# Patient Record
Sex: Female | Born: 1970
Health system: Southern US, Community
[De-identification: ages and names within clinical notes are randomized; demographics above are authoritative.]

## PROBLEM LIST (undated history)

## (undated) DIAGNOSIS — K200 Eosinophilic esophagitis: Secondary | ICD-10-CM

## (undated) DIAGNOSIS — C4490 Unspecified malignant neoplasm of skin, unspecified: Secondary | ICD-10-CM

## (undated) DIAGNOSIS — N809 Endometriosis, unspecified: Secondary | ICD-10-CM

## (undated) DIAGNOSIS — K589 Irritable bowel syndrome without diarrhea: Secondary | ICD-10-CM

## (undated) DIAGNOSIS — G8929 Other chronic pain: Secondary | ICD-10-CM

## (undated) DIAGNOSIS — K219 Gastro-esophageal reflux disease without esophagitis: Secondary | ICD-10-CM

## (undated) DIAGNOSIS — I2510 Atherosclerotic heart disease of native coronary artery without angina pectoris: Secondary | ICD-10-CM

## (undated) DIAGNOSIS — J189 Pneumonia, unspecified organism: Secondary | ICD-10-CM

## (undated) DIAGNOSIS — Z8619 Personal history of other infectious and parasitic diseases: Secondary | ICD-10-CM

## (undated) DIAGNOSIS — M549 Dorsalgia, unspecified: Secondary | ICD-10-CM

## (undated) DIAGNOSIS — Z9889 Other specified postprocedural states: Secondary | ICD-10-CM

## (undated) DIAGNOSIS — K449 Diaphragmatic hernia without obstruction or gangrene: Secondary | ICD-10-CM

## (undated) DIAGNOSIS — R112 Nausea with vomiting, unspecified: Secondary | ICD-10-CM

## (undated) DIAGNOSIS — Z800 Family history of malignant neoplasm of digestive organs: Secondary | ICD-10-CM

## (undated) DIAGNOSIS — F32A Depression, unspecified: Secondary | ICD-10-CM

## (undated) DIAGNOSIS — F419 Anxiety disorder, unspecified: Secondary | ICD-10-CM

## (undated) DIAGNOSIS — D649 Anemia, unspecified: Secondary | ICD-10-CM

## (undated) DIAGNOSIS — Z87442 Personal history of urinary calculi: Secondary | ICD-10-CM

## (undated) DIAGNOSIS — C4492 Squamous cell carcinoma of skin, unspecified: Secondary | ICD-10-CM

## (undated) DIAGNOSIS — K5909 Other constipation: Secondary | ICD-10-CM

## (undated) DIAGNOSIS — R519 Headache, unspecified: Secondary | ICD-10-CM

## (undated) DIAGNOSIS — F329 Major depressive disorder, single episode, unspecified: Secondary | ICD-10-CM

## (undated) HISTORY — DX: Gastro-esophageal reflux disease without esophagitis: K21.9

## (undated) HISTORY — DX: Irritable bowel syndrome, unspecified: K58.9

## (undated) HISTORY — DX: Eosinophilic esophagitis: K20.0

## (undated) HISTORY — PX: CHOLECYSTECTOMY: SHX55

## (undated) HISTORY — DX: Endometriosis, unspecified: N80.9

## (undated) HISTORY — DX: Depression, unspecified: F32.A

## (undated) HISTORY — DX: Family history of malignant neoplasm of digestive organs: Z80.0

## (undated) HISTORY — DX: Squamous cell carcinoma of skin, unspecified: C44.92

## (undated) HISTORY — PX: DILATION AND CURETTAGE, DIAGNOSTIC / THERAPEUTIC: SUR384

## (undated) HISTORY — DX: Major depressive disorder, single episode, unspecified: F32.9

## (undated) HISTORY — DX: Other chronic pain: G89.29

## (undated) HISTORY — DX: Dorsalgia, unspecified: M54.9

## (undated) HISTORY — PX: BACK SURGERY: SHX140

## (undated) HISTORY — DX: Other constipation: K59.09

## (undated) HISTORY — PX: LUMBAR FUSION: SHX111

## (undated) HISTORY — PX: PLACEMENT OF BREAST IMPLANTS: SHX6334

## (undated) HISTORY — PX: LAPAROSCOPY: SHX197

## (undated) HISTORY — DX: Personal history of other infectious and parasitic diseases: Z86.19

## (undated) HISTORY — DX: Diaphragmatic hernia without obstruction or gangrene: K44.9

## (undated) HISTORY — DX: Anxiety disorder, unspecified: F41.9

---

## 1997-12-13 ENCOUNTER — Ambulatory Visit (HOSPITAL_COMMUNITY): Admission: RE | Admit: 1997-12-13 | Discharge: 1997-12-13 | Payer: Self-pay | Admitting: Gastroenterology

## 1997-12-17 ENCOUNTER — Encounter: Admission: RE | Admit: 1997-12-17 | Discharge: 1998-03-17 | Payer: Self-pay | Admitting: Gastroenterology

## 1997-12-31 ENCOUNTER — Other Ambulatory Visit: Admission: RE | Admit: 1997-12-31 | Discharge: 1997-12-31 | Payer: Self-pay | Admitting: Obstetrics and Gynecology

## 1998-03-06 ENCOUNTER — Inpatient Hospital Stay (HOSPITAL_COMMUNITY): Admission: AD | Admit: 1998-03-06 | Discharge: 1998-03-06 | Payer: Self-pay | Admitting: Obstetrics & Gynecology

## 1998-05-27 ENCOUNTER — Encounter: Admission: RE | Admit: 1998-05-27 | Discharge: 1998-08-25 | Payer: Self-pay | Admitting: Gastroenterology

## 1999-04-14 ENCOUNTER — Other Ambulatory Visit: Admission: RE | Admit: 1999-04-14 | Discharge: 1999-04-14 | Payer: Self-pay | Admitting: Obstetrics and Gynecology

## 1999-06-27 ENCOUNTER — Encounter (INDEPENDENT_AMBULATORY_CARE_PROVIDER_SITE_OTHER): Payer: Self-pay | Admitting: *Deleted

## 1999-06-27 ENCOUNTER — Ambulatory Visit (HOSPITAL_BASED_OUTPATIENT_CLINIC_OR_DEPARTMENT_OTHER): Admission: RE | Admit: 1999-06-27 | Discharge: 1999-06-27 | Payer: Self-pay | Admitting: General Surgery

## 1999-10-28 ENCOUNTER — Emergency Department (HOSPITAL_COMMUNITY): Admission: EM | Admit: 1999-10-28 | Discharge: 1999-10-28 | Payer: Self-pay | Admitting: Emergency Medicine

## 2000-05-07 HISTORY — PX: CHOLECYSTECTOMY: SHX55

## 2000-12-23 ENCOUNTER — Other Ambulatory Visit: Admission: RE | Admit: 2000-12-23 | Discharge: 2000-12-23 | Payer: Self-pay | Admitting: Obstetrics and Gynecology

## 2000-12-27 ENCOUNTER — Ambulatory Visit (HOSPITAL_COMMUNITY): Admission: RE | Admit: 2000-12-27 | Discharge: 2000-12-27 | Payer: Self-pay | Admitting: Gastroenterology

## 2000-12-27 ENCOUNTER — Encounter (INDEPENDENT_AMBULATORY_CARE_PROVIDER_SITE_OTHER): Payer: Self-pay | Admitting: Specialist

## 2001-01-08 ENCOUNTER — Encounter: Payer: Self-pay | Admitting: Gastroenterology

## 2001-01-08 ENCOUNTER — Ambulatory Visit (HOSPITAL_COMMUNITY): Admission: RE | Admit: 2001-01-08 | Discharge: 2001-01-08 | Payer: Self-pay | Admitting: Gastroenterology

## 2002-01-13 ENCOUNTER — Encounter: Payer: Self-pay | Admitting: Gastroenterology

## 2002-01-13 ENCOUNTER — Ambulatory Visit (HOSPITAL_COMMUNITY): Admission: RE | Admit: 2002-01-13 | Discharge: 2002-01-13 | Payer: Self-pay | Admitting: Gastroenterology

## 2002-01-21 ENCOUNTER — Encounter: Payer: Self-pay | Admitting: Surgery

## 2002-01-22 ENCOUNTER — Encounter: Payer: Self-pay | Admitting: Surgery

## 2002-01-22 ENCOUNTER — Encounter (INDEPENDENT_AMBULATORY_CARE_PROVIDER_SITE_OTHER): Payer: Self-pay | Admitting: Specialist

## 2002-01-22 ENCOUNTER — Observation Stay (HOSPITAL_COMMUNITY): Admission: RE | Admit: 2002-01-22 | Discharge: 2002-01-23 | Payer: Self-pay | Admitting: Surgery

## 2002-06-09 ENCOUNTER — Other Ambulatory Visit: Admission: RE | Admit: 2002-06-09 | Discharge: 2002-06-09 | Payer: Self-pay | Admitting: Obstetrics and Gynecology

## 2004-01-04 ENCOUNTER — Ambulatory Visit (HOSPITAL_COMMUNITY): Admission: RE | Admit: 2004-01-04 | Discharge: 2004-01-04 | Payer: Self-pay | Admitting: Obstetrics and Gynecology

## 2004-11-25 ENCOUNTER — Ambulatory Visit (HOSPITAL_COMMUNITY): Admission: RE | Admit: 2004-11-25 | Discharge: 2004-11-25 | Payer: Self-pay | Admitting: Specialist

## 2005-01-27 ENCOUNTER — Emergency Department (HOSPITAL_COMMUNITY): Admission: EM | Admit: 2005-01-27 | Discharge: 2005-01-27 | Payer: Self-pay | Admitting: Emergency Medicine

## 2005-02-27 ENCOUNTER — Other Ambulatory Visit: Admission: RE | Admit: 2005-02-27 | Discharge: 2005-02-27 | Payer: Self-pay | Admitting: Gynecology

## 2005-07-06 ENCOUNTER — Ambulatory Visit (HOSPITAL_COMMUNITY): Admission: RE | Admit: 2005-07-06 | Discharge: 2005-07-06 | Payer: Self-pay | Admitting: Gynecology

## 2005-07-20 ENCOUNTER — Ambulatory Visit (HOSPITAL_BASED_OUTPATIENT_CLINIC_OR_DEPARTMENT_OTHER): Admission: RE | Admit: 2005-07-20 | Discharge: 2005-07-20 | Payer: Self-pay | Admitting: Obstetrics and Gynecology

## 2005-07-20 ENCOUNTER — Encounter (INDEPENDENT_AMBULATORY_CARE_PROVIDER_SITE_OTHER): Payer: Self-pay | Admitting: *Deleted

## 2005-07-27 ENCOUNTER — Inpatient Hospital Stay (HOSPITAL_COMMUNITY): Admission: AD | Admit: 2005-07-27 | Discharge: 2005-07-27 | Payer: Self-pay | Admitting: Obstetrics and Gynecology

## 2005-09-14 ENCOUNTER — Ambulatory Visit (HOSPITAL_COMMUNITY): Admission: RE | Admit: 2005-09-14 | Discharge: 2005-09-14 | Payer: Self-pay | Admitting: Obstetrics and Gynecology

## 2005-09-14 ENCOUNTER — Encounter (INDEPENDENT_AMBULATORY_CARE_PROVIDER_SITE_OTHER): Payer: Self-pay | Admitting: Specialist

## 2005-10-03 IMAGING — US US PELVIS COMPLETE MODIFY
1 series · 18 of 25 positions shown · non-contrast
Comparison: none

CLINICAL DATA: Evaluate for left ovarian cyst.  Heavy bleeding for 20 days.  History of ovarian cysts and endometriosis. 
TRANSABDOMINAL AND TRANSVAGINAL PELVIC ULTRASOUND 
Transabdominal and transvaginal scanning of the pelvis was performed.  The uterus is normal in size measuring 8.4 cm in length, 3.4 cm in depth, and 5.5 cm in width.  The myometrium is homogeneous.  The endometrium is normal at 3 mm.
Right ovary is normal measuring 2.3 x 1.2 x 1.5 cm.  Left ovary contains a simple cyst measuring approximately 2.9 x 2.8 x 2.7 cm.  Adjacent to this is a second simple cyst that could be a daughter cyst measuring 1.3 x 1.4 x 1.1 cm.  There are a few tiny daughter cysts along the wall of the larger cyst.  No internal echoes are noted.  No thick septations are seen.
IMPRESSION
Normal uterus and right ovary.  Several simple cysts in the left ovary as described above.

[Series 1: us pelvis complete modify · 18 of 74 slices shown]
[im 1/74]
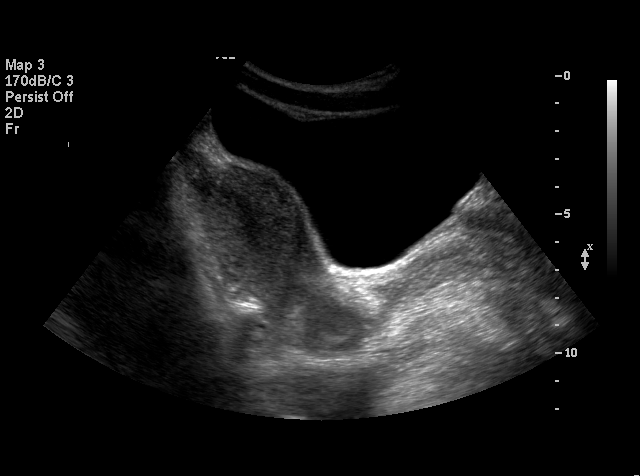
[im 7/74]
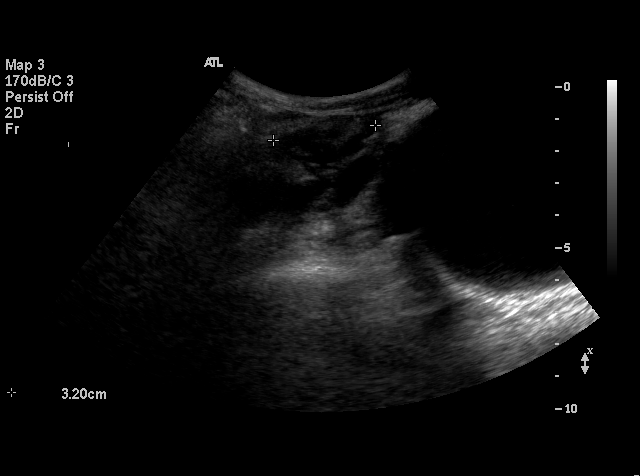
[im 10/74]
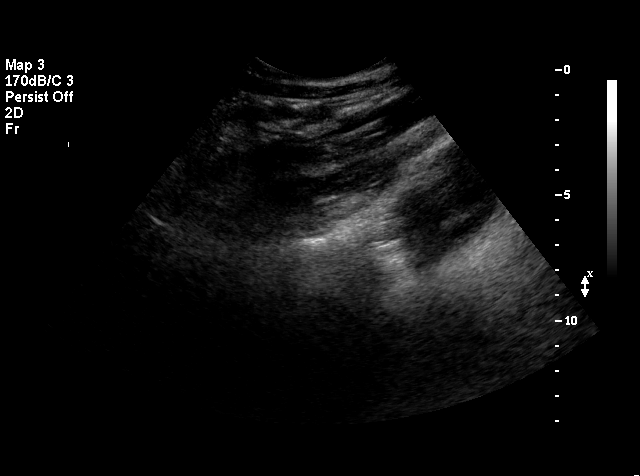
[im 13/74]
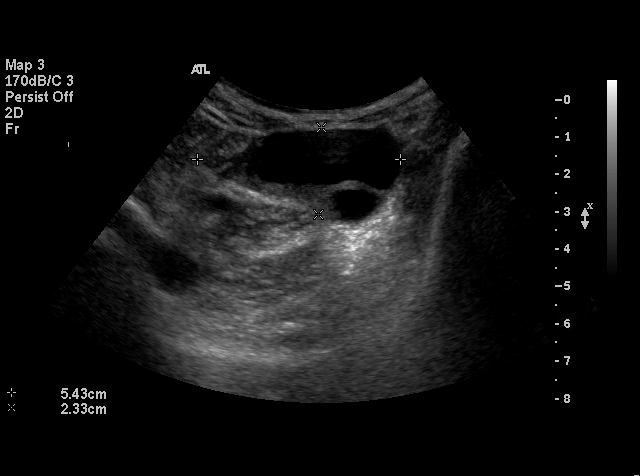
[im 19/74]
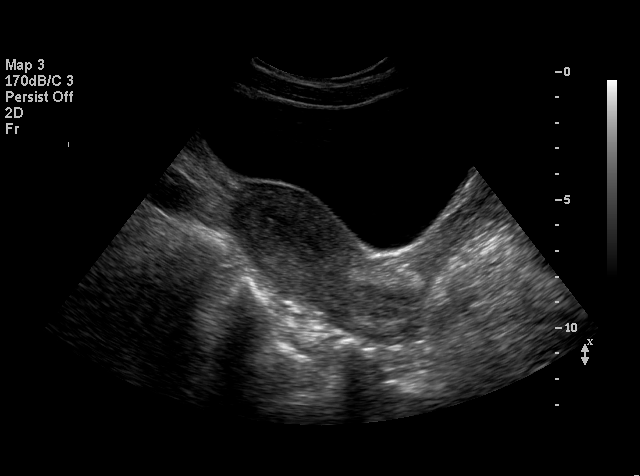
[im 22/74]
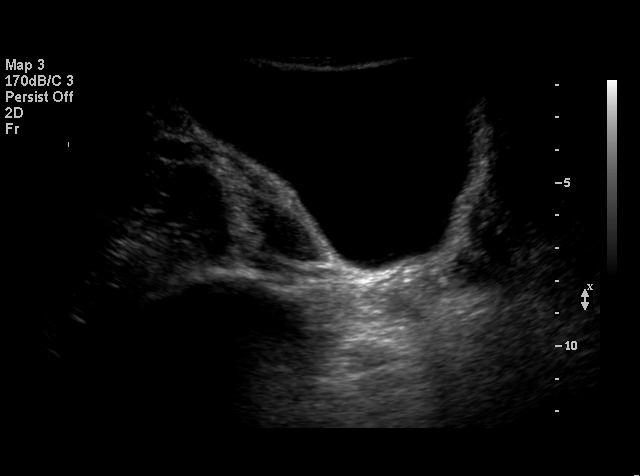
[im 28/74]
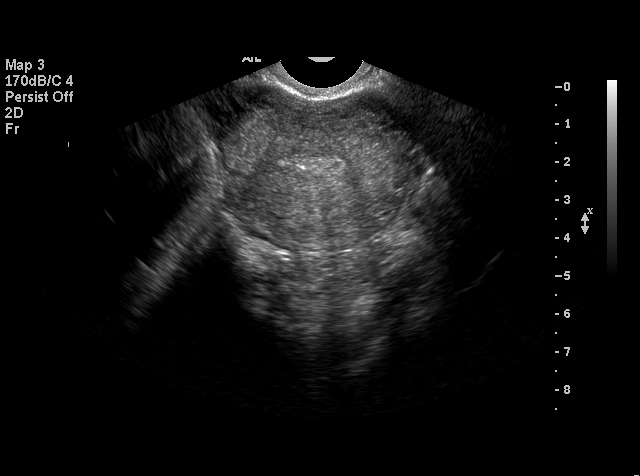
[im 31/74]
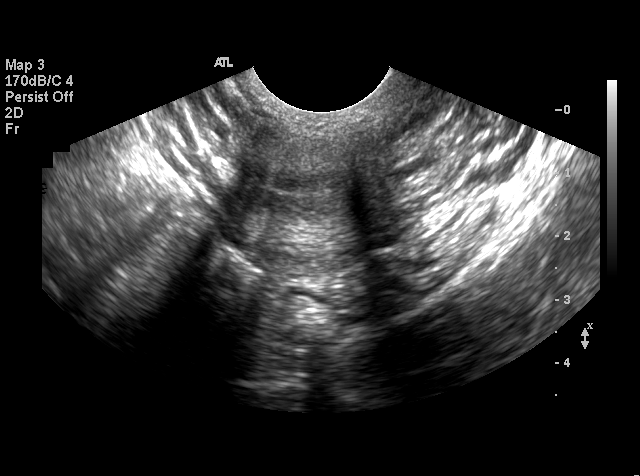
[im 34/74]
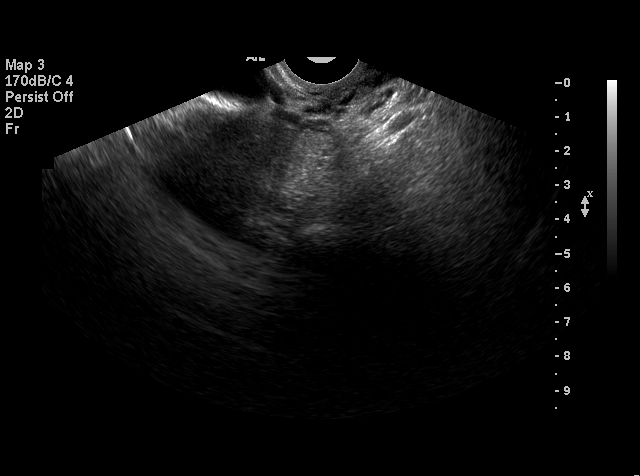
[im 40/74]
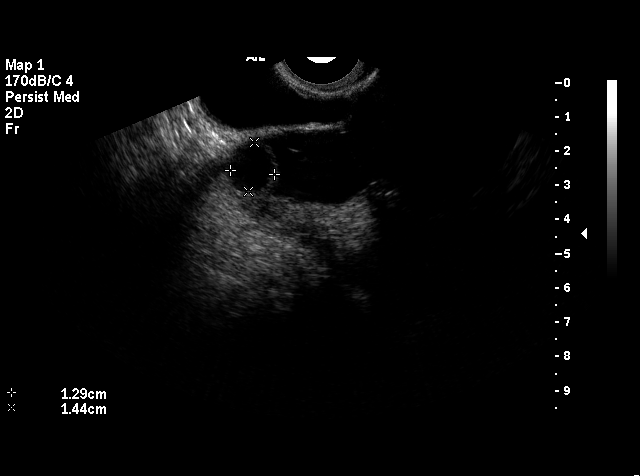
[im 43/74]
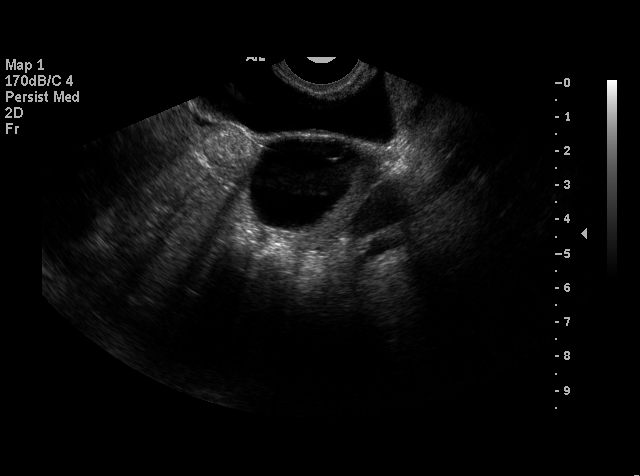
[im 46/74]
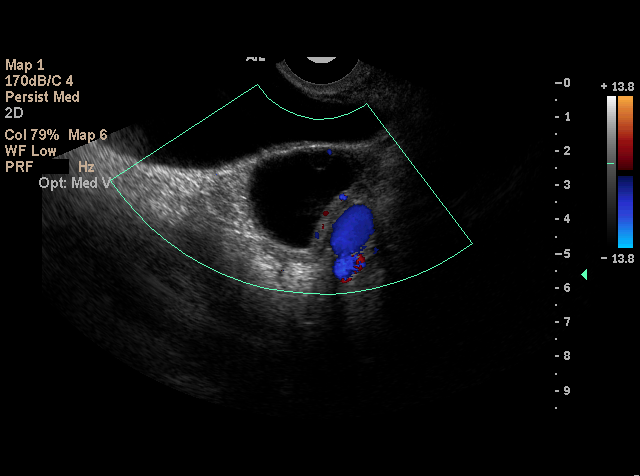
[im 52/74]
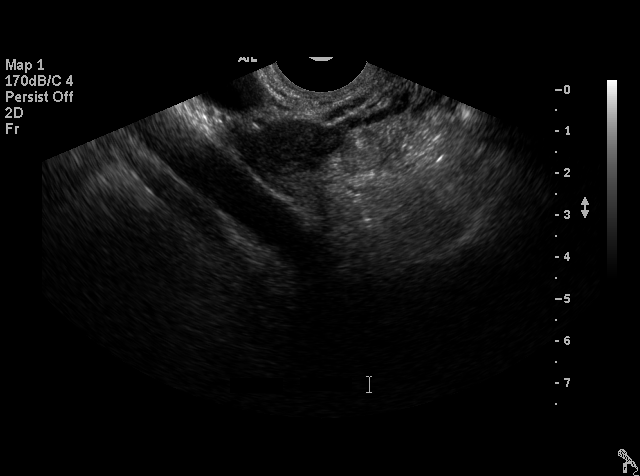
[im 55/74]
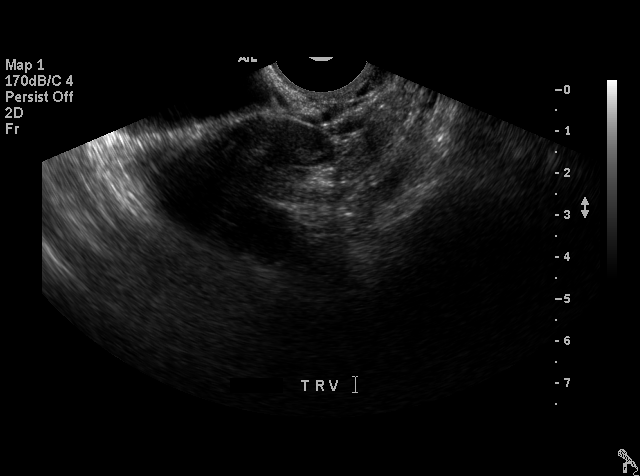
[im 61/74]
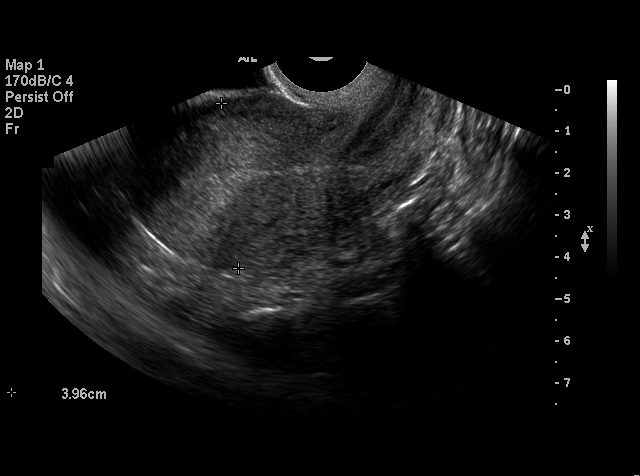
[im 64/74]
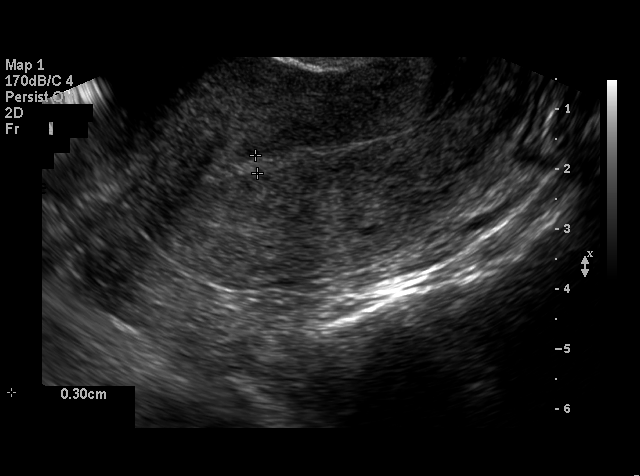
[im 67/74]
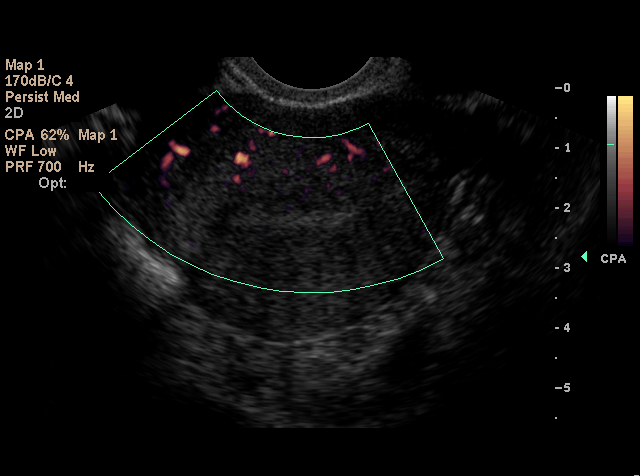
[im 74/74]
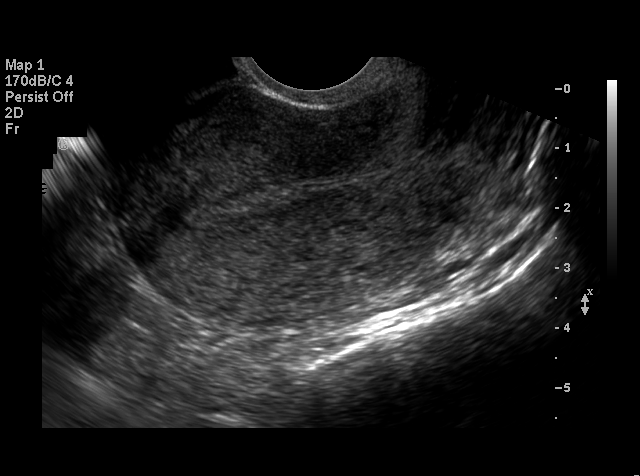

[18 of 25 positions shown; findings below may reference images not displayed]

## 2007-09-01 ENCOUNTER — Encounter (INDEPENDENT_AMBULATORY_CARE_PROVIDER_SITE_OTHER): Payer: Self-pay | Admitting: Obstetrics and Gynecology

## 2007-09-01 ENCOUNTER — Ambulatory Visit (HOSPITAL_COMMUNITY): Admission: RE | Admit: 2007-09-01 | Discharge: 2007-09-01 | Payer: Self-pay | Admitting: Obstetrics and Gynecology

## 2008-08-17 ENCOUNTER — Encounter: Admission: RE | Admit: 2008-08-17 | Discharge: 2008-08-17 | Payer: Self-pay | Admitting: Specialist

## 2008-09-03 ENCOUNTER — Encounter: Admission: RE | Admit: 2008-09-03 | Discharge: 2008-09-03 | Payer: Self-pay | Admitting: Specialist

## 2008-09-23 ENCOUNTER — Encounter: Admission: RE | Admit: 2008-09-23 | Discharge: 2008-09-23 | Payer: Self-pay | Admitting: Specialist

## 2010-05-28 ENCOUNTER — Encounter: Payer: Self-pay | Admitting: Gynecology

## 2010-05-28 ENCOUNTER — Encounter: Payer: Self-pay | Admitting: Specialist

## 2010-09-19 NOTE — Op Note (Signed)
[name], [name] NO.:  [id number]   MEDICAL RECORD NO.:  [id number]          PATIENT TYPE:  AMB   LOCATION:  SDC                           FACILITY:  [facility]   PHYSICIAN:  Malva Limes, M.D.    DATE OF BIRTH:  04-20-1971   DATE OF PROCEDURE:  09/01/2007  DATE OF DISCHARGE:                               OPERATIVE REPORT   PREOPERATIVE DIAGNOSIS:  Menometrorrhagia.   POSTOPERATIVE DIAGNOSIS:  Menometrorrhagia.   PROCEDURE:  Dilation and curettage.   SURGEON:  Malva Limes, M.D.   ANESTHESIA:  MAC with paracervical block.   DRAINS:  None.   ANTIBIOTIC:  Ancef 1 gm.   SPECIMENS:  Endometrial curettings sent to pathology.   COMPLICATIONS:  None.   ESTIMATED BLOOD LOSS:  Minimal.   DESCRIPTION OF PROCEDURE:  The patient was taken to the operating room  where she was placed in the dorsal lithotomy position.  A MAC anesthesia  was administered without complication.  She was then prepped and draped  in the usual fashion for this procedure.  Ten mL of 1% lidocaine was  used for a paracervical block.  The cervix was serially dilated to a 76-  [place].  The uterus was sounded to 8 cm.  The patient then had sharp  curettage performed.  The patient had no evidence of a polyp or  excessive tissue.  The patient was taken to the recovery room in stable  condition.  Instrument and lap counts were correct x1.           ______________________________  Malva Limes, M.D.     MA/MEDQ  D:  09/01/2007  T:  09/01/2007  Job:  [id number]

## 2010-09-22 NOTE — Op Note (Signed)
NAME:  Lisa [name], Lisa [name]                         ACCOUNT NO.:  [id number]   MEDICAL RECORD NO.:  [id number]                   PATIENT TYPE:  OBV   LOCATION:  0344                                 FACILITY:  [facility]   PHYSICIAN:  Douglas A. Magnus Ivan, M.D.           DATE OF BIRTH:  1971-02-20   DATE OF PROCEDURE:  DATE OF DISCHARGE:                                 OPERATIVE REPORT   PREOPERATIVE DIAGNOSIS:  Biliary diskinesia.   POSTOPERATIVE DIAGNOSIS:  Biliary diskinesia.   PROCEDURE:  Laparoscopic cholecystectomy with intraoperative cholangiogram.   SURGEON:  Douglas A. Magnus Ivan, M.D.   ASSISTANT:  Gabrielle Dare. Janee Morn, M.D.   ANESTHESIA:  General endotracheal.   FINDINGS:  The patient was found to have a normal cholangiogram.   DESCRIPTION OF PROCEDURE:  The patient was brought to the operating room,  identified as [name]. She was placed supine on the operating room  table and general anesthesia was induced. Her abdomen was then prepped and  draped in the usual sterile fashion. Using a #15 blade, a small transverse  incision was made below the umbilicus. The incision was carried down through  the fascia which was then opened with the scalpel. A hemostat was then used  to pass through the peritoneal cavity. A #0 Vicryl pursestring suture was  then placed around the fascial opening. The Hasson port was placed through  the opening and insufflation of the abdomen was begun. Next a 12 mm port was  placed in the patient's epigastrium, two 5 mm ports were placed in the  patient's right flank under direct vision. The gallbladder was then  identified and retracted above the liver bed. Dissection was then carried  out at the base of the gallbladder. The cystic duct was dissected out and  clipped once distally. It was then partly transected with the scissors. A  cholangiocath was then inserted through an Angiocath into the right upper  quadrant under direct vision. The cholangiocath  was then placed into the  open cystic duct. A cholangiogram was then performed under fluoroscopy.  Excellent flow of contrast was seen into the common bile duct up into the  proximal biliary radicles and into the duodenum without evidence of  abnormality. The cholangiocatheter was then removed and the cystic duct was  then clipped three times proximally and transected with the scissors. The  cystic artery was then identified, clipped twice proximally, once distally  and transected as well. The gallbladder was then dissected free from the  liver bed with the electrocautery. Hemostasis was achieved in the bed with  the cautery. The gallbladder was then completely excised and removed through  the incision at the umbilicus. The #0 at the umbilicus was tied closed  closing the fascial defect. A separate figure-of-eight #0 Vicryl suture was  also placed at the umbilical fascial defect. All ports were then removed  under direct vision and the abdomen was deflated. All  incisions were then  anesthetized with 0.25% Marcaine and closed with 4-0 Monocryl subcuticular  sutures. Steri-  Strips, gauze and tape were then applied. The patient tolerated the  procedure well. All sponge, needle and instrument counts were correct at the  end of the procedure. The patient was then extubated in the operating room  and taken in stable condition to the recovery room.                                               Douglas A. Magnus Ivan, M.D.    DAB/MEDQ  D:  01/22/2002  T:  01/22/2002  Job:  [id number]

## 2010-09-22 NOTE — Op Note (Signed)
[name], [name] NO.:  [id number]   MEDICAL RECORD NO.:  [id number]          PATIENT TYPE:  AMB   LOCATION:  [facility]                         FACILITY:  [facility]   PHYSICIAN:  Malva Limes, M.D.    DATE OF BIRTH:  01-11-1971   DATE OF PROCEDURE:  07/20/2005  DATE OF DISCHARGE:  07/20/2005                                 OPERATIVE REPORT   PREOPERATIVE DIAGNOSIS:  Painful ovarian cyst measuring 4 cm.   POSTOPERATIVE DIAGNOSIS:  Painful ovarian cyst measuring 4 cm.   PROCEDURE:  Diagnostic laparoscopy with left ovarian cystectomy.   SURGEON:  Malva Limes, M.D.   ANESTHESIA:  General endotracheal.   ANTIBIOTICS:  Ancef 1 gram.   DRAINS:  None.   ESTIMATED BLOOD LOSS:  Minimal.   COMPLICATIONS:  None.   SPECIMENS:  Cyst wall sent to pathology.   DESCRIPTION OF PROCEDURE:  The patient was taken to the operating room where  she was placed in dorsal supine position. A general anesthetic was  administered without complications. She was then placed in the dorsal  lithotomy position. She was prepped with Betadine and draped in the usual  fashion for this procedure. A Hulka tenaculum was applied to the anterior  cervical lip. The umbilicus was then injected with 0.25% Marcaine, a  vertical skin incision was made through the previous scar. The fascia was  grasped with Kocher's, entered with Mayo scissors, a parietoperitoneum was  entered sharply. An #0 Vicryl suture was placed in a pursestring fashion,  the Hasson cannula was placed into the peritoneal cavity, 3 liters of carbon  dioxide was insufflated. The patient was then placed in Trendelenburg. 5 mm  ports were placed under direct visualization in the left lower quadrant and  midline. At this point, the examination of the abdominal contents revealed  no evidence of endometriosis or adhesions. The patient had normal fallopian  tubes bilaterally, the uterus appeared to be normal, there was no evidence  of  endometriosis. The right ovary was normal, the left ovary had a 4-5 cm  cyst. At this point, the left ovarian ligament was grasped and incised with  a needle. Once this was performed, the ovarian cyst was shelled out;  however, during this procedure the cyst was ruptured and the cyst wall was  removed. The fluid was clear yellow serosanguineous fluid. There was no  evidence of any abnormalities on the cyst lining. There was no nodules  anywhere. Once the cyst wall was removed, the remaining pelvis again checked  and found to have no endometriosis. At this point, the procedure was  concluded. The instruments removed, the trocars removed, pneumoperitoneum  released. The fascia was closed with interrupted #0 Vicryl suture and the  skin with 4-0 Vicryl suture. Dermabond  was used for the 5 mm ports. The patient was extubated and taken to the  recovery room in stable condition. Instrument and lap counts were correct  x1. The patient will be discharged to home. She will followup in the office  in 4 weeks. She was sent home with Percocet to take p.r.n.  ______________________________  Malva Limes, M.D.     MA/MEDQ  D:  07/20/2005  T:  07/23/2005  Job:  [id number]   cc:   Leatha Gilding. Mezer, M.D.  Fax: [phone]

## 2010-09-22 NOTE — Op Note (Signed)
[place]  Patient:    Lisa [name], Lisa [name]                      MRN: [id number] Proc. Date: 06/27/99 Adm. Date:  [id number] Attending:  Janalyn Rouse                           Operative Report  PREOPERATIVE DIAGNOSIS:  Small breast mass, left.  POSTOPERATIVE DIAGNOSIS:  Small breast mass, left.  OPERATION:  Excision of left breast mass.  SURGEON:  Rose Phi. Maple Hudson, M.D.  ANESTHESIA:  General.  DESCRIPTION OF PROCEDURE:  The patient was placed on the operating table with the left arm extended on the arm board.  The left breast was prepped and draped in he usual fashion.  A palpable nodule just under the areola at the 11 oclock position on the left side.  A circumareolar incision was then made and using the cautery, I excised this palpable firm mass of the surrounding normal breast tissue. Hemostasis was obtained with the cautery.  We carefully injected 0.25% Marcaine. The deeper breast tissue was closed with 3-0 Vicryl, and the skin with subcuticular 4-0 Monocryl, and Steri-Strips.  A dressing applied.  The patient was transferred to the recovery room in satisfactory condition having tolerated the procedure well. DD:  06/27/99 TD:  06/28/99 Job: [id number]

## 2010-09-22 NOTE — Procedures (Signed)
[facility]  Patient:    Lisa [name], Lisa [name] Visit Number: [id number] MRN: [id number]          Service Type: END Location: ENDO Attending Physician:  Nelda Marseille Proc. Date: 12/27/00 Adm. Date:  12/27/2000   CC:         Dr. [facility]   Procedure Report  PROCEDURE:  Esophagogastroduodenoscopy with biopsy.  INDICATIONS FOR PROCEDURE:  A patient with multiple GI complaints, increased reflux.  Consent was signed after risks, benefits, methods, and options were thoroughly discussed in the office.  MEDICINES USED:  Demerol 100, Versed 15.  DESCRIPTION OF PROCEDURE:  The video endoscope was inserted by direct vision. The esophagus was normal. There was no signs of Barretts or esophagitis. In the distal esophagus was a very tiny and possibly a small hiatal hernia. The scope passed into the stomach, advanced through a normal antrum, normal pylorus into a normal duodenal bulb and around the C loop to a normal second portion of the duodenum. The scope was withdrawn back to the stomach which was evaluated on retroflexed and straight visualization with a good look at the cardia, fundus, angularis, lesser and greater curve. No signs of stomach abnormalities were seen, specifically no polyps, ulcers, or gastritis. No significant hiatal hernia was seen in the cardia. The scope was then slowly withdrawn back to 20 cm which confirmed the above findings in the esophagus. The scope was then advanced to the stomach and two biopsies of the antrum, two of the proximal stomach were obtained to rule out Helicobacter. We then went ahead and readvanced around the C loop and two biopsies of the duodenum were obtained and put in a second container. We then withdrew slowly back to the esophagus. Air was suctioned from the stomach and a few distal esophageal biopsies were obtained to rule out any microscopic abnormality. The scope was removed. The  patient tolerated the procedure well. There was on obvious or immediate complication.  ENDOSCOPIC DIAGNOSIS: 1. Tiny to small hiatal hernia. 2. Otherwise normal esophagogastroduodenoscopy status post distal esophageal    biopsies, stomach biopsies and duodenal biopsies to rule out any    microscopic abnormality.  PLAN:  Await pathology, continue Aciphex since that is helping, add Levbid. Follow-up p.r.n. or in 6-8 weeks to recheck symptoms and decide any other workup plans. Attending Physician:  Nelda Marseille DD:  12/27/00 TD:  12/27/00 Job: [id number]

## 2010-09-22 NOTE — Op Note (Signed)
Lisa [name], COUVILLON NO.:  [id number]   MEDICAL RECORD NO.:  [id number]          PATIENT TYPE:  AMB   LOCATION:  SDC                           FACILITY:  [facility]   PHYSICIAN:  Malva Limes, M.D.    DATE OF BIRTH:  [birth year]   DATE OF PROCEDURE:  09/14/2005  DATE OF DISCHARGE:                                 OPERATIVE REPORT   PREOPERATIVE DIAGNOSES:  1.  Menorrhagia.  2.  Endometrial polyps seen on sonohistogram.   POSTOPERATIVE DIAGNOSES:  1.  Menorrhagia.  2.  Endometrial polyps seen on sonohistogram.   PROCEDURE:   DILATION AND CURETTAGE SURGEON:  Dr. Dareen Piano.   ANESTHESIA:  MAC with paracervical block.   DRAINS:  None.   ANTIBIOTICS:  Ancef 1 g.   SPECIMENS:  Endometrial curettings to pathology.   ESTIMATED BLOOD LOSS:  Minimal.   COMPLICATIONS:  None.   PROCEDURE:  The patient was taken to the operating room where she was placed  in dorsal lithotomy position.  MAC anesthesia was administered without  complications.  She was then prepped with Betadine and draped in the usual  fashion for this procedure.  A sterile speculum was placed in the vagina.  Twenty cc of 1% lidocaine was used for paracervical block.  Single-tooth  tenaculum was applied to the anterior cervical lip.  The cervical os was  then serially dilated to a 27-French. Sharp curettage was placed into the  uterine cavity, and curettage performed. All quadrants were sharply  curetted.  Moderate amount of tissue was removed.  The patient tolerated the  procedure well.  She was taken to recovery room in stable condition.  Instrument and __________ counts were correct x1.  The patient will be  discharged home.  She will follow up in the office in four weeks.  She will  be given Advil to take as needed.          ______________________________  Malva Limes, M.D.    MA/MEDQ  D:  09/14/2005  T:  09/15/2005  Job:  [id number]

## 2011-01-30 LAB — CBC
HCT: 34.1 — ABNORMAL LOW
RDW: 12.7

## 2011-01-30 LAB — PREGNANCY, URINE: Preg Test, Ur: NEGATIVE

## 2011-07-10 ENCOUNTER — Other Ambulatory Visit: Payer: Self-pay | Admitting: Obstetrics and Gynecology

## 2012-10-11 DIAGNOSIS — F39 Unspecified mood [affective] disorder: Secondary | ICD-10-CM

## 2012-10-11 HISTORY — DX: Unspecified mood (affective) disorder: F39

## 2013-06-09 DIAGNOSIS — M519 Unspecified thoracic, thoracolumbar and lumbosacral intervertebral disc disorder: Secondary | ICD-10-CM | POA: Insufficient documentation

## 2013-06-09 DIAGNOSIS — M259 Joint disorder, unspecified: Secondary | ICD-10-CM | POA: Insufficient documentation

## 2013-06-09 DIAGNOSIS — F329 Major depressive disorder, single episode, unspecified: Secondary | ICD-10-CM

## 2013-06-09 HISTORY — DX: Major depressive disorder, single episode, unspecified: F32.9

## 2013-06-09 HISTORY — DX: Joint disorder, unspecified: M25.9

## 2013-06-09 HISTORY — DX: Unspecified thoracic, thoracolumbar and lumbosacral intervertebral disc disorder: M51.9

## 2013-07-16 ENCOUNTER — Other Ambulatory Visit: Payer: Self-pay | Admitting: Obstetrics and Gynecology

## 2013-08-04 DIAGNOSIS — I499 Cardiac arrhythmia, unspecified: Secondary | ICD-10-CM | POA: Insufficient documentation

## 2013-08-04 DIAGNOSIS — M5416 Radiculopathy, lumbar region: Secondary | ICD-10-CM

## 2013-08-04 DIAGNOSIS — M533 Sacrococcygeal disorders, not elsewhere classified: Secondary | ICD-10-CM | POA: Insufficient documentation

## 2013-08-04 HISTORY — DX: Cardiac arrhythmia, unspecified: I49.9

## 2013-08-04 HISTORY — DX: Radiculopathy, lumbar region: M54.16

## 2013-08-04 HISTORY — DX: Sacrococcygeal disorders, not elsewhere classified: M53.3

## 2013-10-09 DIAGNOSIS — M961 Postlaminectomy syndrome, not elsewhere classified: Secondary | ICD-10-CM

## 2013-10-09 HISTORY — DX: Postlaminectomy syndrome, not elsewhere classified: M96.1

## 2015-03-21 DIAGNOSIS — K5903 Drug induced constipation: Secondary | ICD-10-CM | POA: Insufficient documentation

## 2015-03-21 DIAGNOSIS — T402X5A Adverse effect of other opioids, initial encounter: Secondary | ICD-10-CM

## 2015-03-21 HISTORY — DX: Adverse effect of other opioids, initial encounter: T40.2X5A

## 2015-05-15 DIAGNOSIS — M84321A Stress fracture, right humerus, initial encounter for fracture: Secondary | ICD-10-CM | POA: Diagnosis not present

## 2015-05-16 DIAGNOSIS — S52502A Unspecified fracture of the lower end of left radius, initial encounter for closed fracture: Secondary | ICD-10-CM | POA: Diagnosis not present

## 2015-05-16 DIAGNOSIS — M5137 Other intervertebral disc degeneration, lumbosacral region: Secondary | ICD-10-CM | POA: Diagnosis not present

## 2015-05-16 DIAGNOSIS — M5416 Radiculopathy, lumbar region: Secondary | ICD-10-CM | POA: Diagnosis not present

## 2015-05-16 DIAGNOSIS — M533 Sacrococcygeal disorders, not elsewhere classified: Secondary | ICD-10-CM | POA: Diagnosis not present

## 2015-05-16 DIAGNOSIS — M961 Postlaminectomy syndrome, not elsewhere classified: Secondary | ICD-10-CM | POA: Diagnosis not present

## 2015-05-23 DIAGNOSIS — S52502D Unspecified fracture of the lower end of left radius, subsequent encounter for closed fracture with routine healing: Secondary | ICD-10-CM | POA: Diagnosis not present

## 2015-05-23 DIAGNOSIS — M25532 Pain in left wrist: Secondary | ICD-10-CM | POA: Diagnosis not present

## 2015-05-27 DIAGNOSIS — R197 Diarrhea, unspecified: Secondary | ICD-10-CM | POA: Diagnosis not present

## 2015-05-27 DIAGNOSIS — N912 Amenorrhea, unspecified: Secondary | ICD-10-CM | POA: Diagnosis not present

## 2015-05-27 DIAGNOSIS — F339 Major depressive disorder, recurrent, unspecified: Secondary | ICD-10-CM | POA: Diagnosis not present

## 2015-06-16 DIAGNOSIS — S52502D Unspecified fracture of the lower end of left radius, subsequent encounter for closed fracture with routine healing: Secondary | ICD-10-CM | POA: Diagnosis not present

## 2015-06-21 DIAGNOSIS — N912 Amenorrhea, unspecified: Secondary | ICD-10-CM | POA: Diagnosis not present

## 2015-07-05 DIAGNOSIS — W000XXD Fall on same level due to ice and snow, subsequent encounter: Secondary | ICD-10-CM | POA: Diagnosis not present

## 2015-07-05 DIAGNOSIS — M25631 Stiffness of right wrist, not elsewhere classified: Secondary | ICD-10-CM | POA: Diagnosis not present

## 2015-07-05 DIAGNOSIS — M25431 Effusion, right wrist: Secondary | ICD-10-CM | POA: Diagnosis not present

## 2015-07-05 DIAGNOSIS — M79641 Pain in right hand: Secondary | ICD-10-CM | POA: Diagnosis not present

## 2015-07-05 DIAGNOSIS — S52591D Other fractures of lower end of right radius, subsequent encounter for closed fracture with routine healing: Secondary | ICD-10-CM | POA: Diagnosis not present

## 2015-07-05 DIAGNOSIS — M6281 Muscle weakness (generalized): Secondary | ICD-10-CM | POA: Diagnosis not present

## 2015-07-11 DIAGNOSIS — M6281 Muscle weakness (generalized): Secondary | ICD-10-CM | POA: Diagnosis not present

## 2015-07-11 DIAGNOSIS — M25631 Stiffness of right wrist, not elsewhere classified: Secondary | ICD-10-CM | POA: Diagnosis not present

## 2015-07-11 DIAGNOSIS — W000XXD Fall on same level due to ice and snow, subsequent encounter: Secondary | ICD-10-CM | POA: Diagnosis not present

## 2015-07-11 DIAGNOSIS — M79641 Pain in right hand: Secondary | ICD-10-CM | POA: Diagnosis not present

## 2015-07-11 DIAGNOSIS — S52591D Other fractures of lower end of right radius, subsequent encounter for closed fracture with routine healing: Secondary | ICD-10-CM | POA: Diagnosis not present

## 2015-07-11 DIAGNOSIS — M25431 Effusion, right wrist: Secondary | ICD-10-CM | POA: Diagnosis not present

## 2015-07-12 DIAGNOSIS — M719 Bursopathy, unspecified: Secondary | ICD-10-CM | POA: Insufficient documentation

## 2015-07-12 DIAGNOSIS — M5137 Other intervertebral disc degeneration, lumbosacral region: Secondary | ICD-10-CM | POA: Diagnosis not present

## 2015-07-12 DIAGNOSIS — M533 Sacrococcygeal disorders, not elsewhere classified: Secondary | ICD-10-CM | POA: Diagnosis not present

## 2015-07-12 DIAGNOSIS — M961 Postlaminectomy syndrome, not elsewhere classified: Secondary | ICD-10-CM | POA: Diagnosis not present

## 2015-07-12 HISTORY — DX: Bursopathy, unspecified: M71.9

## 2015-07-13 DIAGNOSIS — M6281 Muscle weakness (generalized): Secondary | ICD-10-CM | POA: Diagnosis not present

## 2015-07-13 DIAGNOSIS — S52591D Other fractures of lower end of right radius, subsequent encounter for closed fracture with routine healing: Secondary | ICD-10-CM | POA: Diagnosis not present

## 2015-07-13 DIAGNOSIS — M25631 Stiffness of right wrist, not elsewhere classified: Secondary | ICD-10-CM | POA: Diagnosis not present

## 2015-07-13 DIAGNOSIS — M25431 Effusion, right wrist: Secondary | ICD-10-CM | POA: Diagnosis not present

## 2015-07-13 DIAGNOSIS — M79641 Pain in right hand: Secondary | ICD-10-CM | POA: Diagnosis not present

## 2015-07-13 DIAGNOSIS — W000XXD Fall on same level due to ice and snow, subsequent encounter: Secondary | ICD-10-CM | POA: Diagnosis not present

## 2015-07-14 DIAGNOSIS — M5416 Radiculopathy, lumbar region: Secondary | ICD-10-CM | POA: Diagnosis not present

## 2015-07-14 DIAGNOSIS — M654 Radial styloid tenosynovitis [de Quervain]: Secondary | ICD-10-CM | POA: Diagnosis not present

## 2015-07-14 DIAGNOSIS — S52502D Unspecified fracture of the lower end of left radius, subsequent encounter for closed fracture with routine healing: Secondary | ICD-10-CM | POA: Diagnosis not present

## 2015-07-14 DIAGNOSIS — Z5181 Encounter for therapeutic drug level monitoring: Secondary | ICD-10-CM | POA: Diagnosis not present

## 2015-07-14 DIAGNOSIS — M5137 Other intervertebral disc degeneration, lumbosacral region: Secondary | ICD-10-CM | POA: Diagnosis not present

## 2015-07-15 DIAGNOSIS — M6281 Muscle weakness (generalized): Secondary | ICD-10-CM | POA: Diagnosis not present

## 2015-07-15 DIAGNOSIS — S52591D Other fractures of lower end of right radius, subsequent encounter for closed fracture with routine healing: Secondary | ICD-10-CM | POA: Diagnosis not present

## 2015-07-15 DIAGNOSIS — M79641 Pain in right hand: Secondary | ICD-10-CM | POA: Diagnosis not present

## 2015-07-15 DIAGNOSIS — M25631 Stiffness of right wrist, not elsewhere classified: Secondary | ICD-10-CM | POA: Diagnosis not present

## 2015-07-15 DIAGNOSIS — M25431 Effusion, right wrist: Secondary | ICD-10-CM | POA: Diagnosis not present

## 2015-07-15 DIAGNOSIS — W000XXD Fall on same level due to ice and snow, subsequent encounter: Secondary | ICD-10-CM | POA: Diagnosis not present

## 2015-07-27 DIAGNOSIS — M719 Bursopathy, unspecified: Secondary | ICD-10-CM | POA: Diagnosis not present

## 2015-08-01 DIAGNOSIS — M6281 Muscle weakness (generalized): Secondary | ICD-10-CM | POA: Diagnosis not present

## 2015-08-01 DIAGNOSIS — M25431 Effusion, right wrist: Secondary | ICD-10-CM | POA: Diagnosis not present

## 2015-08-01 DIAGNOSIS — M25631 Stiffness of right wrist, not elsewhere classified: Secondary | ICD-10-CM | POA: Diagnosis not present

## 2015-08-01 DIAGNOSIS — W000XXD Fall on same level due to ice and snow, subsequent encounter: Secondary | ICD-10-CM | POA: Diagnosis not present

## 2015-08-01 DIAGNOSIS — M79641 Pain in right hand: Secondary | ICD-10-CM | POA: Diagnosis not present

## 2015-08-01 DIAGNOSIS — S52591D Other fractures of lower end of right radius, subsequent encounter for closed fracture with routine healing: Secondary | ICD-10-CM | POA: Diagnosis not present

## 2015-08-04 DIAGNOSIS — M25531 Pain in right wrist: Secondary | ICD-10-CM | POA: Diagnosis not present

## 2015-08-04 DIAGNOSIS — S52502D Unspecified fracture of the lower end of left radius, subsequent encounter for closed fracture with routine healing: Secondary | ICD-10-CM | POA: Diagnosis not present

## 2015-08-17 DIAGNOSIS — M25531 Pain in right wrist: Secondary | ICD-10-CM | POA: Diagnosis not present

## 2015-08-22 DIAGNOSIS — M25531 Pain in right wrist: Secondary | ICD-10-CM | POA: Diagnosis not present

## 2015-09-09 DIAGNOSIS — M51369 Other intervertebral disc degeneration, lumbar region without mention of lumbar back pain or lower extremity pain: Secondary | ICD-10-CM | POA: Insufficient documentation

## 2015-09-09 DIAGNOSIS — M961 Postlaminectomy syndrome, not elsewhere classified: Secondary | ICD-10-CM | POA: Diagnosis not present

## 2015-09-09 DIAGNOSIS — M533 Sacrococcygeal disorders, not elsewhere classified: Secondary | ICD-10-CM

## 2015-09-09 DIAGNOSIS — M5136 Other intervertebral disc degeneration, lumbar region: Secondary | ICD-10-CM | POA: Insufficient documentation

## 2015-09-09 HISTORY — DX: Sacrococcygeal disorders, not elsewhere classified: M53.3

## 2015-09-23 DIAGNOSIS — S39012A Strain of muscle, fascia and tendon of lower back, initial encounter: Secondary | ICD-10-CM | POA: Diagnosis not present

## 2015-09-23 DIAGNOSIS — S7002XA Contusion of left hip, initial encounter: Secondary | ICD-10-CM | POA: Diagnosis not present

## 2015-09-23 DIAGNOSIS — S3992XA Unspecified injury of lower back, initial encounter: Secondary | ICD-10-CM | POA: Diagnosis not present

## 2015-09-23 DIAGNOSIS — M25552 Pain in left hip: Secondary | ICD-10-CM | POA: Diagnosis not present

## 2015-09-23 DIAGNOSIS — M542 Cervicalgia: Secondary | ICD-10-CM | POA: Diagnosis not present

## 2015-09-23 DIAGNOSIS — Z87891 Personal history of nicotine dependence: Secondary | ICD-10-CM | POA: Diagnosis not present

## 2015-09-23 DIAGNOSIS — F329 Major depressive disorder, single episode, unspecified: Secondary | ICD-10-CM | POA: Diagnosis not present

## 2015-09-23 DIAGNOSIS — S161XXA Strain of muscle, fascia and tendon at neck level, initial encounter: Secondary | ICD-10-CM | POA: Diagnosis not present

## 2015-09-23 DIAGNOSIS — Z79899 Other long term (current) drug therapy: Secondary | ICD-10-CM | POA: Diagnosis not present

## 2015-10-04 DIAGNOSIS — G43839 Menstrual migraine, intractable, without status migrainosus: Secondary | ICD-10-CM | POA: Diagnosis not present

## 2015-10-04 DIAGNOSIS — G43719 Chronic migraine without aura, intractable, without status migrainosus: Secondary | ICD-10-CM | POA: Diagnosis not present

## 2015-10-04 DIAGNOSIS — G518 Other disorders of facial nerve: Secondary | ICD-10-CM | POA: Diagnosis not present

## 2015-10-04 DIAGNOSIS — R51 Headache: Secondary | ICD-10-CM | POA: Diagnosis not present

## 2015-10-04 DIAGNOSIS — M542 Cervicalgia: Secondary | ICD-10-CM | POA: Diagnosis not present

## 2015-10-04 DIAGNOSIS — G43019 Migraine without aura, intractable, without status migrainosus: Secondary | ICD-10-CM | POA: Diagnosis not present

## 2015-10-04 DIAGNOSIS — M791 Myalgia: Secondary | ICD-10-CM | POA: Diagnosis not present

## 2015-10-05 ENCOUNTER — Other Ambulatory Visit: Payer: Self-pay | Admitting: Obstetrics and Gynecology

## 2015-10-05 DIAGNOSIS — N63 Unspecified lump in unspecified breast: Secondary | ICD-10-CM

## 2015-10-11 ENCOUNTER — Ambulatory Visit
Admission: RE | Admit: 2015-10-11 | Discharge: 2015-10-11 | Disposition: A | Discharge status: Home / Self Care | Payer: PPO | Source: Ambulatory Visit | Attending: Obstetrics and Gynecology | Admitting: Obstetrics and Gynecology

## 2015-10-11 ENCOUNTER — Other Ambulatory Visit: Payer: Self-pay | Admitting: Obstetrics and Gynecology

## 2015-10-11 DIAGNOSIS — N63 Unspecified lump in unspecified breast: Secondary | ICD-10-CM

## 2015-10-12 DIAGNOSIS — M5106 Intervertebral disc disorders with myelopathy, lumbar region: Secondary | ICD-10-CM | POA: Diagnosis not present

## 2015-10-12 DIAGNOSIS — G894 Chronic pain syndrome: Secondary | ICD-10-CM | POA: Diagnosis not present

## 2015-11-07 ENCOUNTER — Other Ambulatory Visit: Payer: Self-pay | Admitting: Obstetrics and Gynecology

## 2015-11-07 DIAGNOSIS — Z124 Encounter for screening for malignant neoplasm of cervix: Secondary | ICD-10-CM | POA: Diagnosis not present

## 2015-11-07 DIAGNOSIS — Z01419 Encounter for gynecological examination (general) (routine) without abnormal findings: Secondary | ICD-10-CM | POA: Diagnosis not present

## 2015-11-09 DIAGNOSIS — G43839 Menstrual migraine, intractable, without status migrainosus: Secondary | ICD-10-CM | POA: Diagnosis not present

## 2015-11-09 DIAGNOSIS — R51 Headache: Secondary | ICD-10-CM | POA: Diagnosis not present

## 2015-11-09 DIAGNOSIS — M542 Cervicalgia: Secondary | ICD-10-CM | POA: Diagnosis not present

## 2015-11-09 DIAGNOSIS — G43019 Migraine without aura, intractable, without status migrainosus: Secondary | ICD-10-CM | POA: Diagnosis not present

## 2015-11-09 DIAGNOSIS — M961 Postlaminectomy syndrome, not elsewhere classified: Secondary | ICD-10-CM | POA: Diagnosis not present

## 2015-11-09 DIAGNOSIS — G43719 Chronic migraine without aura, intractable, without status migrainosus: Secondary | ICD-10-CM | POA: Diagnosis not present

## 2015-11-09 DIAGNOSIS — M533 Sacrococcygeal disorders, not elsewhere classified: Secondary | ICD-10-CM | POA: Diagnosis not present

## 2015-11-09 DIAGNOSIS — M5136 Other intervertebral disc degeneration, lumbar region: Secondary | ICD-10-CM | POA: Diagnosis not present

## 2015-11-09 DIAGNOSIS — G518 Other disorders of facial nerve: Secondary | ICD-10-CM | POA: Diagnosis not present

## 2015-11-09 DIAGNOSIS — M791 Myalgia: Secondary | ICD-10-CM | POA: Diagnosis not present

## 2015-11-09 LAB — CYTOLOGY - PAP

## 2015-11-15 DIAGNOSIS — M5106 Intervertebral disc disorders with myelopathy, lumbar region: Secondary | ICD-10-CM | POA: Diagnosis not present

## 2015-11-15 DIAGNOSIS — M7552 Bursitis of left shoulder: Secondary | ICD-10-CM | POA: Diagnosis not present

## 2015-11-15 DIAGNOSIS — M7061 Trochanteric bursitis, right hip: Secondary | ICD-10-CM | POA: Diagnosis not present

## 2015-11-15 DIAGNOSIS — M542 Cervicalgia: Secondary | ICD-10-CM | POA: Diagnosis not present

## 2015-11-15 DIAGNOSIS — G894 Chronic pain syndrome: Secondary | ICD-10-CM | POA: Diagnosis not present

## 2015-11-17 DIAGNOSIS — M79644 Pain in right finger(s): Secondary | ICD-10-CM | POA: Diagnosis not present

## 2015-11-17 DIAGNOSIS — M25531 Pain in right wrist: Secondary | ICD-10-CM | POA: Diagnosis not present

## 2016-01-11 DIAGNOSIS — M533 Sacrococcygeal disorders, not elsewhere classified: Secondary | ICD-10-CM | POA: Diagnosis not present

## 2016-01-11 DIAGNOSIS — M5136 Other intervertebral disc degeneration, lumbar region: Secondary | ICD-10-CM | POA: Diagnosis not present

## 2016-01-11 DIAGNOSIS — M961 Postlaminectomy syndrome, not elsewhere classified: Secondary | ICD-10-CM | POA: Diagnosis not present

## 2016-01-11 DIAGNOSIS — M5416 Radiculopathy, lumbar region: Secondary | ICD-10-CM | POA: Diagnosis not present

## 2016-02-23 DIAGNOSIS — M533 Sacrococcygeal disorders, not elsewhere classified: Secondary | ICD-10-CM | POA: Diagnosis not present

## 2016-02-23 DIAGNOSIS — M961 Postlaminectomy syndrome, not elsewhere classified: Secondary | ICD-10-CM | POA: Diagnosis not present

## 2016-02-24 DIAGNOSIS — R300 Dysuria: Secondary | ICD-10-CM | POA: Diagnosis not present

## 2016-02-24 DIAGNOSIS — K591 Functional diarrhea: Secondary | ICD-10-CM | POA: Diagnosis not present

## 2016-02-24 DIAGNOSIS — N3001 Acute cystitis with hematuria: Secondary | ICD-10-CM | POA: Diagnosis not present

## 2016-02-29 DIAGNOSIS — R350 Frequency of micturition: Secondary | ICD-10-CM | POA: Diagnosis not present

## 2016-02-29 DIAGNOSIS — R3915 Urgency of urination: Secondary | ICD-10-CM | POA: Diagnosis not present

## 2016-02-29 DIAGNOSIS — N3000 Acute cystitis without hematuria: Secondary | ICD-10-CM | POA: Diagnosis not present

## 2016-03-06 DIAGNOSIS — Z8619 Personal history of other infectious and parasitic diseases: Secondary | ICD-10-CM | POA: Diagnosis not present

## 2016-03-06 DIAGNOSIS — Z800 Family history of malignant neoplasm of digestive organs: Secondary | ICD-10-CM | POA: Diagnosis not present

## 2016-03-07 DIAGNOSIS — M5136 Other intervertebral disc degeneration, lumbar region: Secondary | ICD-10-CM | POA: Diagnosis not present

## 2016-03-07 DIAGNOSIS — M533 Sacrococcygeal disorders, not elsewhere classified: Secondary | ICD-10-CM | POA: Diagnosis not present

## 2016-03-07 DIAGNOSIS — M5416 Radiculopathy, lumbar region: Secondary | ICD-10-CM | POA: Diagnosis not present

## 2016-03-07 DIAGNOSIS — M961 Postlaminectomy syndrome, not elsewhere classified: Secondary | ICD-10-CM | POA: Diagnosis not present

## 2016-03-08 DIAGNOSIS — A09 Infectious gastroenteritis and colitis, unspecified: Secondary | ICD-10-CM | POA: Diagnosis not present

## 2016-03-08 DIAGNOSIS — N3020 Other chronic cystitis without hematuria: Secondary | ICD-10-CM | POA: Diagnosis not present

## 2016-03-08 DIAGNOSIS — N318 Other neuromuscular dysfunction of bladder: Secondary | ICD-10-CM | POA: Diagnosis not present

## 2016-03-27 DIAGNOSIS — G43019 Migraine without aura, intractable, without status migrainosus: Secondary | ICD-10-CM | POA: Diagnosis not present

## 2016-03-27 DIAGNOSIS — R51 Headache: Secondary | ICD-10-CM | POA: Diagnosis not present

## 2016-03-27 DIAGNOSIS — G518 Other disorders of facial nerve: Secondary | ICD-10-CM | POA: Diagnosis not present

## 2016-03-27 DIAGNOSIS — G43719 Chronic migraine without aura, intractable, without status migrainosus: Secondary | ICD-10-CM | POA: Diagnosis not present

## 2016-03-27 DIAGNOSIS — M791 Myalgia: Secondary | ICD-10-CM | POA: Diagnosis not present

## 2016-03-27 DIAGNOSIS — M542 Cervicalgia: Secondary | ICD-10-CM | POA: Diagnosis not present

## 2016-04-02 DIAGNOSIS — A09 Infectious gastroenteritis and colitis, unspecified: Secondary | ICD-10-CM | POA: Diagnosis not present

## 2016-05-16 DIAGNOSIS — M961 Postlaminectomy syndrome, not elsewhere classified: Secondary | ICD-10-CM | POA: Diagnosis not present

## 2016-05-16 DIAGNOSIS — M533 Sacrococcygeal disorders, not elsewhere classified: Secondary | ICD-10-CM | POA: Diagnosis not present

## 2016-05-16 DIAGNOSIS — M5416 Radiculopathy, lumbar region: Secondary | ICD-10-CM | POA: Diagnosis not present

## 2016-05-16 DIAGNOSIS — M5136 Other intervertebral disc degeneration, lumbar region: Secondary | ICD-10-CM | POA: Diagnosis not present

## 2016-06-21 DIAGNOSIS — R399 Unspecified symptoms and signs involving the genitourinary system: Secondary | ICD-10-CM | POA: Diagnosis not present

## 2016-06-21 DIAGNOSIS — N950 Postmenopausal bleeding: Secondary | ICD-10-CM | POA: Diagnosis not present

## 2016-06-27 DIAGNOSIS — G43019 Migraine without aura, intractable, without status migrainosus: Secondary | ICD-10-CM | POA: Diagnosis not present

## 2016-06-27 DIAGNOSIS — G43719 Chronic migraine without aura, intractable, without status migrainosus: Secondary | ICD-10-CM | POA: Diagnosis not present

## 2016-06-27 DIAGNOSIS — G43839 Menstrual migraine, intractable, without status migrainosus: Secondary | ICD-10-CM | POA: Diagnosis not present

## 2016-07-13 DIAGNOSIS — M961 Postlaminectomy syndrome, not elsewhere classified: Secondary | ICD-10-CM | POA: Diagnosis not present

## 2016-07-13 DIAGNOSIS — M533 Sacrococcygeal disorders, not elsewhere classified: Secondary | ICD-10-CM | POA: Diagnosis not present

## 2016-07-13 DIAGNOSIS — G894 Chronic pain syndrome: Secondary | ICD-10-CM | POA: Diagnosis not present

## 2016-07-13 DIAGNOSIS — Z5181 Encounter for therapeutic drug level monitoring: Secondary | ICD-10-CM | POA: Diagnosis not present

## 2016-07-13 DIAGNOSIS — M5136 Other intervertebral disc degeneration, lumbar region: Secondary | ICD-10-CM | POA: Diagnosis not present

## 2016-08-10 DIAGNOSIS — K5900 Constipation, unspecified: Secondary | ICD-10-CM | POA: Diagnosis not present

## 2016-08-10 DIAGNOSIS — Z800 Family history of malignant neoplasm of digestive organs: Secondary | ICD-10-CM | POA: Diagnosis not present

## 2016-08-17 DIAGNOSIS — Z1211 Encounter for screening for malignant neoplasm of colon: Secondary | ICD-10-CM | POA: Diagnosis not present

## 2016-08-17 DIAGNOSIS — K641 Second degree hemorrhoids: Secondary | ICD-10-CM | POA: Diagnosis not present

## 2016-08-17 DIAGNOSIS — Z800 Family history of malignant neoplasm of digestive organs: Secondary | ICD-10-CM | POA: Diagnosis not present

## 2016-08-17 HISTORY — PX: COLONOSCOPY: SHX174

## 2016-08-21 DIAGNOSIS — N912 Amenorrhea, unspecified: Secondary | ICD-10-CM | POA: Diagnosis not present

## 2016-08-21 DIAGNOSIS — R634 Abnormal weight loss: Secondary | ICD-10-CM | POA: Diagnosis not present

## 2016-08-21 DIAGNOSIS — R109 Unspecified abdominal pain: Secondary | ICD-10-CM | POA: Diagnosis not present

## 2016-08-21 DIAGNOSIS — Z6821 Body mass index (BMI) 21.0-21.9, adult: Secondary | ICD-10-CM | POA: Diagnosis not present

## 2016-08-21 DIAGNOSIS — Z7689 Persons encountering health services in other specified circumstances: Secondary | ICD-10-CM | POA: Diagnosis not present

## 2016-09-11 DIAGNOSIS — M533 Sacrococcygeal disorders, not elsewhere classified: Secondary | ICD-10-CM | POA: Diagnosis not present

## 2016-09-11 DIAGNOSIS — G43719 Chronic migraine without aura, intractable, without status migrainosus: Secondary | ICD-10-CM | POA: Diagnosis not present

## 2016-09-11 DIAGNOSIS — M5136 Other intervertebral disc degeneration, lumbar region: Secondary | ICD-10-CM | POA: Diagnosis not present

## 2016-09-11 DIAGNOSIS — M961 Postlaminectomy syndrome, not elsewhere classified: Secondary | ICD-10-CM | POA: Diagnosis not present

## 2016-09-11 DIAGNOSIS — M5416 Radiculopathy, lumbar region: Secondary | ICD-10-CM | POA: Diagnosis not present

## 2016-09-11 DIAGNOSIS — M791 Myalgia: Secondary | ICD-10-CM | POA: Diagnosis not present

## 2016-09-11 DIAGNOSIS — R51 Headache: Secondary | ICD-10-CM | POA: Diagnosis not present

## 2016-09-11 DIAGNOSIS — G518 Other disorders of facial nerve: Secondary | ICD-10-CM | POA: Diagnosis not present

## 2016-09-11 DIAGNOSIS — G43839 Menstrual migraine, intractable, without status migrainosus: Secondary | ICD-10-CM | POA: Diagnosis not present

## 2016-09-11 DIAGNOSIS — G43019 Migraine without aura, intractable, without status migrainosus: Secondary | ICD-10-CM | POA: Diagnosis not present

## 2016-09-11 DIAGNOSIS — M542 Cervicalgia: Secondary | ICD-10-CM | POA: Diagnosis not present

## 2016-10-11 DIAGNOSIS — L821 Other seborrheic keratosis: Secondary | ICD-10-CM | POA: Diagnosis not present

## 2016-10-11 DIAGNOSIS — D485 Neoplasm of uncertain behavior of skin: Secondary | ICD-10-CM | POA: Diagnosis not present

## 2016-10-30 DIAGNOSIS — A932 Colorado tick fever: Secondary | ICD-10-CM | POA: Diagnosis not present

## 2016-11-12 DIAGNOSIS — N3010 Interstitial cystitis (chronic) without hematuria: Secondary | ICD-10-CM

## 2016-11-12 DIAGNOSIS — Z01419 Encounter for gynecological examination (general) (routine) without abnormal findings: Secondary | ICD-10-CM | POA: Diagnosis not present

## 2016-11-12 DIAGNOSIS — Z6821 Body mass index (BMI) 21.0-21.9, adult: Secondary | ICD-10-CM | POA: Diagnosis not present

## 2016-11-12 HISTORY — DX: Interstitial cystitis (chronic) without hematuria: N30.10

## 2016-11-13 DIAGNOSIS — M5416 Radiculopathy, lumbar region: Secondary | ICD-10-CM | POA: Diagnosis not present

## 2016-11-13 DIAGNOSIS — M533 Sacrococcygeal disorders, not elsewhere classified: Secondary | ICD-10-CM | POA: Diagnosis not present

## 2016-11-13 DIAGNOSIS — M961 Postlaminectomy syndrome, not elsewhere classified: Secondary | ICD-10-CM | POA: Diagnosis not present

## 2016-11-13 DIAGNOSIS — M5136 Other intervertebral disc degeneration, lumbar region: Secondary | ICD-10-CM | POA: Diagnosis not present

## 2017-01-08 DIAGNOSIS — A09 Infectious gastroenteritis and colitis, unspecified: Secondary | ICD-10-CM | POA: Diagnosis not present

## 2017-01-11 ENCOUNTER — Other Ambulatory Visit: Payer: Self-pay | Admitting: Specialist

## 2017-01-11 DIAGNOSIS — M542 Cervicalgia: Principal | ICD-10-CM

## 2017-01-11 DIAGNOSIS — M961 Postlaminectomy syndrome, not elsewhere classified: Secondary | ICD-10-CM | POA: Diagnosis not present

## 2017-01-11 DIAGNOSIS — M533 Sacrococcygeal disorders, not elsewhere classified: Secondary | ICD-10-CM | POA: Diagnosis not present

## 2017-01-11 DIAGNOSIS — G8929 Other chronic pain: Secondary | ICD-10-CM

## 2017-01-11 DIAGNOSIS — M5136 Other intervertebral disc degeneration, lumbar region: Secondary | ICD-10-CM | POA: Diagnosis not present

## 2017-01-11 DIAGNOSIS — M5416 Radiculopathy, lumbar region: Secondary | ICD-10-CM | POA: Diagnosis not present

## 2017-01-20 ENCOUNTER — Other Ambulatory Visit: Payer: PPO

## 2017-01-24 DIAGNOSIS — J209 Acute bronchitis, unspecified: Secondary | ICD-10-CM | POA: Diagnosis not present

## 2017-01-24 DIAGNOSIS — J029 Acute pharyngitis, unspecified: Secondary | ICD-10-CM | POA: Diagnosis not present

## 2017-02-01 ENCOUNTER — Ambulatory Visit
Admission: RE | Admit: 2017-02-01 | Discharge: 2017-02-01 | Disposition: A | Discharge status: Home / Self Care | Payer: PPO | Source: Ambulatory Visit | Attending: Specialist | Admitting: Specialist

## 2017-02-01 DIAGNOSIS — M542 Cervicalgia: Principal | ICD-10-CM

## 2017-02-01 DIAGNOSIS — M50223 Other cervical disc displacement at C6-C7 level: Secondary | ICD-10-CM | POA: Diagnosis not present

## 2017-02-01 DIAGNOSIS — G8929 Other chronic pain: Secondary | ICD-10-CM

## 2017-02-12 DIAGNOSIS — K219 Gastro-esophageal reflux disease without esophagitis: Secondary | ICD-10-CM | POA: Diagnosis not present

## 2017-02-12 DIAGNOSIS — R1013 Epigastric pain: Secondary | ICD-10-CM | POA: Diagnosis not present

## 2017-02-14 DIAGNOSIS — Z1339 Encounter for screening examination for other mental health and behavioral disorders: Secondary | ICD-10-CM | POA: Diagnosis not present

## 2017-02-14 DIAGNOSIS — K219 Gastro-esophageal reflux disease without esophagitis: Secondary | ICD-10-CM | POA: Diagnosis not present

## 2017-02-14 DIAGNOSIS — N912 Amenorrhea, unspecified: Secondary | ICD-10-CM | POA: Diagnosis not present

## 2017-02-14 DIAGNOSIS — R0789 Other chest pain: Secondary | ICD-10-CM | POA: Diagnosis not present

## 2017-02-14 DIAGNOSIS — Z1331 Encounter for screening for depression: Secondary | ICD-10-CM | POA: Diagnosis not present

## 2017-02-14 DIAGNOSIS — Z6821 Body mass index (BMI) 21.0-21.9, adult: Secondary | ICD-10-CM | POA: Diagnosis not present

## 2017-03-01 DIAGNOSIS — K2900 Acute gastritis without bleeding: Secondary | ICD-10-CM | POA: Diagnosis not present

## 2017-03-01 DIAGNOSIS — K228 Other specified diseases of esophagus: Secondary | ICD-10-CM | POA: Diagnosis not present

## 2017-03-01 DIAGNOSIS — K200 Eosinophilic esophagitis: Secondary | ICD-10-CM | POA: Diagnosis not present

## 2017-03-01 DIAGNOSIS — K219 Gastro-esophageal reflux disease without esophagitis: Secondary | ICD-10-CM | POA: Diagnosis not present

## 2017-03-01 DIAGNOSIS — R1310 Dysphagia, unspecified: Secondary | ICD-10-CM | POA: Diagnosis not present

## 2017-03-01 HISTORY — PX: ESOPHAGOGASTRODUODENOSCOPY: SHX1529

## 2017-03-04 DIAGNOSIS — R9431 Abnormal electrocardiogram [ECG] [EKG]: Secondary | ICD-10-CM | POA: Diagnosis not present

## 2017-03-04 DIAGNOSIS — Z87891 Personal history of nicotine dependence: Secondary | ICD-10-CM

## 2017-03-04 DIAGNOSIS — R079 Chest pain, unspecified: Secondary | ICD-10-CM | POA: Diagnosis not present

## 2017-03-04 DIAGNOSIS — K219 Gastro-esophageal reflux disease without esophagitis: Secondary | ICD-10-CM

## 2017-03-04 DIAGNOSIS — R0789 Other chest pain: Secondary | ICD-10-CM

## 2017-03-04 HISTORY — DX: Personal history of nicotine dependence: Z87.891

## 2017-03-04 HISTORY — DX: Other chest pain: R07.89

## 2017-03-11 DIAGNOSIS — D225 Melanocytic nevi of trunk: Secondary | ICD-10-CM | POA: Diagnosis not present

## 2017-03-11 DIAGNOSIS — L578 Other skin changes due to chronic exposure to nonionizing radiation: Secondary | ICD-10-CM | POA: Diagnosis not present

## 2017-03-11 DIAGNOSIS — L814 Other melanin hyperpigmentation: Secondary | ICD-10-CM | POA: Diagnosis not present

## 2017-03-11 DIAGNOSIS — L570 Actinic keratosis: Secondary | ICD-10-CM | POA: Diagnosis not present

## 2017-03-11 DIAGNOSIS — D1801 Hemangioma of skin and subcutaneous tissue: Secondary | ICD-10-CM | POA: Diagnosis not present

## 2017-03-14 DIAGNOSIS — R079 Chest pain, unspecified: Secondary | ICD-10-CM | POA: Diagnosis not present

## 2017-03-20 ENCOUNTER — Ambulatory Visit: Payer: PPO | Admitting: Allergy and Immunology

## 2017-03-20 ENCOUNTER — Encounter: Payer: Self-pay | Admitting: Allergy and Immunology

## 2017-03-20 VITALS — BP 114/80 | HR 76 | Temp 99.1°F | Resp 16 | Ht 67.0 in | Wt 129.0 lb

## 2017-03-20 DIAGNOSIS — J3089 Other allergic rhinitis: Secondary | ICD-10-CM | POA: Diagnosis not present

## 2017-03-20 DIAGNOSIS — T7800XA Anaphylactic reaction due to unspecified food, initial encounter: Secondary | ICD-10-CM

## 2017-03-20 DIAGNOSIS — K200 Eosinophilic esophagitis: Secondary | ICD-10-CM

## 2017-03-20 DIAGNOSIS — J4520 Mild intermittent asthma, uncomplicated: Secondary | ICD-10-CM | POA: Diagnosis not present

## 2017-03-20 MED ORDER — METHYLPREDNISOLONE ACETATE 80 MG/ML IJ SUSP
80.0000 mg | Freq: Once | INTRAMUSCULAR | Status: AC
  Administered 2017-03-20: 80 mg via INTRAMUSCULAR

## 2017-03-20 NOTE — Patient Instructions (Addendum)
  1.  Allergen avoidance measures  2.  Consider a dairy free diet  3.  Continue Flovent 110 2 puffs and swallow twice a day  4.  Continue Dexilant and ranitidine and consider discontinuing Carafate  5.  Depo-Medrol 80 IM delivered in clinic today  6.  Blood -alpha gal panel, shellfish panel, CBC with differential  7.  Return to clinic in 4 weeks or earlier if problem  8.  Obtain fall flu vaccine every year

## 2017-03-20 NOTE — Progress Notes (Signed)
Dear Dr. Lyndel Safe,  Thank you for referring Rod Can to the [facility] on 03/20/2017.   Below is a summation of this patient's evaluation and recommendations.  Thank you for your referral. I will keep you informed about this patient's response to treatment.   If you have any questions please do not hesitate to contact me.   Sincerely,  Jiles Prows, MD Allergy / Immunology [place]   ______________________________________________________________________    NEW PATIENT NOTE  Referring Provider: [name], MD Primary Provider: Mateo Flow, MD Date of office visit: 03/20/2017    Subjective:   Chief Complaint:  Lisa [name] (DOB: 05-15-1970) is a 46 y.o. female who presents to the clinic on 03/20/2017 with a chief complaint of Other (Eosinophilic Esophagitis ) and Food Intolerance (Alpha Gal ) .     HPI: Aishani presents to this clinic in evaluation of eosinophilic esophagitis.  At least one year ago Junko started to develop problems with repetitive vomiting and chest pain and regurgitation and heartburn that has been a progressive issue.  Her weight in January 2016 was approximately 165 and she now weighs approximately 125 pounds.  This weight loss has occurred secondary to this GI issue of vomiting and also abdominal pain.  She visited with Dr. Lyndel Safe about 3 weeks ago with documented eosinophilic esophagitis and started her on swallowed Flovent, Dexilant, Zantac, and Carafate.  She is better this week and has not vomited this week.  However, she has developed very significant constipation and she thinks that this is secondary to the use of Carafate.  She does have a history of oscillating constipation and diarrhea complicated by the fact that she does use narcotics daily for chronic pain.  She has tried a multitude of different medications for this issue including the use of  narcotic specific promotility agents but unfortunately developed significant side effects with GI cramping while utilizing this medication.  In addition, she has been told that she has alpha gal syndrome.  Apparently she had an embedded tick that was removed by [facility] dermatology in May 2018 and a blood test was performed which identified alpha gal IgE antibodies apparently and she was counseled not to eat any mammal.  She has been mammal free since that point in time.  She did eat marinara sauce that had beef contained with it in September and she thought that she developed some throat clearing and chest tightness and may be wheezing and developed a red itchy rash on her trunk within 30 minutes of eating this food.  As well, she does have a history of developing problems with shellfish consumption.  Many years ago she ate shrimp and developed chest tightness and itchy throat and rash on her chest and she has been shellfish free for many years.  She has a history of asthma that has improved tremendously since since she discontinued smoking about 10 years ago.  She still has issues with cold air exposure or if she develops a head cold and exercise in regard to precipitating some coughing and wheezing but she does not have a rescue inhaler and has not used a rescue inhaler in years. She also has some issues with intermittent nasal congestion and sneezing which has been a long-standing issue.  Past Medical History:  Diagnosis Date  . Chronic back pain   . Eosinophilic esophagitis     Past Surgical History:  Procedure Laterality Date  . BACK SURGERY    . CHOLECYSTECTOMY    . PLACEMENT OF BREAST IMPLANTS      Allergies as of 03/20/2017      Reactions   Azithromycin Other (See Comments)   Unknown   Latex Other (See Comments)   Unknown   Metaxalone Itching      Medication List      BIEST/PROGESTERONE TD APPLY ONE CLICK(0.25ML) TOPICALLY ONCE DAILY ROTATING SITES-INNER WRIST, INNER/OUTER  THIGH, AND BACK OF KNEE   DEXILANT 60 MG capsule Generic drug:  dexlansoprazole Take 60 mg daily by mouth.   ELMIRON 100 MG capsule Generic drug:  pentosan polysulfate Elmiron 100 mg capsule  TAKE 1 CAPSULE BY MOUTH THREE TIMES DAILY   FLOVENT HFA 110 MCG/ACT inhaler Generic drug:  fluticasone U 1 PUFF PO BID FOR 1 MONTH THEN 1 PUFF D X 2 MONTHS   HYDROcodone-acetaminophen 10-325 MG tablet Commonly known as:  NORCO hydrocodone 10 mg-acetaminophen 325 mg tablet   LINZESS 72 MCG capsule Generic drug:  linaclotide Linzess 72 mcg capsule   Melatonin 10 MG Caps Take by mouth.   methocarbamol 750 MG tablet Commonly known as:  ROBAXIN methocarbamol 750 mg tablet   morphine 30 MG 12 hr tablet Commonly known as:  MS CONTIN morphine ER 30 mg tablet,extended release   PROGESTERONE PO TAKE ONE CAPSULE BY MOUTH 30-45 MINUTES BEFORE BEDTIME   ranitidine 150 MG tablet Commonly known as:  ZANTAC TK 1 T PO QHS   VESICARE 10 MG tablet Generic drug:  solifenacin TK 1 T PO D   zonisamide 25 MG capsule Commonly known as:  ZONEGRAN TK 4 CS PO QHS       Review of systems negative except as noted in HPI / PMHx or noted below:  Review of Systems  Constitutional: Negative.   HENT: Negative.   Eyes: Negative.   Respiratory: Negative.   Cardiovascular: Negative.   Gastrointestinal: Negative.   Genitourinary: Negative.   Musculoskeletal: Negative.   Skin: Negative.   Neurological: Negative.   Endo/Heme/Allergies: Negative.   Psychiatric/Behavioral: Negative.     Family History  Problem Relation Age of Onset  . Cancer Mother   . Cancer Maternal Aunt   . Heart attack Maternal Aunt   . Cancer Maternal Uncle   . Heart attack Maternal Uncle     Social History   Socioeconomic History  . Marital status: Married    Spouse name: Not on file  . Number of children: Not on file  . Years of education: Not on file  . Highest education level: Not on file  Social Needs  .  Financial resource strain: Not on file  . Food insecurity - worry: Not on file  . Food insecurity - inability: Not on file  . Transportation needs - medical: Not on file  . Transportation needs - non-medical: Not on file  Occupational History  . Not on file  Tobacco Use  . Smoking status: Former Research scientist (life sciences)  . Smokeless tobacco: Never Used  Substance and Sexual Activity  . Alcohol use: Not on file  . Drug use: Not on file  . Sexual activity: Not on file  Other Topics Concern  . Not on file  Social History Narrative  . Not on file    Environmental and Social history  Lives in a house with a dry environment, dogs located inside the household, carpet in the bedroom, plastic on the bed, no plastic on the pillow, and no smokers  located inside the household.  She is disabled.  Objective:   Vitals:   03/20/17 1343  BP: 114/80  Pulse: 76  Resp: 16  Temp: 99.1 F (37.3 C)   Height: 5\' 7"  (170.2 cm) Weight: 129 lb (58.5 kg)  Physical Exam  Constitutional: She is well-developed, well-nourished, and in no distress.  HENT:  Head: Normocephalic. Head is without right periorbital erythema and without left periorbital erythema.  Right Ear: Tympanic membrane, external ear and ear canal normal.  Left Ear: Tympanic membrane, external ear and ear canal normal.  Nose: Nose normal. No mucosal edema or rhinorrhea.  Mouth/Throat: Oropharynx is clear and moist and mucous membranes are normal. No oropharyngeal exudate.  Eyes: Conjunctivae and lids are normal. Pupils are equal, round, and reactive to light.  Neck: Trachea normal. No tracheal deviation present. No thyromegaly present.  Cardiovascular: Normal rate, regular rhythm, S1 normal, S2 normal and normal heart sounds.  No murmur heard. Pulmonary/Chest: Effort normal. No stridor. No tachypnea. No respiratory distress. She has no wheezes. She has no rales. She exhibits no tenderness.  Abdominal: Soft. She exhibits no distension and no mass.  There is no hepatosplenomegaly. There is no tenderness. There is no rebound and no guarding.  Musculoskeletal: She exhibits no edema or tenderness.  Lymphadenopathy:       Head (right side): No tonsillar adenopathy present.       Head (left side): No tonsillar adenopathy present.    She has no cervical adenopathy.    She has no axillary adenopathy.  Neurological: She is alert. Gait normal.  Skin: No rash noted. She is not diaphoretic. No erythema. No pallor. Nails show no clubbing.  Psychiatric: Mood and affect normal.    Diagnostics: Allergy skin tests were performed.  She did not demonstrate any hypersensitivity against a screening panel of foods including shellfish and mammal.  Spirometry was performed and demonstrated an FEV1 of 1.93 @ 85 % of predicted. FEV1/FVC = 0.9  Review of a esophageal biopsy obtained 01 March 2017 identified greater than 30 eosinophils per high-powered field.  Assessment and Plan:    1. Eosinophilic esophagitis   2. Anaphylactic shock due to food, initial encounter   3. Asthma, mild intermittent, well-controlled   4. Other allergic rhinitis     1.  Allergen avoidance measures  2.  Consider a dairy free diet  3.  Continue Flovent 110 2 puffs and swallow twice a day  4.  Continue Dexilant and ranitidine and consider discontinuing Carafate  5.  Depo-Medrol 80 IM delivered in clinic today  6.  Blood -alpha gal panel, shellfish panel, CBC with differential  7.  Return to clinic in 4 weeks or earlier if problem  8.  Obtain fall flu vaccine every year  Gissell will utilize a plan of avoidance measures directed against dairy and swallowed steroids and therapy directed against acid exposure in her esophagus and we will see what happens over the course of the next 4 weeks or so.  I did give her a systemic steroid today to help push this process along a little faster than it has been moving at this point.  Apparently she has been developing significant  constipation since using Carafate and I had a talk with her today about possibly stopping this agent.  Whether or not she has a true food allergy directed against shellfish and mammal is still an open question and we will have her obtain the blood tests noted above to address this issue.  I will see her back in his clinic in 4 weeks or earlier if there is a problem.  Jiles Prows, MD Allergy / Immunology [facility]

## 2017-03-21 ENCOUNTER — Encounter: Payer: Self-pay | Admitting: Allergy and Immunology

## 2017-03-26 LAB — ALPHA-GAL PANEL
Beef (Bos spp) IgE: <0.1 kU/L (ref <0.35)
Class Interpretation: 0
Class Interpretation: 0
LAMB CLASS INTERPRETATION: 0
Lamb/Mutton (Ovis spp) IgE: <0.1 kU/L (ref <0.35)

## 2017-03-26 LAB — CBC WITH DIFFERENTIAL/PLATELET
BASOS ABS: 0 10*3/uL (ref 0.0–0.2)
BASOS: 1 %
EOS (ABSOLUTE): 0.1 10*3/uL (ref 0.0–0.4)
Eos: 4 %
HEMOGLOBIN: 13.3 g/dL (ref 11.1–15.9)
Hematocrit: 40 % (ref 34.0–46.6)
Immature Grans (Abs): 0 10*3/uL (ref 0.0–0.1)
Immature Granulocytes: 0 %
LYMPHS ABS: 1.7 10*3/uL (ref 0.7–3.1)
Lymphs: 43 %
MCH: 31.1 pg (ref 26.6–33.0)
MCHC: 33.3 g/dL (ref 31.5–35.7)
MCV: 94 fL (ref 79–97)
Monocytes Absolute: 0.2 10*3/uL (ref 0.1–0.9)
Monocytes: 6 %
NEUTROS ABS: 1.8 10*3/uL (ref 1.4–7.0)
Neutrophils: 46 %
PLATELETS: 284 10*3/uL (ref 150–379)
RBC: 4.27 x10E6/uL (ref 3.77–5.28)
RDW: 13.8 % (ref 12.3–15.4)
WBC: 3.9 10*3/uL (ref 3.4–10.8)

## 2017-03-26 LAB — ALLERGEN PROFILE, SHELLFISH
Clam IgE: <0.1 kU/L
F023-IgE Crab: <0.1 kU/L
F290-IgE Oyster: <0.1 kU/L

## 2017-04-12 DIAGNOSIS — M961 Postlaminectomy syndrome, not elsewhere classified: Secondary | ICD-10-CM | POA: Diagnosis not present

## 2017-04-12 DIAGNOSIS — M5416 Radiculopathy, lumbar region: Secondary | ICD-10-CM | POA: Diagnosis not present

## 2017-04-12 DIAGNOSIS — G894 Chronic pain syndrome: Secondary | ICD-10-CM | POA: Diagnosis not present

## 2017-04-12 DIAGNOSIS — M5136 Other intervertebral disc degeneration, lumbar region: Secondary | ICD-10-CM | POA: Diagnosis not present

## 2017-04-12 DIAGNOSIS — M533 Sacrococcygeal disorders, not elsewhere classified: Secondary | ICD-10-CM | POA: Diagnosis not present

## 2017-04-18 ENCOUNTER — Ambulatory Visit: Payer: PPO | Admitting: Allergy and Immunology

## 2017-05-03 DIAGNOSIS — S161XXS Strain of muscle, fascia and tendon at neck level, sequela: Secondary | ICD-10-CM | POA: Diagnosis not present

## 2017-05-03 DIAGNOSIS — M533 Sacrococcygeal disorders, not elsewhere classified: Secondary | ICD-10-CM | POA: Diagnosis not present

## 2017-05-03 DIAGNOSIS — Z6821 Body mass index (BMI) 21.0-21.9, adult: Secondary | ICD-10-CM | POA: Diagnosis not present

## 2017-05-03 DIAGNOSIS — G8929 Other chronic pain: Secondary | ICD-10-CM | POA: Diagnosis not present

## 2017-05-06 ENCOUNTER — Ambulatory Visit: Payer: PPO | Admitting: Allergy and Immunology

## 2017-05-13 DIAGNOSIS — M542 Cervicalgia: Secondary | ICD-10-CM | POA: Diagnosis not present

## 2017-05-13 DIAGNOSIS — M6281 Muscle weakness (generalized): Secondary | ICD-10-CM | POA: Diagnosis not present

## 2017-05-20 DIAGNOSIS — M542 Cervicalgia: Secondary | ICD-10-CM | POA: Diagnosis not present

## 2017-05-20 DIAGNOSIS — M6281 Muscle weakness (generalized): Secondary | ICD-10-CM | POA: Diagnosis not present

## 2017-05-21 DIAGNOSIS — J0100 Acute maxillary sinusitis, unspecified: Secondary | ICD-10-CM | POA: Diagnosis not present

## 2017-05-23 DIAGNOSIS — M6281 Muscle weakness (generalized): Secondary | ICD-10-CM | POA: Diagnosis not present

## 2017-05-23 DIAGNOSIS — M542 Cervicalgia: Secondary | ICD-10-CM | POA: Diagnosis not present

## 2017-05-27 DIAGNOSIS — M542 Cervicalgia: Secondary | ICD-10-CM | POA: Diagnosis not present

## 2017-05-27 DIAGNOSIS — M6281 Muscle weakness (generalized): Secondary | ICD-10-CM | POA: Diagnosis not present

## 2017-06-03 DIAGNOSIS — M6281 Muscle weakness (generalized): Secondary | ICD-10-CM | POA: Diagnosis not present

## 2017-06-03 DIAGNOSIS — M542 Cervicalgia: Secondary | ICD-10-CM | POA: Diagnosis not present

## 2017-06-06 DIAGNOSIS — M6281 Muscle weakness (generalized): Secondary | ICD-10-CM | POA: Diagnosis not present

## 2017-06-06 DIAGNOSIS — M542 Cervicalgia: Secondary | ICD-10-CM | POA: Diagnosis not present

## 2017-06-13 DIAGNOSIS — M542 Cervicalgia: Secondary | ICD-10-CM | POA: Diagnosis not present

## 2017-06-13 DIAGNOSIS — M6281 Muscle weakness (generalized): Secondary | ICD-10-CM | POA: Diagnosis not present

## 2017-06-15 DIAGNOSIS — J069 Acute upper respiratory infection, unspecified: Secondary | ICD-10-CM | POA: Diagnosis not present

## 2017-06-15 DIAGNOSIS — R21 Rash and other nonspecific skin eruption: Secondary | ICD-10-CM | POA: Diagnosis not present

## 2017-06-17 ENCOUNTER — Encounter: Payer: Self-pay | Admitting: Allergy and Immunology

## 2017-06-17 ENCOUNTER — Ambulatory Visit: Payer: PPO | Admitting: Allergy and Immunology

## 2017-06-17 VITALS — BP 102/78 | HR 64 | Resp 16

## 2017-06-17 DIAGNOSIS — K200 Eosinophilic esophagitis: Secondary | ICD-10-CM

## 2017-06-17 DIAGNOSIS — J324 Chronic pansinusitis: Secondary | ICD-10-CM | POA: Diagnosis not present

## 2017-06-17 NOTE — Progress Notes (Signed)
Follow-up Note  Referring Provider: Mateo Flow, MD Primary Provider: Mateo Flow, MD Date of Office Visit: 06/17/2017  Subjective:   Lisa [name] (DOB: Jan 23, 1971) is a 47 y.o. female who returns to the [facility] on 06/17/2017 in re-evaluation of the following:  HPI: Woodrow presents to this clinic in evaluation of eosinophilic esophagitis.  Her last visit to this clinic was her initial evaluation of 20 March 2017.  Overall she thinks that she is much better regarding her swallowing and her nausea.  She did discontinue the Carafate and indeed this was giving rise to very significant constipation.  She has attempted dairy free diet but she is only about 80% successful in doing so.  She still remains away from mammal.  She thinks that she gets a rash if she eats mammal.  As well, she stays away from shellfish consumption.  She states that 5 weeks ago she developed "sinusitis".  3 weeks ago she was treated with prednisone and antibiotics and given Xyzal and Sudafed and Robitussin.  It is difficult to nail down exactly what symptoms she had at the beginning of her sinusitis but she was left with a headache and some right earache.  Unfortunately, this past Friday she developed sore throat and laryngitis and ear aches without any fever or ugly nasal discharge or other symptoms.  Allergies as of 06/17/2017      Reactions   Azithromycin Other (See Comments)   Unknown   Latex Other (See Comments)   Unknown   Metaxalone Itching      Medication List      BIEST/PROGESTERONE TD APPLY ONE CLICK(0.25ML) TOPICALLY ONCE DAILY ROTATING SITES-INNER WRIST, INNER/OUTER THIGH, AND BACK OF KNEE   DEXILANT 60 MG capsule Generic drug:  dexlansoprazole Take 60 mg daily by mouth.   ELMIRON 100 MG capsule Generic drug:  pentosan polysulfate Elmiron 100 mg capsule  TAKE 1 CAPSULE BY MOUTH THREE TIMES DAILY   FLOVENT HFA 110 MCG/ACT inhaler Generic drug:  fluticasone U 1  PUFF PO BID FOR 1 MONTH THEN 1 PUFF D X 2 MONTHS   HYDROcodone-acetaminophen 10-325 MG tablet Commonly known as:  NORCO hydrocodone 10 mg-acetaminophen 325 mg tablet   Melatonin 10 MG Caps Take by mouth.   methocarbamol 750 MG tablet Commonly known as:  ROBAXIN methocarbamol 750 mg tablet   morphine 30 MG 12 hr tablet Commonly known as:  MS CONTIN morphine ER 30 mg tablet,extended release   polyethylene glycol packet Commonly known as:  MIRALAX / GLYCOLAX Take 17 g by mouth daily.   PROGESTERONE PO TAKE ONE CAPSULE BY MOUTH 30-45 MINUTES BEFORE BEDTIME   ranitidine 150 MG tablet Commonly known as:  ZANTAC TK 1 T PO QHS   VESICARE 10 MG tablet Generic drug:  solifenacin TK 1 T PO D   zonisamide 25 MG capsule Commonly known as:  ZONEGRAN TK 4 CS PO QHS       Past Medical History:  Diagnosis Date  . Chronic back pain   . Eosinophilic esophagitis     Past Surgical History:  Procedure Laterality Date  . BACK SURGERY    . CHOLECYSTECTOMY    . PLACEMENT OF BREAST IMPLANTS      Review of systems negative except as noted in HPI / PMHx or noted below:  Review of Systems  Constitutional: Negative.   HENT: Negative.   Eyes: Negative.   Respiratory: Negative.   Cardiovascular: Negative.   Gastrointestinal: Negative.  Genitourinary: Negative.   Musculoskeletal: Negative.   Skin: Negative.   Neurological: Negative.   Endo/Heme/Allergies: Negative.   Psychiatric/Behavioral: Negative.      Objective:   Vitals:   06/17/17 1801  BP: 102/78  Pulse: 64  Resp: 16          Physical Exam  Constitutional: She is well-developed, well-nourished, and in no distress.  Squeaky raspy voice  HENT:  Head: Normocephalic.  Right Ear: External ear and ear canal normal. Tympanic membrane is scarred.  Left Ear: Tympanic membrane, external ear and ear canal normal.  Nose: Mucosal edema (Erythematous) present. No rhinorrhea.  Mouth/Throat: Uvula is midline,  oropharynx is clear and moist and mucous membranes are normal. No oropharyngeal exudate.  Eyes: Conjunctivae are normal.  Neck: Trachea normal. No tracheal tenderness present. No tracheal deviation present. No thyromegaly present.  Cardiovascular: Normal rate, regular rhythm, S1 normal, S2 normal and normal heart sounds.  No murmur heard. Pulmonary/Chest: Breath sounds normal. No stridor. No respiratory distress. She has no wheezes. She has no rales.  Musculoskeletal: She exhibits no edema.  Lymphadenopathy:       Head (right side): No tonsillar adenopathy present.       Head (left side): No tonsillar adenopathy present.    She has no cervical adenopathy.  Neurological: She is alert. Gait normal.  Skin: No rash noted. She is not diaphoretic. No erythema. Nails show no clubbing.  Psychiatric: Mood and affect normal.    Diagnostics:   Results of blood tests obtained 20 March 2017 identified WBC 3.9, eosinophil 100, lymphocyte 1700, hemoglobin 13.3, platelet 284, no IgE antibodies directed against alpha gal or mammals or shellfish.  Assessment and Plan:   1. Eosinophilic esophagitis   2. Chronic pansinusitis     1.  Consider a dairy free diet  2.  Decrease Flovent 110 ONE puffs and swallow twice a day  3.  Continue Dexilant 60mg  in AM + ranitidine 150-300 mg in PM  4. For this recent infection use the following:   A. Nasal saline multiple times per day  B. OTC acetaminophen  C. Xyzal 5mg  one time per day  D. OTC DM 1-2 tablet 2 times per day    6. 2 weeks from today obtain sinus CT scan for chronic sinusitis    7.  Return to clinic in 12 weeks of earlier if problem  I think Yenesis is doing better regarding her eosinophilic esophagitis.  We will see if we can decrease her Flovent dose.  I did encourage her again to consider a 100% dairy free diet.  She apparently has "sinusitis" for 5 weeks and it sounds as though just this past weekend she developed a acute viral  respiratory tract infection.  We will treat her conservatively as mentioned above for this viral infection assuming this will resolve within the next 10 days.  We will obtain a CT scan to rule out chronic sinusitis contributing to her lingering symptoms that did not respond to an antibiotic and steroid.  I will contact her with the results of her CT scan once it is available for review. I will see her back in this clinic in 12 weeks or earlier if there is a problem.   Allena Katz, MD Allergy / Immunology [place]

## 2017-06-17 NOTE — Patient Instructions (Addendum)
  1.  Consider a dairy free diet  2.  Decrease Flovent 110 ONE puffs and swallow twice a day  3.  Continue Dexilant 60mg  in AM + ranitidine 150-300 mg in PM  4. For this recent infection use the following:   A. Nasal saline multiple times per day  B. OTC acetaminophen  C. Xyzal 5mg  one time per day  D. OTC DM 1-2 tablet 2 times per day    6. 2 weeks from today obtain sinus CT scan for chronic sinusitis    7.  Return to clinic in 12 weeks of earlier if problem

## 2017-06-18 ENCOUNTER — Encounter: Payer: Self-pay | Admitting: Allergy and Immunology

## 2017-06-18 ENCOUNTER — Encounter: Payer: Self-pay | Admitting: *Deleted

## 2017-06-18 ENCOUNTER — Telehealth: Payer: Self-pay | Admitting: *Deleted

## 2017-06-18 NOTE — Telephone Encounter (Signed)
Scheduled Sinus CT at [facility] for 07/01/2017 at 10:00 arriving at 9:30 am. Patient has been informed.

## 2017-07-08 DIAGNOSIS — M533 Sacrococcygeal disorders, not elsewhere classified: Secondary | ICD-10-CM | POA: Diagnosis not present

## 2017-07-08 DIAGNOSIS — M5416 Radiculopathy, lumbar region: Secondary | ICD-10-CM | POA: Diagnosis not present

## 2017-07-08 DIAGNOSIS — M5136 Other intervertebral disc degeneration, lumbar region: Secondary | ICD-10-CM | POA: Diagnosis not present

## 2017-08-20 DIAGNOSIS — M542 Cervicalgia: Secondary | ICD-10-CM | POA: Diagnosis not present

## 2017-08-20 DIAGNOSIS — M6281 Muscle weakness (generalized): Secondary | ICD-10-CM | POA: Diagnosis not present

## 2017-08-29 DIAGNOSIS — M542 Cervicalgia: Secondary | ICD-10-CM | POA: Diagnosis not present

## 2017-08-29 DIAGNOSIS — M6281 Muscle weakness (generalized): Secondary | ICD-10-CM | POA: Diagnosis not present

## 2017-09-02 DIAGNOSIS — M542 Cervicalgia: Secondary | ICD-10-CM | POA: Diagnosis not present

## 2017-09-02 DIAGNOSIS — M6281 Muscle weakness (generalized): Secondary | ICD-10-CM | POA: Diagnosis not present

## 2017-09-04 DIAGNOSIS — M542 Cervicalgia: Secondary | ICD-10-CM | POA: Diagnosis not present

## 2017-09-04 DIAGNOSIS — M6281 Muscle weakness (generalized): Secondary | ICD-10-CM | POA: Diagnosis not present

## 2017-09-05 ENCOUNTER — Telehealth: Payer: Self-pay

## 2017-09-05 NOTE — Telephone Encounter (Signed)
Would you like to give any refills on the ranitidine? [facility].

## 2017-09-06 NOTE — Telephone Encounter (Signed)
Please do the same dose, 6 refills

## 2017-09-09 MED ORDER — RANITIDINE HCL 150 MG PO TABS: ORAL_TABLET | ORAL | 6 refills | Status: DC

## 2017-09-09 NOTE — Telephone Encounter (Signed)
Sent refills to patients pharmacy.  

## 2017-09-11 DIAGNOSIS — M542 Cervicalgia: Secondary | ICD-10-CM | POA: Diagnosis not present

## 2017-09-11 DIAGNOSIS — M6281 Muscle weakness (generalized): Secondary | ICD-10-CM | POA: Diagnosis not present

## 2017-10-08 DIAGNOSIS — M961 Postlaminectomy syndrome, not elsewhere classified: Secondary | ICD-10-CM | POA: Diagnosis not present

## 2017-10-08 DIAGNOSIS — Z79891 Long term (current) use of opiate analgesic: Secondary | ICD-10-CM | POA: Diagnosis not present

## 2017-10-08 DIAGNOSIS — M5136 Other intervertebral disc degeneration, lumbar region: Secondary | ICD-10-CM | POA: Diagnosis not present

## 2017-10-08 DIAGNOSIS — G894 Chronic pain syndrome: Secondary | ICD-10-CM | POA: Diagnosis not present

## 2017-10-08 DIAGNOSIS — M533 Sacrococcygeal disorders, not elsewhere classified: Secondary | ICD-10-CM | POA: Diagnosis not present

## 2017-10-08 DIAGNOSIS — M5416 Radiculopathy, lumbar region: Secondary | ICD-10-CM | POA: Diagnosis not present

## 2017-10-14 DIAGNOSIS — Z0000 Encounter for general adult medical examination without abnormal findings: Secondary | ICD-10-CM | POA: Diagnosis not present

## 2017-10-14 DIAGNOSIS — Z1339 Encounter for screening examination for other mental health and behavioral disorders: Secondary | ICD-10-CM | POA: Diagnosis not present

## 2017-10-14 DIAGNOSIS — S161XXS Strain of muscle, fascia and tendon at neck level, sequela: Secondary | ICD-10-CM | POA: Diagnosis not present

## 2017-10-14 DIAGNOSIS — M65819 Other synovitis and tenosynovitis, unspecified shoulder: Secondary | ICD-10-CM | POA: Diagnosis not present

## 2017-10-14 DIAGNOSIS — Z6821 Body mass index (BMI) 21.0-21.9, adult: Secondary | ICD-10-CM | POA: Diagnosis not present

## 2017-10-17 ENCOUNTER — Ambulatory Visit (INDEPENDENT_AMBULATORY_CARE_PROVIDER_SITE_OTHER): Payer: PPO | Admitting: Sports Medicine

## 2017-10-17 ENCOUNTER — Other Ambulatory Visit: Payer: Self-pay | Admitting: Sports Medicine

## 2017-10-17 ENCOUNTER — Ambulatory Visit (INDEPENDENT_AMBULATORY_CARE_PROVIDER_SITE_OTHER): Payer: PPO

## 2017-10-17 ENCOUNTER — Encounter: Payer: Self-pay | Admitting: Sports Medicine

## 2017-10-17 DIAGNOSIS — M779 Enthesopathy, unspecified: Secondary | ICD-10-CM

## 2017-10-17 DIAGNOSIS — M778 Other enthesopathies, not elsewhere classified: Secondary | ICD-10-CM

## 2017-10-17 DIAGNOSIS — M21619 Bunion of unspecified foot: Secondary | ICD-10-CM

## 2017-10-17 DIAGNOSIS — M79674 Pain in right toe(s): Secondary | ICD-10-CM | POA: Diagnosis not present

## 2017-10-17 MED ORDER — TRIAMCINOLONE ACETONIDE 10 MG/ML IJ SUSP: 10.0000 mg | Freq: Once | INTRAMUSCULAR | Status: DC

## 2017-10-17 NOTE — Progress Notes (Signed)
Subjective: Lisa [name] is a 47 y.o. female patient who presents to office for evaluation of right foot pain. Patient complains of progressive pain especially over the last over the last 3 weeks at the big toe joint reports that pain is 7 out of 10 constant in nature seems like it does not go away reports that she knows she has a bump or a bunion there however she has tried icing heat ibuprofen and Tylenol with no relief he does report prior to this toe joint hurting that she did injure her right third toe and states that after that her big toe joint started hurting.  Patient denies any other pedal complaints at this time.  Review of Systems  Musculoskeletal: Positive for joint pain.  All other systems reviewed and are negative.   There are no active problems to display for this patient.   Current Outpatient Medications on File Prior to Visit  Medication Sig Dispense Refill  . dexlansoprazole (DEXILANT) 60 MG capsule Take 60 mg daily by mouth.    . Estradiol-Estriol-Progesterone (BIEST/PROGESTERONE TD) APPLY ONE CLICK(0.25ML) TOPICALLY ONCE DAILY ROTATING SITES-INNER WRIST, INNER/OUTER THIGH, AND BACK OF KNEE  0  . FLOVENT HFA 110 MCG/ACT inhaler U 1 PUFF PO BID FOR 1 MONTH THEN 1 PUFF D X 2 MONTHS  3  . HYDROcodone-acetaminophen (NORCO) 10-325 MG tablet hydrocodone 10 mg-acetaminophen 325 mg tablet    . Melatonin 10 MG CAPS Take by mouth.    . methocarbamol (ROBAXIN) 750 MG tablet methocarbamol 750 mg tablet    . morphine (MS CONTIN) 30 MG 12 hr tablet morphine ER 30 mg tablet,extended release    . pentosan polysulfate (ELMIRON) 100 MG capsule Elmiron 100 mg capsule  TAKE 1 CAPSULE BY MOUTH THREE TIMES DAILY    . polyethylene glycol (MIRALAX / GLYCOLAX) packet Take 17 g by mouth daily.    . Progesterone Micronized (PROGESTERONE PO) TAKE ONE CAPSULE BY MOUTH 30-45 MINUTES BEFORE BEDTIME  3  . ranitidine (ZANTAC) 150 MG tablet TK 1 T PO QHS 30 tablet 6  . solifenacin (VESICARE) 10 MG  tablet TK 1 T PO D    . zonisamide (ZONEGRAN) 25 MG capsule TK 4 CS PO QHS     No current facility-administered medications on file prior to visit.     Allergies  Allergen Reactions  . Azithromycin Other (See Comments)    Unknown   . Latex Other (See Comments)    Unknown   . Metaxalone Itching    Objective:  General: Alert and oriented x3 in no acute distress  Dermatology: No open lesions bilateral lower extremities, no webspace macerations, no ecchymosis bilateral, all nails x 10 are well manicured.  Vascular: Dorsalis Pedis and Posterior Tibial pedal pulses palpable, Capillary Fill Time 3 seconds,(+) pedal hair growth bilateral, no edema bilateral lower extremities, Temperature gradient within normal limits.  Neurology: Johney Maine sensation intact via light touch bilateral. (- )Tinels sign bilateral.   Musculoskeletal: Mild tenderness with palpation at first metatarsophalangeal joint on right foot,No pain with calf compression bilateral. There is decreased ankle rom with knee extending  vs flexed resembling gastroc equnius bilateral, Subtalar joint range of motion is within normal limits, there is no 1st ray hypermobility noted bilateral, decreased 1st MPJ rom Right>Left with functional limitus with early bunion noted on weightbearing exam. Strength within normal limits in all groups bilateral.   Gait: Antalgic gait  Xrays  Right foot   Impression: Normal osseous mineralization there is no evidence of fracture  or break or no acute radiographic pathologic findings.  Assessment and Plan: Problem List Items Addressed This Visit    None    Visit Diagnoses    Capsulitis of foot, right    -  Primary   Relevant Medications   triamcinolone acetonide (KENALOG) 10 MG/ML injection 10 mg (Start on 10/17/2017 11:45 AM)   Toe pain, right       Relevant Medications   triamcinolone acetonide (KENALOG) 10 MG/ML injection 10 mg (Start on 10/17/2017 11:45 AM)   Other Relevant Orders   DG Foot  Complete Right   Bunion           -Complete examination performed -Xrays reviewed -Discussed treatement options for likely capsulitis at right great toe joint secondary to her walking differently after injuring her third toe which is currently asymptomatic -After oral consent and aseptic prep, injected a mixture containing 1 ml of 2%  plain lidocaine, 1 ml 0.5% plain marcaine, 0.5 ml of kenalog 10 and 0.5 ml of dexamethasone phosphate into right first metatarsophalangeal joint without complication. Post-injection care discussed with patient.  -Dispensed foam bunion cushion to use when in close toed shoes -Recommend rest ice elevation and good supportive shoes -Patient may use topical pain cream or rub as needed -Patient to return to office as needed or sooner if condition worsens.  Advised patient to call office if pain is not better within 2 weeks for reevaluation.  Landis Martins, DPM

## 2017-10-18 ENCOUNTER — Other Ambulatory Visit: Payer: Self-pay | Admitting: Sports Medicine

## 2017-10-18 ENCOUNTER — Telehealth: Payer: Self-pay | Admitting: *Deleted

## 2017-10-18 DIAGNOSIS — M779 Enthesopathy, unspecified: Secondary | ICD-10-CM

## 2017-10-18 DIAGNOSIS — M79674 Pain in right toe(s): Secondary | ICD-10-CM

## 2017-10-18 DIAGNOSIS — M778 Other enthesopathies, not elsewhere classified: Secondary | ICD-10-CM

## 2017-10-18 MED ORDER — DICLOFENAC SODIUM 75 MG PO TBEC: 75.0000 mg | DELAYED_RELEASE_TABLET | Freq: Two times a day (BID) | ORAL | 0 refills | Status: DC

## 2017-10-18 NOTE — Telephone Encounter (Signed)
Notified patient.

## 2017-10-18 NOTE — Progress Notes (Signed)
Send diclofenac to pharmacy for her toe pain post injection -Dr. Cannon Kettle

## 2017-10-18 NOTE — Telephone Encounter (Signed)
I sent diclofenac to her pharmacy -Dr. Chauncey Cruel

## 2017-10-18 NOTE — Telephone Encounter (Signed)
Patient states that she has been having significant pain in her toe since the injection yesterday.  Has stayed off it with it elevated most of the day.  You mentioned doing an antiinflammatory if that would help would you call it into my pharmacy [facility] thank you

## 2017-10-24 DIAGNOSIS — M25512 Pain in left shoulder: Secondary | ICD-10-CM | POA: Diagnosis not present

## 2017-10-24 DIAGNOSIS — M542 Cervicalgia: Secondary | ICD-10-CM | POA: Diagnosis not present

## 2017-10-24 DIAGNOSIS — M6281 Muscle weakness (generalized): Secondary | ICD-10-CM | POA: Diagnosis not present

## 2017-10-29 DIAGNOSIS — M6281 Muscle weakness (generalized): Secondary | ICD-10-CM | POA: Diagnosis not present

## 2017-10-29 DIAGNOSIS — M533 Sacrococcygeal disorders, not elsewhere classified: Secondary | ICD-10-CM | POA: Diagnosis not present

## 2017-10-29 DIAGNOSIS — M25512 Pain in left shoulder: Secondary | ICD-10-CM | POA: Diagnosis not present

## 2017-10-29 DIAGNOSIS — M542 Cervicalgia: Secondary | ICD-10-CM | POA: Diagnosis not present

## 2017-11-04 DIAGNOSIS — Z1231 Encounter for screening mammogram for malignant neoplasm of breast: Secondary | ICD-10-CM | POA: Diagnosis not present

## 2017-11-04 DIAGNOSIS — N905 Atrophy of vulva: Secondary | ICD-10-CM | POA: Diagnosis not present

## 2017-11-04 DIAGNOSIS — N951 Menopausal and female climacteric states: Secondary | ICD-10-CM | POA: Diagnosis not present

## 2017-11-06 DIAGNOSIS — N3090 Cystitis, unspecified without hematuria: Secondary | ICD-10-CM | POA: Diagnosis not present

## 2017-11-15 ENCOUNTER — Encounter: Payer: Self-pay | Admitting: Gastroenterology

## 2017-11-15 DIAGNOSIS — M79676 Pain in unspecified toe(s): Secondary | ICD-10-CM | POA: Diagnosis not present

## 2017-11-15 DIAGNOSIS — N912 Amenorrhea, unspecified: Secondary | ICD-10-CM | POA: Diagnosis not present

## 2017-11-15 DIAGNOSIS — Z79899 Other long term (current) drug therapy: Secondary | ICD-10-CM | POA: Diagnosis not present

## 2018-01-07 ENCOUNTER — Telehealth: Payer: Self-pay | Admitting: Gastroenterology

## 2018-01-07 DIAGNOSIS — M5136 Other intervertebral disc degeneration, lumbar region: Secondary | ICD-10-CM | POA: Diagnosis not present

## 2018-01-07 DIAGNOSIS — N3090 Cystitis, unspecified without hematuria: Secondary | ICD-10-CM | POA: Diagnosis not present

## 2018-01-07 DIAGNOSIS — M533 Sacrococcygeal disorders, not elsewhere classified: Secondary | ICD-10-CM | POA: Diagnosis not present

## 2018-01-07 DIAGNOSIS — M961 Postlaminectomy syndrome, not elsewhere classified: Secondary | ICD-10-CM | POA: Diagnosis not present

## 2018-01-07 NOTE — Telephone Encounter (Signed)
No answer-left message to call again

## 2018-01-08 NOTE — Telephone Encounter (Signed)
Patient calls back. She is having a recurrence of the symptoms she had when diagnosed with eosinophilic esophagitis. She wants to know if she should see Dr Lyndel Safe or the allergist. She also asks if this can be diagnosed by a blood test or it has to be diagnosed by biopsy.  Her symptoms are "throwing up." Encouraged to see Dr Lyndel Safe. Encouraged to discuss these questions with the doctor.

## 2018-01-21 DIAGNOSIS — I951 Orthostatic hypotension: Secondary | ICD-10-CM | POA: Diagnosis not present

## 2018-01-21 DIAGNOSIS — N3010 Interstitial cystitis (chronic) without hematuria: Secondary | ICD-10-CM | POA: Diagnosis not present

## 2018-01-21 DIAGNOSIS — G43909 Migraine, unspecified, not intractable, without status migrainosus: Secondary | ICD-10-CM | POA: Diagnosis not present

## 2018-01-21 DIAGNOSIS — K200 Eosinophilic esophagitis: Secondary | ICD-10-CM | POA: Diagnosis not present

## 2018-01-21 DIAGNOSIS — Z6820 Body mass index (BMI) 20.0-20.9, adult: Secondary | ICD-10-CM | POA: Diagnosis not present

## 2018-01-27 ENCOUNTER — Encounter: Payer: Self-pay | Admitting: Gastroenterology

## 2018-01-28 ENCOUNTER — Ambulatory Visit (INDEPENDENT_AMBULATORY_CARE_PROVIDER_SITE_OTHER): Payer: PPO | Admitting: Gastroenterology

## 2018-01-28 ENCOUNTER — Encounter: Payer: Self-pay | Admitting: Gastroenterology

## 2018-01-28 VITALS — BP 122/82 | HR 70 | Ht 66.5 in | Wt 127.2 lb

## 2018-01-28 DIAGNOSIS — R197 Diarrhea, unspecified: Secondary | ICD-10-CM

## 2018-01-28 DIAGNOSIS — R1030 Lower abdominal pain, unspecified: Secondary | ICD-10-CM

## 2018-01-28 NOTE — Patient Instructions (Signed)
You have been scheduled for a CT scan of the abdomen and pelvis at [facility] are scheduled on 02/05/18 at 2pm. You should arrive 15 minutes prior to your appointment time for registration. Please follow the written instructions below on the day of your exam:  WARNING: IF YOU ARE ALLERGIC TO IODINE/X-RAY DYE, PLEASE NOTIFY RADIOLOGY IMMEDIATELY AT [phone]! YOU WILL BE GIVEN A 13 HOUR PREMEDICATION PREP.  1) Do not eat or drink anything after 10am (4 hours prior to your test) 2) You have been given 2 bottles of oral contrast to drink. The solution may taste better if refrigerated, but do NOT add ice or any other liquid to this solution. Shake well before drinking.    Drink 1 bottle of contrast @ 12pm (2 hours prior to your exam)  Drink 1 bottle of contrast @ 1pm (1 hour prior to your exam)  You may take any medications as prescribed with a small amount of water except for the following: Metformin, Glucophage, Glucovance, Avandamet, Riomet, Fortamet, Actoplus Met, Janumet, Glumetza or Metaglip. The above medications must be held the day of the exam AND 48 hours after the exam.  The purpose of you drinking the oral contrast is to aid in the visualization of your intestinal tract. The contrast solution may cause some diarrhea. Before your exam is started, you will be given a small amount of fluid to drink. Depending on your individual set of symptoms, you may also receive an intravenous injection of x-ray contrast/dye. Plan on being at [facility] for 30 minutes or longer, depending on the type of exam you are having performed.  This test typically takes 30-45 minutes to complete.  If you have any questions regarding your exam or if you need to reschedule, you may call the CT department at [phone] between the hours of 8:00 am and 5:00 pm, Monday-Friday.  ________________________________________________________________________  Please go to the lab on the 2nd floor  [address] before you leave the office today.   Thank you,  Dr. [name]

## 2018-01-28 NOTE — Progress Notes (Signed)
Chief Complaint: FU  Referring Provider:  Mateo Flow, MD  ,     ASSESSMENT AND PLAN;   #1.  Lower abdominal pain with weight loss Neg EGD 02/2017.  #2.  IBS with predominant diarrhea.  History of C. difficile in the past.  Took antibiotics recently.  #3.  Family history of colon cancer (mom at age 48).  Negative colonoscopy 08/2016 to TI except for internal hemorrhoids.  #4.  Eosinophilic esophagitis (on EGD 02/2017), neg SB Bx for celiac.  Patient is asymptomatic on Dexilant.  Off inhalers.  Plan: - Check GI pathogen including C. difficile and WBC. - CT abdomen pelvis-noncontrast followed by contrast CT since patient is also having urinary problems and had kidney stones in the past. - Blood test results from [facility] (Dr Humphrey Rolls) - Continue Dexilant 60 mg p.o. once a day - Avoid nonsteroidals.  HPI:    Lisa [name] is a 47 y.o. female  Low Na per pt on blood tests at Dr. Marella Bile office.  Records awaited. Abdominal pain after eating - periumblical and lower abdominal, radiating to the back at times, feels full With nausea, occ vomiting Having miralax q 3 days,  having diarrhea, increased after antibiotics.  Has history of C. difficile in the past Bloating better after bowel movements but not pain. No significant nocturnal symptoms. Last 5lb over 3 weeks No odynophagia or dysphagia.  No heartburn. Dx with interstitial cystitis by urology but still having frequent urinary tract infections.  Never had cystoscopy. Muscles are sore too -never diagnosed formally with fibromyalgia. Has fatigue as well.     Past Medical History:  Diagnosis Date  . Anxiety   . Chronic back pain   . Chronic constipation   . Depression   . Eosinophilic esophagitis   . FH: colon cancer   . GERD (gastroesophageal reflux disease)   . Hiatal hernia   . Hx of Clostridium difficile infection   . IBS (irritable bowel syndrome)     Past Surgical History:  Procedure Laterality Date  . BACK  SURGERY    . CHOLECYSTECTOMY    . COLONOSCOPY  08/17/2016   Internal hemorrhoids.   . ESOPHAGOGASTRODUODENOSCOPY  03/01/2017   Gastritis.   Marland Kitchen PLACEMENT OF BREAST IMPLANTS      Family History  Problem Relation Age of Onset  . Colon cancer Mother        took 3 feet of colon   . Heart attack Maternal Aunt   . Stomach cancer Maternal Aunt   . Liver cancer Maternal Aunt   . Heart attack Maternal Uncle   . Lung cancer Maternal Uncle   . Kidney cancer Maternal Uncle   . Leukemia Paternal Aunt   . Esophageal cancer Neg Hx     Social History   Tobacco Use  . Smoking status: Former Research scientist (life sciences)  . Smokeless tobacco: Never Used  . Tobacco comment: quit 15 years ago like 2004  Substance Use Topics  . Alcohol use: Never    Frequency: Never  . Drug use: Never    Current Outpatient Medications  Medication Sig Dispense Refill  . dexlansoprazole (DEXILANT) 60 MG capsule Take 60 mg daily by mouth.    . diclofenac (VOLTAREN) 75 MG EC tablet TAKE 1 TABLET(75 MG) BY MOUTH TWICE DAILY 180 tablet 0  . Estradiol-Estriol-Progesterone (BIEST/PROGESTERONE TD) APPLY ONE CLICK(0.25ML) TOPICALLY ONCE DAILY ROTATING SITES-INNER WRIST, INNER/OUTER THIGH, AND BACK OF KNEE  0  . HYDROcodone-acetaminophen (NORCO) 10-325 MG tablet hydrocodone  10 mg-acetaminophen 325 mg tablet    . Melatonin 10 MG CAPS Take by mouth.    . methocarbamol (ROBAXIN) 750 MG tablet methocarbamol 750 mg tablet    . morphine (MS CONTIN) 30 MG 12 hr tablet morphine ER 30 mg tablet,extended release    . pentosan polysulfate (ELMIRON) 100 MG capsule Elmiron 100 mg capsule  TAKE 1 CAPSULE BY MOUTH THREE TIMES DAILY    . polyethylene glycol (MIRALAX / GLYCOLAX) packet Take 17 g by mouth daily.    . Progesterone Micronized (PROGESTERONE PO) TAKE ONE CAPSULE BY MOUTH 30-45 MINUTES BEFORE BEDTIME  3  . ranitidine (ZANTAC) 150 MG tablet TK 1 T PO QHS 30 tablet 6  . solifenacin (VESICARE) 10 MG tablet TK 1 T PO D    . zonisamide (ZONEGRAN) 25  MG capsule TK 4 CS PO QHS    . FLOVENT HFA 110 MCG/ACT inhaler U 1 PUFF PO BID FOR 1 MONTH THEN 1 PUFF D X 2 MONTHS  3   Current Facility-Administered Medications  Medication Dose Route Frequency Provider Last Rate Last Dose  . triamcinolone acetonide (KENALOG) 10 MG/ML injection 10 mg  10 mg Other Once Landis Martins, DPM        Allergies  Allergen Reactions  . Azithromycin Other (See Comments)    Unknown   . Latex Other (See Comments)    Unknown   . Metaxalone Itching    Review of Systems:  Negative except for HPI     Physical Exam:    BP 122/82   Pulse 70   Ht 5' 6.5" (1.689 m)   Wt 127 lb 4 oz (57.7 kg)   BMI 20.23 kg/m  Filed Weights   01/28/18 1518  Weight: 127 lb 4 oz (57.7 kg)   Constitutional:  Well-developed, in no acute distress. Psychiatric: Normal mood and affect. Behavior is normal. HEENT: Pupils normal.  Conjunctivae are normal. No scleral icterus. Neck supple.  Cardiovascular: Normal rate, regular rhythm. No edema Pulmonary/chest: Effort normal and breath sounds normal. No wheezing, rales or rhonchi. Abdominal: Soft, nondistended.  Left lower abdominal tenderness without rebound.  Bowel sounds active throughout. There are no masses palpable. No hepatomegaly. Rectal:  defered Neurological: Alert and oriented to person place and time. Skin: Skin is warm and dry. No rashes noted.  Data Reviewed: I have personally reviewed following labs and imaging studies  CBC: CBC Latest Ref Rng & Units 03/20/2017 09/01/2007  WBC 3.4 - 10.8 x10E3/uL 3.9 4.9  Hemoglobin 11.1 - 15.9 g/dL 13.3 11.8(L)  Hematocrit 34.0 - 46.6 % 40.0 34.1(L)  Platelets 150 - 379 x10E3/uL 284 268  Seen in presence of Docia Chuck CMA     Carmell Austria, MD 01/28/2018, 3:34 PM  Cc: Mateo Flow, MD

## 2018-02-04 DIAGNOSIS — G43909 Migraine, unspecified, not intractable, without status migrainosus: Secondary | ICD-10-CM | POA: Diagnosis not present

## 2018-02-05 ENCOUNTER — Ambulatory Visit (HOSPITAL_BASED_OUTPATIENT_CLINIC_OR_DEPARTMENT_OTHER): Admission: RE | Admit: 2018-02-05 | Payer: PPO | Source: Ambulatory Visit

## 2018-02-06 ENCOUNTER — Ambulatory Visit (HOSPITAL_BASED_OUTPATIENT_CLINIC_OR_DEPARTMENT_OTHER)
Admission: RE | Admit: 2018-02-06 | Discharge: 2018-02-06 | Disposition: A | Discharge status: Home / Self Care | Payer: PPO | Source: Ambulatory Visit | Attending: Gastroenterology | Admitting: Gastroenterology

## 2018-02-06 ENCOUNTER — Encounter (HOSPITAL_BASED_OUTPATIENT_CLINIC_OR_DEPARTMENT_OTHER): Payer: Self-pay

## 2018-02-06 ENCOUNTER — Other Ambulatory Visit (INDEPENDENT_AMBULATORY_CARE_PROVIDER_SITE_OTHER): Payer: PPO

## 2018-02-06 DIAGNOSIS — R197 Diarrhea, unspecified: Secondary | ICD-10-CM | POA: Diagnosis not present

## 2018-02-06 DIAGNOSIS — I700 Atherosclerosis of aorta: Secondary | ICD-10-CM | POA: Insufficient documentation

## 2018-02-06 DIAGNOSIS — R1030 Lower abdominal pain, unspecified: Secondary | ICD-10-CM | POA: Insufficient documentation

## 2018-02-06 DIAGNOSIS — K838 Other specified diseases of biliary tract: Secondary | ICD-10-CM | POA: Insufficient documentation

## 2018-02-06 MED ORDER — IOPAMIDOL (ISOVUE-300) INJECTION 61%
100.0000 mL | Freq: Once | INTRAVENOUS | Status: AC | PRN
  Administered 2018-02-06: 100 mL via INTRAVENOUS

## 2018-02-07 ENCOUNTER — Other Ambulatory Visit: Payer: Self-pay

## 2018-02-07 DIAGNOSIS — R109 Unspecified abdominal pain: Secondary | ICD-10-CM

## 2018-02-10 ENCOUNTER — Other Ambulatory Visit (INDEPENDENT_AMBULATORY_CARE_PROVIDER_SITE_OTHER): Payer: PPO

## 2018-02-10 DIAGNOSIS — R109 Unspecified abdominal pain: Secondary | ICD-10-CM | POA: Diagnosis not present

## 2018-02-10 LAB — COMPREHENSIVE METABOLIC PANEL
ALT: 29 U/L (ref 0–35)
AST: 24 U/L (ref 0–37)
Albumin: 4.2 g/dL (ref 3.5–5.2)
Alkaline Phosphatase: 52 U/L (ref 39–117)
BUN: 14 mg/dL (ref 6–23)
CHLORIDE: 105 meq/L (ref 96–112)
CO2: 29 mEq/L (ref 19–32)
Calcium: 9.4 mg/dL (ref 8.4–10.5)
Creatinine, Ser: 0.67 mg/dL (ref 0.40–1.20)
GFR: 99.8 mL/min (ref >60.00)
GLUCOSE: 85 mg/dL (ref 70–99)
POTASSIUM: 4.1 meq/L (ref 3.5–5.1)
SODIUM: 141 meq/L (ref 135–145)
Total Bilirubin: 0.2 mg/dL (ref 0.2–1.2)
Total Protein: 6.3 g/dL (ref 6.0–8.3)

## 2018-02-10 LAB — CBC WITH DIFFERENTIAL/PLATELET
BASOS PCT: 0.7 % (ref 0.0–3.0)
Basophils Absolute: 0 10*3/uL (ref 0.0–0.1)
EOS PCT: 8.1 % — AB (ref 0.0–5.0)
Eosinophils Absolute: 0.3 10*3/uL (ref 0.0–0.7)
HCT: 36.7 % (ref 36.0–46.0)
HEMOGLOBIN: 12.5 g/dL (ref 12.0–15.0)
LYMPHS ABS: 1.5 10*3/uL (ref 0.7–4.0)
Lymphocytes Relative: 40.1 % (ref 12.0–46.0)
MCHC: 33.9 g/dL (ref 30.0–36.0)
MCV: 98.6 fl (ref 78.0–100.0)
Monocytes Absolute: 0.3 10*3/uL (ref 0.1–1.0)
Monocytes Relative: 7.3 % (ref 3.0–12.0)
Neutro Abs: 1.6 10*3/uL (ref 1.4–7.7)
Neutrophils Relative %: 43.8 % (ref 43.0–77.0)
Platelets: 230 10*3/uL (ref 150.0–400.0)
RBC: 3.73 Mil/uL — ABNORMAL LOW (ref 3.87–5.11)
RDW: 12.4 % (ref 11.5–15.5)
WBC: 3.7 10*3/uL — AB (ref 4.0–10.5)

## 2018-02-10 LAB — LIPASE: LIPASE: 8 U/L — AB (ref 11.0–59.0)

## 2018-02-14 LAB — FECAL LACTOFERRIN, QUANT
Fecal Lactoferrin: NEGATIVE
MICRO NUMBER:: 91189932
SPECIMEN QUALITY:: ADEQUATE

## 2018-02-14 LAB — GASTROINTESTINAL PATHOGEN PANEL PCR
C. difficile Tox A/B, PCR: NOT DETECTED
Campylobacter, PCR: NOT DETECTED
Cryptosporidium, PCR: NOT DETECTED
E COLI (ETEC) LT/ST, PCR: NOT DETECTED
E coli (STEC) stx1/stx2, PCR: NOT DETECTED
E coli 0157, PCR: NOT DETECTED
Giardia lamblia, PCR: NOT DETECTED
NOROVIRUS, PCR: NOT DETECTED
ROTAVIRUS, PCR: NOT DETECTED
SALMONELLA, PCR: NOT DETECTED
SHIGELLA, PCR: NOT DETECTED

## 2018-02-17 ENCOUNTER — Other Ambulatory Visit: Payer: Self-pay

## 2018-02-17 MED ORDER — HYOSCYAMINE SULFATE 0.125 MG SL SUBL: SUBLINGUAL_TABLET | SUBLINGUAL | 2 refills | Status: DC

## 2018-04-08 DIAGNOSIS — M5136 Other intervertebral disc degeneration, lumbar region: Secondary | ICD-10-CM | POA: Diagnosis not present

## 2018-04-08 DIAGNOSIS — M5416 Radiculopathy, lumbar region: Secondary | ICD-10-CM | POA: Diagnosis not present

## 2018-04-08 DIAGNOSIS — M533 Sacrococcygeal disorders, not elsewhere classified: Secondary | ICD-10-CM | POA: Diagnosis not present

## 2018-04-08 DIAGNOSIS — Z5181 Encounter for therapeutic drug level monitoring: Secondary | ICD-10-CM | POA: Diagnosis not present

## 2018-04-21 DIAGNOSIS — N951 Menopausal and female climacteric states: Secondary | ICD-10-CM | POA: Diagnosis not present

## 2018-05-22 DIAGNOSIS — L710 Perioral dermatitis: Secondary | ICD-10-CM | POA: Diagnosis not present

## 2018-05-22 DIAGNOSIS — Z6821 Body mass index (BMI) 21.0-21.9, adult: Secondary | ICD-10-CM | POA: Diagnosis not present

## 2018-05-22 DIAGNOSIS — R7989 Other specified abnormal findings of blood chemistry: Secondary | ICD-10-CM | POA: Diagnosis not present

## 2018-05-22 DIAGNOSIS — M2550 Pain in unspecified joint: Secondary | ICD-10-CM | POA: Diagnosis not present

## 2018-05-22 DIAGNOSIS — G44209 Tension-type headache, unspecified, not intractable: Secondary | ICD-10-CM | POA: Diagnosis not present

## 2018-05-22 DIAGNOSIS — R6889 Other general symptoms and signs: Secondary | ICD-10-CM | POA: Diagnosis not present

## 2018-05-29 ENCOUNTER — Telehealth: Payer: Self-pay | Admitting: Gastroenterology

## 2018-05-29 MED ORDER — LINACLOTIDE 145 MCG PO CAPS: 145.0000 ug | ORAL_CAPSULE | Freq: Every day | ORAL | 6 refills | Status: DC

## 2018-05-29 NOTE — Telephone Encounter (Signed)
Patient states samples of linzess 145 seem to be working well and she would like a prescription sent in to [facility].

## 2018-05-29 NOTE — Telephone Encounter (Signed)
Sent prescription to patients pharmacy, patient notified. 

## 2018-05-29 NOTE — Telephone Encounter (Signed)
I do not see where we have given her samples of this, would you like to send in a prescription?

## 2018-05-29 NOTE — Telephone Encounter (Signed)
Lets call in Linzess 145, 30, 6 refills

## 2018-06-04 DIAGNOSIS — M542 Cervicalgia: Secondary | ICD-10-CM | POA: Diagnosis not present

## 2018-06-04 DIAGNOSIS — M7918 Myalgia, other site: Secondary | ICD-10-CM

## 2018-06-04 DIAGNOSIS — M5412 Radiculopathy, cervical region: Secondary | ICD-10-CM

## 2018-06-04 HISTORY — DX: Myalgia, other site: M79.18

## 2018-06-04 HISTORY — DX: Radiculopathy, cervical region: M54.12

## 2018-06-08 ENCOUNTER — Other Ambulatory Visit: Payer: Self-pay | Admitting: Gastroenterology

## 2018-06-12 DIAGNOSIS — M5412 Radiculopathy, cervical region: Secondary | ICD-10-CM | POA: Diagnosis not present

## 2018-06-12 DIAGNOSIS — M533 Sacrococcygeal disorders, not elsewhere classified: Secondary | ICD-10-CM | POA: Diagnosis not present

## 2018-06-12 DIAGNOSIS — M5136 Other intervertebral disc degeneration, lumbar region: Secondary | ICD-10-CM | POA: Diagnosis not present

## 2018-06-16 ENCOUNTER — Other Ambulatory Visit: Payer: Self-pay | Admitting: Gastroenterology

## 2018-06-18 DIAGNOSIS — M50323 Other cervical disc degeneration at C6-C7 level: Secondary | ICD-10-CM | POA: Diagnosis not present

## 2018-06-18 DIAGNOSIS — M50122 Cervical disc disorder at C5-C6 level with radiculopathy: Secondary | ICD-10-CM | POA: Diagnosis not present

## 2018-06-18 DIAGNOSIS — M5412 Radiculopathy, cervical region: Secondary | ICD-10-CM | POA: Diagnosis not present

## 2018-07-03 ENCOUNTER — Telehealth: Payer: Self-pay | Admitting: Gastroenterology

## 2018-07-03 DIAGNOSIS — M5412 Radiculopathy, cervical region: Secondary | ICD-10-CM | POA: Diagnosis not present

## 2018-07-03 DIAGNOSIS — M533 Sacrococcygeal disorders, not elsewhere classified: Secondary | ICD-10-CM | POA: Diagnosis not present

## 2018-07-03 DIAGNOSIS — M7918 Myalgia, other site: Secondary | ICD-10-CM | POA: Diagnosis not present

## 2018-07-03 NOTE — Telephone Encounter (Signed)
Please switch to pepcid 40mg  po qd, #30, 11 refills

## 2018-07-03 NOTE — Telephone Encounter (Signed)
Pt advised that her med ranitidine (ZANTAC) is out and would like to know if she can get a refill. Sent to the same pharmacy on file.

## 2018-07-03 NOTE — Telephone Encounter (Signed)
Would you like to switch this med?

## 2018-07-04 MED ORDER — FAMOTIDINE 40 MG PO TABS: 40.0000 mg | ORAL_TABLET | Freq: Every day | ORAL | 11 refills | Status: DC

## 2018-07-04 NOTE — Telephone Encounter (Signed)
Sent prescription to pharmacy, left patient a message to call me back.

## 2018-07-05 DIAGNOSIS — M2550 Pain in unspecified joint: Secondary | ICD-10-CM | POA: Diagnosis not present

## 2018-07-05 DIAGNOSIS — K219 Gastro-esophageal reflux disease without esophagitis: Secondary | ICD-10-CM | POA: Diagnosis not present

## 2018-07-05 DIAGNOSIS — F339 Major depressive disorder, recurrent, unspecified: Secondary | ICD-10-CM | POA: Diagnosis not present

## 2018-07-08 DIAGNOSIS — M5412 Radiculopathy, cervical region: Secondary | ICD-10-CM | POA: Diagnosis not present

## 2018-08-05 DIAGNOSIS — M533 Sacrococcygeal disorders, not elsewhere classified: Secondary | ICD-10-CM | POA: Diagnosis not present

## 2018-08-05 DIAGNOSIS — M5412 Radiculopathy, cervical region: Secondary | ICD-10-CM | POA: Diagnosis not present

## 2018-09-10 DIAGNOSIS — L821 Other seborrheic keratosis: Secondary | ICD-10-CM | POA: Diagnosis not present

## 2018-09-10 DIAGNOSIS — D225 Melanocytic nevi of trunk: Secondary | ICD-10-CM | POA: Diagnosis not present

## 2018-09-10 DIAGNOSIS — D2371 Other benign neoplasm of skin of right lower limb, including hip: Secondary | ICD-10-CM | POA: Diagnosis not present

## 2018-09-10 DIAGNOSIS — L578 Other skin changes due to chronic exposure to nonionizing radiation: Secondary | ICD-10-CM | POA: Diagnosis not present

## 2018-09-15 DIAGNOSIS — M533 Sacrococcygeal disorders, not elsewhere classified: Secondary | ICD-10-CM | POA: Diagnosis not present

## 2018-09-15 DIAGNOSIS — M5412 Radiculopathy, cervical region: Secondary | ICD-10-CM | POA: Diagnosis not present

## 2018-09-15 DIAGNOSIS — M5416 Radiculopathy, lumbar region: Secondary | ICD-10-CM | POA: Diagnosis not present

## 2018-10-24 DIAGNOSIS — M5412 Radiculopathy, cervical region: Secondary | ICD-10-CM | POA: Diagnosis not present

## 2018-10-24 DIAGNOSIS — M5136 Other intervertebral disc degeneration, lumbar region: Secondary | ICD-10-CM | POA: Diagnosis not present

## 2018-10-24 DIAGNOSIS — M5416 Radiculopathy, lumbar region: Secondary | ICD-10-CM | POA: Diagnosis not present

## 2018-11-05 DIAGNOSIS — Z6821 Body mass index (BMI) 21.0-21.9, adult: Secondary | ICD-10-CM | POA: Diagnosis not present

## 2018-11-05 DIAGNOSIS — M2550 Pain in unspecified joint: Secondary | ICD-10-CM | POA: Diagnosis not present

## 2018-11-05 DIAGNOSIS — G43909 Migraine, unspecified, not intractable, without status migrainosus: Secondary | ICD-10-CM | POA: Diagnosis not present

## 2018-12-08 NOTE — Progress Notes (Signed)
Office Visit Note  Patient: Lisa [name]             Date of Birth: Jul 07, 1970           MRN: [id number]             PCP: Mateo Flow, MD Referring: Mateo Flow, MD Visit Date: 12/09/2018 Occupation: @GUAROCC @  Subjective:  Pain in both hands.   History of Present Illness: Lisa [name] is a 48 y.o. female seen in consultation per request of her PCP.  According to patient she has had pain in her lower back and her knee joints for many years.  She states in 2009 she underwent lumbar spine fusion by Dr. Louann Sjogren.  She had repeat fusion and 2010.  She also has disc disease of cervical spine.  She has had knee joint discomfort for many years.  She states in the last 1 year she has been experiencing increased pain in her both elbows, both wrist joints in her hands.  She had history of left elbow fracture and right wrist fracture in the past.  She states she has occasional swelling in her hands and wrist joints.  She is very stiff in the morning and even at night.  Activities of Daily Living:  Patient reports morning stiffness for 30 minutes.   Patient Reports nocturnal pain.  Difficulty dressing/grooming: Denies Difficulty climbing stairs: Denies Difficulty getting out of chair: Denies Difficulty using hands for taps, buttons, cutlery, and/or writing: Reports  Review of Systems  Constitutional: Negative for fatigue, night sweats, weight gain and weight loss.  HENT: Negative for mouth sores, trouble swallowing, trouble swallowing, mouth dryness and nose dryness.   Eyes: Positive for dryness. Negative for pain, redness and visual disturbance.  Respiratory: Negative for cough, shortness of breath, wheezing and difficulty breathing.   Cardiovascular: Negative for chest pain, palpitations, hypertension, irregular heartbeat and swelling in legs/feet.  Gastrointestinal: Positive for constipation. Negative for abdominal pain, blood in stool and diarrhea.  Endocrine: Negative for increased  urination.  Genitourinary: Negative for painful urination and vaginal dryness.  Musculoskeletal: Positive for arthralgias, joint pain, joint swelling and morning stiffness. Negative for myalgias, muscle weakness, muscle tenderness and myalgias.  Skin: Negative for color change, rash, hair loss, redness, skin tightness, ulcers and sensitivity to sunlight.  Allergic/Immunologic: Negative for susceptible to infections.  Neurological: Positive for headaches. Negative for dizziness, light-headedness, memory loss, night sweats and weakness.  Hematological: Negative for bruising/bleeding tendency and swollen glands.  Psychiatric/Behavioral: Negative for depressed mood, confusion and sleep disturbance. The patient is not nervous/anxious.     PMFS History:  There are no active problems to display for this patient.   Past Medical History:  Diagnosis Date   Anxiety    Chronic back pain    Chronic constipation    Depression    Eosinophilic esophagitis    FH: colon cancer    GERD (gastroesophageal reflux disease)    Hiatal hernia    Hx of Clostridium difficile infection    IBS (irritable bowel syndrome)     Family History  Problem Relation Age of Onset   Colon cancer Mother        took 3 feet of colon    Rheumatic fever Mother        age 82-13.5   Heart attack Maternal Aunt    Stomach cancer Maternal Aunt    Liver cancer Maternal Aunt    Heart attack Maternal Uncle    Lung cancer  Maternal Uncle    Kidney cancer Maternal Uncle    Leukemia Paternal Aunt    Alcoholism Father    Alcoholism Brother    Hypertension Brother    Esophageal cancer Neg Hx    Past Surgical History:  Procedure Laterality Date   BACK SURGERY     CHOLECYSTECTOMY     COLONOSCOPY  08/17/2016   Internal hemorrhoids.    ESOPHAGOGASTRODUODENOSCOPY  03/01/2017   Gastritis.    PLACEMENT OF BREAST IMPLANTS     Social History   Social History Narrative   Not on file    There is no  immunization history on file for this patient.   Objective: Vital Signs: BP 113/68 (BP Location: Left Arm, Patient Position: Sitting, Cuff Size: Normal)    Pulse 66    Resp 12    Ht 5' 6.5" (1.689 m)    Wt 130 lb 3.2 oz (59.1 kg)    BMI 20.70 kg/m    Physical Exam Vitals signs and nursing note reviewed.  Constitutional:      Appearance: She is well-developed.  HENT:     Head: Normocephalic and atraumatic.  Eyes:     Conjunctiva/sclera: Conjunctivae normal.  Neck:     Musculoskeletal: Normal range of motion.  Cardiovascular:     Rate and Rhythm: Normal rate and regular rhythm.     Heart sounds: Normal heart sounds.  Pulmonary:     Effort: Pulmonary effort is normal.     Breath sounds: Normal breath sounds.  Abdominal:     General: Bowel sounds are normal.     Palpations: Abdomen is soft.  Lymphadenopathy:     Cervical: No cervical adenopathy.  Skin:    General: Skin is warm and dry.     Capillary Refill: Capillary refill takes less than 2 seconds.  Neurological:     Mental Status: She is alert and oriented to person, place, and time.  Psychiatric:        Behavior: Behavior normal.      Musculoskeletal Exam: C-spine good range of motion with some stiffness.  Limited range of motion of her lumbar spine.  Shoulder joints with good range of motion.  She had good range of motion of bilateral elbow joints with tenderness over lateral epicondyle area consistent with lateral epicondylitis.  She has PIP and DIP thickening with no synovitis.  No synovitis was noted over the wrist joints.  No nailbed capillary changes were noted.  No raynauds phenomenon was noted.  Hip joints, knee joints, ankles were in good range of motion with no synovitis.  CDAI Exam: CDAI Score: -- Patient Global: --; Provider Global: -- Swollen: --; Tender: -- Joint Exam   No joint exam has been documented for this visit   There is currently no information documented on the homunculus. Go to the Rheumatology  activity and complete the homunculus joint exam.  Investigation: No additional findings.  Imaging: Xr Hand 2 View Left  Result Date: 12/09/2018 Mild PIP and DIP narrowing was noted.  No MCP, intercarpal or radiocarpal joint space narrowing was noted.  No erosive changes were noted. Impression: These findings are consistent mild osteoarthritis of the hand.  Xr Hand 2 View Right  Result Date: 12/09/2018 Minimal PIP and DIP narrowing was noted.  No MCP, intercarpal radiocarpal joint space narrowing was noted.  No erosive changes were noted. Impression: These findings are consistent with mild osteoarthritis of the hand.   Recent Labs: Lab Results  Component Value Date  WBC 3.7 (L) 02/10/2018   HGB 12.5 02/10/2018   PLT 230.0 02/10/2018   NA 141 02/10/2018   K 4.1 02/10/2018   CL 105 02/10/2018   CO2 29 02/10/2018   GLUCOSE 85 02/10/2018   BUN 14 02/10/2018   CREATININE 0.67 02/10/2018   BILITOT 0.2 02/10/2018   ALKPHOS 52 02/10/2018   AST 24 02/10/2018   ALT 29 02/10/2018   PROT 6.3 02/10/2018   ALBUMIN 4.2 02/10/2018   CALCIUM 9.4 02/10/2018    Speciality Comments: No specialty comments available.  Procedures:  No procedures performed Allergies: Azithromycin, Contrast media [iodinated diagnostic agents], Latex, Metaxalone, and Shellfish allergy   Assessment / Plan:     Visit Diagnoses: Pain in both hands -patient gives history of pain in her bilateral hands and wrist.  She also gives history of intermittent swelling.  No synovitis was noted on examination.  She gives history of significant morning stiffness.  Clinical findings and radiographic findings are consistent with osteoarthritis of the hand.  Joint protection and muscle strengthening was discussed.  A handout on hand exercises was given.  A list of natural anti-inflammatories was given.  Use of Voltaren gel was discussed.  Plan: XR Hand 2 View Right, XR Hand 2 View Left, Uric acid, 14-3-3 eta Protein, Cyclic citrul  peptide antibody, IgG, ANA,   Lateral epicondylitis, bilateral -I have given her a handout on elbow exercises and also offered bilateral tennis elbow brace.  Positive ANA (antinuclear antibody) - 05/22/18: ANA+, RF<10, sed rate 3, dsDNA 1, smith <0.2, Ro-, La- - Plan: ANA titer.  Polyarthralgia -patient gives history of chronic pain in multiple joints including her cervical spine, lumbar spine, bilateral elbows, bilateral wrists, bilateral hands, her knee joints.  No synovitis was noted.  DDD (degenerative disc disease), cervical - Mild, MRI done February 01, 2017 -she complains of chronic cervical spine discomfort with intermittent radiculopathy to her right arm.  DDD (degenerative disc disease), lumbar - s/p fusion 2009,2010 - chronic pain -she is on pain medications.  Interstitial cystitis -still continues to be active.  History of migraine  History of gastroesophageal reflux (GERD)   History of endometriosis   History of Clostridioides difficile infection - recurrent,last infection 2 years ago.  Patient reports recurrent C. difficile infection with the use of antibiotics.  Orders: Orders Placed This Encounter  Procedures   XR Hand 2 View Right   XR Hand 2 View Left   Uric acid   14-3-3 eta Protein   Cyclic citrul peptide antibody, IgG   ANA   No orders of the defined types were placed in this encounter.   Face-to-face time spent with patient was 50 minutes. Greater than 50% of time was spent in counseling and coordination of care.  Follow-Up Instructions: Return for Osteoarthritis.   Bo Merino, MD  Note - This record has been created using Editor, commissioning.  Chart creation errors have been sought, but may not always  have been located. Such creation errors do not reflect on  the standard of medical care.

## 2018-12-09 ENCOUNTER — Other Ambulatory Visit: Payer: Self-pay

## 2018-12-09 ENCOUNTER — Ambulatory Visit: Payer: Self-pay

## 2018-12-09 ENCOUNTER — Ambulatory Visit (INDEPENDENT_AMBULATORY_CARE_PROVIDER_SITE_OTHER): Payer: PPO

## 2018-12-09 ENCOUNTER — Ambulatory Visit: Payer: PPO | Admitting: Rheumatology

## 2018-12-09 ENCOUNTER — Encounter: Payer: Self-pay | Admitting: Rheumatology

## 2018-12-09 VITALS — BP 113/68 | HR 66 | Resp 12 | Ht 66.5 in | Wt 130.2 lb

## 2018-12-09 DIAGNOSIS — M2550 Pain in unspecified joint: Secondary | ICD-10-CM | POA: Diagnosis not present

## 2018-12-09 DIAGNOSIS — N3010 Interstitial cystitis (chronic) without hematuria: Secondary | ICD-10-CM

## 2018-12-09 DIAGNOSIS — M79641 Pain in right hand: Secondary | ICD-10-CM

## 2018-12-09 DIAGNOSIS — R768 Other specified abnormal immunological findings in serum: Secondary | ICD-10-CM | POA: Diagnosis not present

## 2018-12-09 DIAGNOSIS — M5136 Other intervertebral disc degeneration, lumbar region: Secondary | ICD-10-CM

## 2018-12-09 DIAGNOSIS — M5030 Other cervical disc degeneration, unspecified cervical region: Secondary | ICD-10-CM

## 2018-12-09 DIAGNOSIS — Z8719 Personal history of other diseases of the digestive system: Secondary | ICD-10-CM

## 2018-12-09 DIAGNOSIS — M79642 Pain in left hand: Secondary | ICD-10-CM

## 2018-12-09 DIAGNOSIS — Z8619 Personal history of other infectious and parasitic diseases: Secondary | ICD-10-CM

## 2018-12-09 DIAGNOSIS — Z8742 Personal history of other diseases of the female genital tract: Secondary | ICD-10-CM

## 2018-12-09 DIAGNOSIS — Z8669 Personal history of other diseases of the nervous system and sense organs: Secondary | ICD-10-CM

## 2018-12-09 DIAGNOSIS — M7712 Lateral epicondylitis, left elbow: Secondary | ICD-10-CM | POA: Diagnosis not present

## 2018-12-09 NOTE — Patient Instructions (Signed)
Hand Exercises Hand exercises can be helpful for almost anyone. These exercises can strengthen the hands, improve flexibility and movement, and increase blood flow to the hands. These results can make work and daily tasks easier. Hand exercises can be especially helpful for people who have joint pain from arthritis or have nerve damage from overuse (carpal tunnel syndrome). These exercises can also help people who have injured a hand. Exercises Most of these hand exercises are gentle stretching and motion exercises. It is usually safe to do them often throughout the day. Warming up your hands before exercise may help to reduce stiffness. You can do this with gentle massage or by placing your hands in warm water for 10-15 minutes. It is normal to feel some stretching, pulling, tightness, or mild discomfort as you begin new exercises. This will gradually improve. Stop an exercise right away if you feel sudden, severe pain or your pain gets worse. Ask your health care provider which exercises are best for you. Knuckle bend or "claw" fist 1. Stand or sit with your arm, hand, and all five fingers pointed straight up. Make sure to keep your wrist straight during the exercise. 2. Gently bend your fingers down toward your palm until the tips of your fingers are touching the top of your palm. Keep your big knuckle straight and just bend the small knuckles in your fingers. 3. Hold this position for __________ seconds. 4. Straighten (extend) your fingers back to the starting position. Repeat this exercise 5-10 times with each hand. Full finger fist 1. Stand or sit with your arm, hand, and all five fingers pointed straight up. Make sure to keep your wrist straight during the exercise. 2. Gently bend your fingers into your palm until the tips of your fingers are touching the middle of your palm. 3. Hold this position for __________ seconds. 4. Extend your fingers back to the starting position, stretching every  joint fully. Repeat this exercise 5-10 times with each hand. Straight fist 1. Stand or sit with your arm, hand, and all five fingers pointed straight up. Make sure to keep your wrist straight during the exercise. 2. Gently bend your fingers at the big knuckle, where your fingers meet your hand, and the middle knuckle. Keep the knuckle at the tips of your fingers straight and try to touch the bottom of your palm. 3. Hold this position for __________ seconds. 4. Extend your fingers back to the starting position, stretching every joint fully. Repeat this exercise 5-10 times with each hand. Tabletop 1. Stand or sit with your arm, hand, and all five fingers pointed straight up. Make sure to keep your wrist straight during the exercise. 2. Gently bend your fingers at the big knuckle, where your fingers meet your hand, as far down as you can while keeping the small knuckles in your fingers straight. Think of forming a tabletop with your fingers. 3. Hold this position for __________ seconds. 4. Extend your fingers back to the starting position, stretching every joint fully. Repeat this exercise 5-10 times with each hand. Finger spread 1. Place your hand flat on a table with your palm facing down. Make sure your wrist stays straight as you do this exercise. 2. Spread your fingers and thumb apart from each other as far as you can until you feel a gentle stretch. Hold this position for __________ seconds. 3. Bring your fingers and thumb tight together again. Hold this position for __________ seconds. Repeat this exercise 5-10 times with each hand.   Making circles 1. Stand or sit with your arm, hand, and all five fingers pointed straight up. Make sure to keep your wrist straight during the exercise. 2. Make a circle by touching the tip of your thumb to the tip of your index finger. 3. Hold for __________ seconds. Then open your hand wide. 4. Repeat this motion with your thumb and each finger on your hand.  Repeat this exercise 5-10 times with each hand. Thumb motion 1. Sit with your forearm resting on a table and your wrist straight. Your thumb should be facing up toward the ceiling. Keep your fingers relaxed as you move your thumb. 2. Lift your thumb up as high as you can toward the ceiling. Hold for __________ seconds. 3. Bend your thumb across your palm as far as you can, reaching the tip of your thumb for the small finger (pinkie) side of your palm. Hold for __________ seconds. Repeat this exercise 5-10 times with each hand. Grip strengthening  1. Hold a stress ball or other soft ball in the middle of your hand. 2. Slowly increase the pressure, squeezing the ball as much as you can without causing pain. Think of bringing the tips of your fingers into the middle of your palm. All of your finger joints should bend when doing this exercise. 3. Hold your squeeze for __________ seconds, then relax. Repeat this exercise 5-10 times with each hand. Contact a health care provider if:  Your hand pain or discomfort gets much worse when you do an exercise.  Your hand pain or discomfort does not improve within 2 hours after you exercise. If you have any of these problems, stop doing these exercises right away. Do not do them again unless your health care provider says that you can. Get help right away if:  You develop sudden, severe hand pain or swelling. If this happens, stop doing these exercises right away. Do not do them again unless your health care provider says that you can. This information is not intended to replace advice given to you by your health care provider. Make sure you discuss any questions you have with your health care provider. Document Released: 04/04/2015 Document Revised: 08/14/2018 Document Reviewed: 04/24/2018 Elsevier Patient Education  [place]. Elbow and Forearm Exercises Ask your health care provider which exercises are safe for you. Do exercises exactly as  told by your health care provider and adjust them as directed. It is normal to feel mild stretching, pulling, tightness, or discomfort as you do these exercises. Stop right away if you feel sudden pain or your pain gets worse. Do not begin these exercises until told by your health care provider. Range-of-motion exercises These exercises warm up your muscles and joints and improve the movement and flexibility of your injured elbow and forearm. The exercises also help to relieve pain, numbness, and tingling. These exercises are done using the muscles in your injured elbow and forearm (active). Elbow flexion, active 1. Hold your left / right arm at your side, and bend your elbow (flexion) as far as you can using only your arm muscles. 2. Hold this position for __________ seconds. 3. Slowly return to the starting position. Repeat __________ times. Complete this exercise __________ times a day. Elbow extension, active 1. Hold your left / right arm at your side, and straighten your elbow (extension) as much as you can using only your arm muscles. 2. Hold this position for __________ seconds. 3. Slowly return to the starting position. Repeat __________  times. Complete this exercise __________ times a day. Active forearm rotation, supination This is an exercise in which you turn (rotate) your forearm palm up (supination). 1. Stand or sit with your elbows at your sides. 2. Bend your left / right elbow to a 90-degree angle (right angle). 3. Rotate your palm up until you feel a gentle stretch on the inside of your forearm. 4. Hold this position for __________ seconds. 5. Slowly return to the starting position. Repeat __________ times. Complete this exercise __________ times a day. Active forearm rotation, pronation This is an exercise in which you turn (rotate) your forearm palm down (pronation). 1. Stand or sit with your elbows at your sides. 2. Bend your left / right elbow to a 90-degree angle (right  angle). 3. Rotate your palm down until you feel a gentle stretch on the top of your forearm. 4. Hold this position for __________ seconds. 5. Slowly return to the starting position. Repeat __________ times. Complete this exercise __________ times a day. Stretching exercises These exercises warm up your muscles and joints and improve the movement and flexibility of your injured elbow and forearm. These exercises also help to relieve pain, numbness, and tingling. These exercises are done using your healthy elbow and forearm to help stretch the muscles in your injured elbow and forearm (active-assisted). Elbow flexion, active-assisted  1. Hold your left / right arm at your side, and bend your elbow (flexion) as much as you can using your left / right arm muscles. 2. Use your other hand to bend your left / right elbow farther. To do this, gently push up on your forearm until you feel a gentle stretch on the back of your elbow. 3. Hold this position for __________ seconds. 4. Slowly return to the starting position. Repeat __________ times. Complete this exercise __________ times a day. Elbow extension, active-assisted  1. Hold your left / right arm at your side, and straighten your elbow (extension) as much as you can using your left / right arm muscles. 2. Use your other hand to straighten the left / right elbow farther. To do this, gently push down on your forearm until you feel a gentle stretch on the inside of your elbow. 3. Hold this position for __________ seconds. 4. Slowly return to the starting position. Repeat __________ times. Complete this exercise __________ times a day. Active-assisted forearm rotation, supination This is an exercise in which you turn (rotate) your forearm palm up (supination). 1. Sit with your left / right elbow bent in a 90-degree angle (right angle) with your forearm resting on a table. 2. Keeping your upper body and shoulder still, rotate your forearm so your  palm faces upward. 3. Use your other hand to help rotate your forearm further until you feel a gentle to moderate stretch. 4. Hold this position for __________ seconds. 5. Slowly release the stretch and return to the starting position. Repeat __________ times. Complete this exercise __________ times a day. Active-assisted forearm rotation, pronation This is an exercise in which you turn (rotate) your forearm palm down (pronation). 1. Sit with your left / right elbow bent in a 90-degree angle (right angle) with your forearm resting on a table. 2. Keeping your upper body and shoulder still, rotate your forearm so your palm faces the tabletop. 3. Use your other hand to help rotate your forearm further until you feel a gentle to moderate stretch. 4. Hold this position for __________ seconds. 5. Slowly release the stretch and return to  the starting position. Repeat __________ times. Complete this exercise __________ times a day. Passive elbow flexion, supine 1. Lie on your back (supine position). 2. Extend your left / right arm up in the air, bracing it with your other hand. 3. Let your left / right hand slowly lower toward your shoulder (passive flexion), while your elbow stays pointed toward the ceiling. You should feel a gentle stretch along the back of your upper arm and elbow. 4. If instructed by your health care provider, you may increase the intensity of your stretch by adding a small wrist weight or hand weight. 5. Hold this position for __________ seconds. 6. Slowly return to the starting position. Repeat __________ times. Complete this exercise __________ times a day. Passive elbow extension, supine  1. Lie on your back (supine position). Make sure that you are in a comfortable position that lets you relax your arm muscles. 2. Place a folded towel under your left / right upper arm so your elbow and shoulder are at the same height. Straighten your left / right arm so your elbow does not  rest on the bed or towel. 3. Let the weight of your hand stretch your elbow (passive extension). Keep your arm and chest muscles relaxed. You should feel a stretch on the inside of your elbow. 4. If told by your health care provider, you may increase the intensity of your stretch by adding a small wrist weight or hand weight. 5. Hold this position for __________ seconds. 6. Slowly release the stretch. Repeat __________ times. Complete this exercise __________ times a day. Strengthening exercises These exercises build strength and endurance in your elbow and forearm. Endurance is the ability to use your muscles for a long time, even after they get tired. Elbow flexion, isometric  1. Stand or sit up straight. 2. Bend your left / right elbow in a 90-degree angle (right angle), and keep your forearm at the height of your waist. Your thumb should be pointed toward the ceiling (neutral forearm). 3. Place your other hand on top of your left / right forearm. Gently push down while you resist with your left / right arm (isometric flexion). Push as hard as you can with both arms without causing any pain or movement at your left / right elbow. 4. Hold this position for __________ seconds. 5. Slowly release the tension in both arms. Let your muscles relax completely before you repeat the exercise. Repeat __________ times. Complete this exercise __________ times a day. Elbow extension, isometric  1. Stand or sit up straight. 2. Place your left / right arm so your palm faces your abdomen and is at the height of your waist. 3. Place your other hand on the underside of your left / right forearm. Gently push up while you resist with your left / right arm (isometric extension). Push as hard as you can with both arms without causing any pain or movement at your left / right elbow. 4. Hold this position for __________ seconds. 5. Slowly release the tension in both arms. Let your muscles relax completely before you  repeat the exercise. Repeat __________ times. Complete this exercise __________ times a day. Elbow flexion with forearm palm up  1. Sit on a firm chair without armrests, or stand up. 2. Place your left / right arm at your side with your elbow straight and your palm facing forward. 3. Holding a __________weight or gripping a rubber exercise band or tubing, bend your elbow to bring your hand  toward your shoulder (flexion). 4. Hold this position for __________ seconds. 5. Slowly return to the starting position. Repeat __________ times. Complete this exercise __________ times a day. Elbow extension, active  1. Sit on a firm chair without armrests, or stand up. 2. Hold a rubber exercise band or tubing in both hands. 3. Keeping your upper arms at your sides, bring both hands up to your left / right shoulder. Keep your left / right hand just below your other hand. 4. Straighten your left / right elbow (extension) while keeping your other arm still. 5. Hold this position for __________ seconds. 6. Control the resistance of the band or tubing as you return to the starting position. Repeat __________ times. Complete this exercise __________ times a day. Forearm rotation, supination  1. Sit with your left / right forearm supported on a table. Your elbow should be at waist height and bent at a 90-degree angle (right angle). 2. Gently grasp a lightweight hammer. 3. Rest your hand over the edge of the table with your palm facing down. 4. Without moving your left / right elbow, slowly rotate your forearm to turn your palm up toward the ceiling (supination). 5. Hold this position for __________ seconds. 6. Slowly return to the starting position. Repeat __________ times. Complete this exercise __________ times a day. Forearm rotation, pronation  1. Sit with your left / right forearm supported on a table. Keep your elbow below shoulder height. 2. Gently grasp a lightweight hammer. 3. Rest your hand  over the edge of the table with your palm facing up. 4. Without moving your left / right elbow, slowly rotate your forearm to turn your palm down toward the floor (pronation). 5. Hold this position for __________seconds. 6. Slowly return to the starting position. Repeat __________ times. Complete this exercise __________ times a day. This information is not intended to replace advice given to you by your health care provider. Make sure you discuss any questions you have with your health care provider. Document Released: 03/07/2005 Document Revised: 08/14/2018 Document Reviewed: 05/14/2018 Elsevier Patient Education  2020 [employer].

## 2018-12-09 NOTE — Progress Notes (Signed)
Pharmacy Note  Subjective:  Patient presents today to the [facility] to see Dr. Estanislado Pandy.   Patient seen by the pharmacist for counseling on natural anti-inflammatories for osteoarthritis.  Objective: Current Outpatient Medications on File Prior to Visit  Medication Sig Dispense Refill  . DEXILANT 60 MG capsule TAKE 1 CAPSULE BY MOUTH DAILY 30 capsule 6  . diclofenac (VOLTAREN) 75 MG EC tablet TAKE 1 TABLET(75 MG) BY MOUTH TWICE DAILY 180 tablet 0  . famotidine (PEPCID) 40 MG tablet Take 1 tablet (40 mg total) by mouth daily. 30 tablet 11  . HYDROcodone-acetaminophen (NORCO) 10-325 MG tablet Take 1 tablet by mouth every 4 (four) hours as needed.     . linaclotide (LINZESS) 145 MCG CAPS capsule Take 1 capsule (145 mcg total) by mouth daily before breakfast. 30 capsule 6  . Melatonin 10 MG CAPS Take by mouth.    . methocarbamol (ROBAXIN) 750 MG tablet 2 (two) times daily.     Marland Kitchen morphine (MS CONTIN) 30 MG 12 hr tablet 3 (three) times daily.     . pentosan polysulfate (ELMIRON) 100 MG capsule as needed.     . polyethylene glycol (MIRALAX / GLYCOLAX) packet Take 17 g by mouth as needed.     . solifenacin (VESICARE) 10 MG tablet TK 1 T PO D    . zonisamide (ZONEGRAN) 25 MG capsule TK 4 CS PO QHS    . Estradiol-Estriol-Progesterone (BIEST/PROGESTERONE TD) APPLY ONE CLICK(0.25ML) TOPICALLY ONCE DAILY ROTATING SITES-INNER WRIST, INNER/OUTER THIGH, AND BACK OF KNEE  0  . FLOVENT HFA 110 MCG/ACT inhaler U 1 PUFF PO BID FOR 1 MONTH THEN 1 PUFF D X 2 MONTHS  3  . hyoscyamine (LEVSIN SL) 0.125 MG SL tablet Take 1-2 tabs SL as needed every 4-6 hours for abdominal pain (Patient not taking: Reported on 12/09/2018) 120 tablet 2  . Progesterone Micronized (PROGESTERONE PO) TAKE ONE CAPSULE BY MOUTH 30-45 MINUTES BEFORE BEDTIME  3  . ranitidine (ZANTAC) 150 MG tablet TK 1 T PO QHS (Patient not taking: Reported on 12/09/2018) 30 tablet 6   Current Facility-Administered Medications on File Prior to  Visit  Medication Dose Route Frequency Provider Last Rate Last Dose  . triamcinolone acetonide (KENALOG) 10 MG/ML injection 10 mg  10 mg Other Once Landis Martins, DPM         Assessment/Plan:  Counseled on the purpose, proper use, and adverse effects of natural anti-inflammatories including upset stomach and increased bleeding risk.  Encouraged patient to add one medication at a time and to include on medication list to monitor for adverse effects and drug interactions.  Given educational handout with recommended doses.  All questions encouraged and answered.  Instructed patient to call with any further questions or concerns.  Mariella Saa, PharmD, [place], [place] [facility] Pharmacist [phone]  12/09/2018 9:02 AM

## 2018-12-13 LAB — URIC ACID: Uric Acid, Serum: 3.3 mg/dL (ref 2.5–7.0)

## 2018-12-13 LAB — CYCLIC CITRUL PEPTIDE ANTIBODY, IGG: Cyclic Citrullin Peptide Ab: <16 UNITS

## 2018-12-13 LAB — ANA: Anti Nuclear Antibody (ANA): NEGATIVE

## 2018-12-13 LAB — 14-3-3 ETA PROTEIN: 14-3-3 eta Protein: <0.2 ng/mL (ref <0.2)

## 2018-12-18 ENCOUNTER — Telehealth: Payer: Self-pay | Admitting: *Deleted

## 2018-12-18 NOTE — Telephone Encounter (Signed)
Pt called stating she would like the results of her bloodwork. Please call back at [phone]

## 2018-12-19 NOTE — Telephone Encounter (Signed)
Left message to advise patient lab results will be discussed at her new patient follow up.

## 2018-12-19 NOTE — Progress Notes (Signed)
Office Visit Note  Patient: Lisa [name]             Date of Birth: 04/10/1971           MRN: [id number]             PCP: Mateo Flow, MD Referring: Mateo Flow, MD Visit Date: 01/02/2019 Occupation: @GUAROCC @  Subjective:  Pain in multiple joints.   History of Present Illness: Lisa [name] is a 48 y.o. female with history of osteoarthritis and degenerative disc disease.  She is doing continues to have pain and stiffness in her hands.  She has been experiencing increased discomfort in her left wrist.  She states her bilateral epicondyle area are still painful.  She has not started doing the exercises yet.  She has been having left cervical spine pain and left-sided radiculopathy.  She is scheduled to have an injection in the cervical spine.  She also has chronic lower back pain due to disc disease.  She denies any joint swelling.  Activities of Daily Living:  Patient reports morning stiffness for several hours.   Patient Reports nocturnal pain.  Difficulty dressing/grooming: Denies Difficulty climbing stairs: Denies Difficulty getting out of chair: Denies Difficulty using hands for taps, buttons, cutlery, and/or writing: Reports  Review of Systems  Constitutional: Negative for fatigue.  HENT: Negative for mouth sores, trouble swallowing, trouble swallowing, mouth dryness and nose dryness.   Eyes: Negative for itching and dryness.  Respiratory: Negative for shortness of breath, wheezing and difficulty breathing.   Cardiovascular: Negative for chest pain and palpitations.  Gastrointestinal: Negative for blood in stool, constipation and diarrhea.  Endocrine: Negative for increased urination.  Genitourinary: Negative for difficulty urinating and painful urination.  Musculoskeletal: Positive for arthralgias, joint pain, joint swelling and morning stiffness.  Skin: Negative for rash, hair loss and redness.  Allergic/Immunologic: Negative for susceptible to infections.   Neurological: Positive for numbness and headaches. Negative for dizziness, light-headedness, memory loss and weakness.  Hematological: Negative for bruising/bleeding tendency.  Psychiatric/Behavioral: Positive for sleep disturbance. Negative for confusion.    PMFS History:  Patient Active Problem List   Diagnosis Date Noted  . Primary osteoarthritis of both hands 01/02/2019  . DDD (degenerative disc disease), cervical 01/02/2019  . DDD (degenerative disc disease), lumbar 01/02/2019  . Interstitial cystitis 01/02/2019  . History of migraine 01/02/2019  . History of endometriosis 01/02/2019  . History of gastroesophageal reflux (GERD) 01/02/2019  . History of Clostridioides difficile infection 01/02/2019    Past Medical History:  Diagnosis Date  . Anxiety   . Chronic back pain   . Chronic constipation   . Depression   . Eosinophilic esophagitis   . FH: colon cancer   . GERD (gastroesophageal reflux disease)   . Hiatal hernia   . Hx of Clostridium difficile infection   . IBS (irritable bowel syndrome)     Family History  Problem Relation Age of Onset  . Colon cancer Mother        took 3 feet of colon   . Rheumatic fever Mother        age 64-13.5  . Heart attack Maternal Aunt   . Stomach cancer Maternal Aunt   . Liver cancer Maternal Aunt   . Heart attack Maternal Uncle   . Lung cancer Maternal Uncle   . Kidney cancer Maternal Uncle   . Leukemia Paternal Aunt   . Alcoholism Father   . Alcoholism Brother   . Hypertension Brother   .  Esophageal cancer Neg Hx    Past Surgical History:  Procedure Laterality Date  . BACK SURGERY    . CHOLECYSTECTOMY    . COLONOSCOPY  08/17/2016   Internal hemorrhoids.   . ESOPHAGOGASTRODUODENOSCOPY  03/01/2017   Gastritis.   Marland Kitchen PLACEMENT OF BREAST IMPLANTS     Social History   Social History Narrative  . Not on file    There is no immunization history on file for this patient.   Objective: Vital Signs: BP 111/73 (BP Location:  Left Arm, Patient Position: Sitting, Cuff Size: Normal)   Pulse 76   Resp 12   Ht 5' 6.5" (1.689 m)   Wt 130 lb (59 kg)   BMI 20.67 kg/m    Physical Exam Vitals signs and nursing note reviewed.  Constitutional:      Appearance: She is well-developed.  HENT:     Head: Normocephalic and atraumatic.  Eyes:     Conjunctiva/sclera: Conjunctivae normal.  Neck:     Musculoskeletal: Normal range of motion.  Cardiovascular:     Rate and Rhythm: Normal rate and regular rhythm.     Heart sounds: Normal heart sounds.  Pulmonary:     Effort: Pulmonary effort is normal.     Breath sounds: Normal breath sounds.  Abdominal:     General: Bowel sounds are normal.     Palpations: Abdomen is soft.  Lymphadenopathy:     Cervical: No cervical adenopathy.  Skin:    General: Skin is warm and dry.     Capillary Refill: Capillary refill takes less than 2 seconds.  Neurological:     Mental Status: She is alert and oriented to person, place, and time.  Psychiatric:        Behavior: Behavior normal.      Musculoskeletal Exam: She has limited range of motion of her cervical and lumbar spine.  Shoulder joints with good range of motion.  She had tenderness over bilateral lateral epicondyle area.  She has DIP and PIP thickening but no synovitis.  She has no synovitis on palpation of her left wrist.  Hip joints, knee joints, ankles were in good range of motion with no synovitis.  CDAI Exam: CDAI Score: - Patient Global: -; Provider Global: - Swollen: -; Tender: - Joint Exam   No joint exam has been documented for this visit   There is currently no information documented on the homunculus. Go to the Rheumatology activity and complete the homunculus joint exam.  Investigation: No additional findings.  Imaging: Xr Hand 2 View Left  Result Date: 12/09/2018 Mild PIP and DIP narrowing was noted.  No MCP, intercarpal or radiocarpal joint space narrowing was noted.  No erosive changes were noted.  Impression: These findings are consistent mild osteoarthritis of the hand.  Xr Hand 2 View Right  Result Date: 12/09/2018 Minimal PIP and DIP narrowing was noted.  No MCP, intercarpal radiocarpal joint space narrowing was noted.  No erosive changes were noted. Impression: These findings are consistent with mild osteoarthritis of the hand.   Recent Labs: Lab Results  Component Value Date   WBC 3.7 (L) 02/10/2018   HGB 12.5 02/10/2018   PLT 230.0 02/10/2018   NA 141 02/10/2018   K 4.1 02/10/2018   CL 105 02/10/2018   CO2 29 02/10/2018   GLUCOSE 85 02/10/2018   BUN 14 02/10/2018   CREATININE 0.67 02/10/2018   BILITOT 0.2 02/10/2018   ALKPHOS 52 02/10/2018   AST 24 02/10/2018   ALT 29  02/10/2018   PROT 6.3 02/10/2018   ALBUMIN 4.2 02/10/2018   CALCIUM 9.4 02/10/2018  December 09, 2018 uric acid 3.3, anti-CCP negative, 14 3 3  eta negative, ANA negative  Speciality Comments: No specialty comments available.  Procedures:  No procedures performed Allergies: Azithromycin, Contrast media [iodinated diagnostic agents], Latex, Metaxalone, and Shellfish allergy   Assessment / Plan:     Visit Diagnoses: Primary osteoarthritis of both hands - Clinical and radiographic findings are consistent with osteoarthritis.  All autoimmune work-up was negative.  Use of Voltaren gel and natural anti-inflammatories.  Patient states that she is holding supplements due to her cervical spine injection.  She plans to start them after that.  Joint protection muscle strengthening was emphasized.  Lateral epicondylitis, bilateral - A handout on exercises was given at the last visit.  She has not started the exercises yet.  I also discussed the possible option of physical therapy referral or cortisone injections if her symptoms do not improve.  DDD (degenerative disc disease), cervical - MRI from February 01, 2017 showed mild degenerative changes.  She is scheduled to have cervical spine injection for left-sided  radiculopathy.  DDD (degenerative disc disease), lumbar - Status post fusion in 2009 and 2010.  She continues to have chronic pain.  Other medical problems are listed as follows:  Interstitial cystitis  History of migraine  History of endometriosis  History of gastroesophageal reflux (GERD)  History of Clostridioides difficile infection  Orders: No orders of the defined types were placed in this encounter.  No orders of the defined types were placed in this encounter.     Follow-Up Instructions: Return if symptoms worsen or fail to improve, for Osteoarthritis.   Bo Merino, MD  Note - This record has been created using Editor, commissioning.  Chart creation errors have been sought, but may not always  have been located. Such creation errors do not reflect on  the standard of medical care.

## 2018-12-24 DIAGNOSIS — G894 Chronic pain syndrome: Secondary | ICD-10-CM | POA: Diagnosis not present

## 2018-12-24 DIAGNOSIS — Z79899 Other long term (current) drug therapy: Secondary | ICD-10-CM | POA: Diagnosis not present

## 2018-12-24 DIAGNOSIS — M5412 Radiculopathy, cervical region: Secondary | ICD-10-CM | POA: Diagnosis not present

## 2018-12-24 DIAGNOSIS — M961 Postlaminectomy syndrome, not elsewhere classified: Secondary | ICD-10-CM | POA: Diagnosis not present

## 2019-01-02 ENCOUNTER — Other Ambulatory Visit: Payer: Self-pay

## 2019-01-02 ENCOUNTER — Encounter: Payer: Self-pay | Admitting: Rheumatology

## 2019-01-02 ENCOUNTER — Ambulatory Visit: Payer: PPO | Admitting: Rheumatology

## 2019-01-02 VITALS — BP 111/73 | HR 76 | Resp 12 | Ht 66.5 in | Wt 130.0 lb

## 2019-01-02 DIAGNOSIS — K589 Irritable bowel syndrome without diarrhea: Secondary | ICD-10-CM | POA: Insufficient documentation

## 2019-01-02 DIAGNOSIS — Z8742 Personal history of other diseases of the female genital tract: Secondary | ICD-10-CM

## 2019-01-02 DIAGNOSIS — M5030 Other cervical disc degeneration, unspecified cervical region: Secondary | ICD-10-CM

## 2019-01-02 DIAGNOSIS — Z8619 Personal history of other infectious and parasitic diseases: Secondary | ICD-10-CM

## 2019-01-02 DIAGNOSIS — M7712 Lateral epicondylitis, left elbow: Secondary | ICD-10-CM | POA: Diagnosis not present

## 2019-01-02 DIAGNOSIS — M5136 Other intervertebral disc degeneration, lumbar region: Secondary | ICD-10-CM

## 2019-01-02 DIAGNOSIS — M19041 Primary osteoarthritis, right hand: Secondary | ICD-10-CM | POA: Insufficient documentation

## 2019-01-02 DIAGNOSIS — K449 Diaphragmatic hernia without obstruction or gangrene: Secondary | ICD-10-CM | POA: Insufficient documentation

## 2019-01-02 DIAGNOSIS — Z8669 Personal history of other diseases of the nervous system and sense organs: Secondary | ICD-10-CM

## 2019-01-02 DIAGNOSIS — Z8719 Personal history of other diseases of the digestive system: Secondary | ICD-10-CM

## 2019-01-02 DIAGNOSIS — N3010 Interstitial cystitis (chronic) without hematuria: Secondary | ICD-10-CM

## 2019-01-02 DIAGNOSIS — M19042 Primary osteoarthritis, left hand: Secondary | ICD-10-CM

## 2019-01-02 DIAGNOSIS — M51369 Other intervertebral disc degeneration, lumbar region without mention of lumbar back pain or lower extremity pain: Secondary | ICD-10-CM

## 2019-01-02 HISTORY — DX: Other intervertebral disc degeneration, lumbar region without mention of lumbar back pain or lower extremity pain: M51.369

## 2019-01-02 HISTORY — DX: Personal history of other diseases of the digestive system: Z87.19

## 2019-01-02 HISTORY — DX: Interstitial cystitis (chronic) without hematuria: N30.10

## 2019-01-02 HISTORY — DX: Personal history of other diseases of the female genital tract: Z87.42

## 2019-01-02 HISTORY — DX: Personal history of other diseases of the nervous system and sense organs: Z86.69

## 2019-01-02 HISTORY — DX: Primary osteoarthritis, right hand: M19.041

## 2019-01-02 HISTORY — DX: Personal history of other infectious and parasitic diseases: Z86.19

## 2019-01-02 HISTORY — DX: Other intervertebral disc degeneration, lumbar region: M51.36

## 2019-01-02 HISTORY — DX: Other cervical disc degeneration, unspecified cervical region: M50.30

## 2019-01-06 DIAGNOSIS — M961 Postlaminectomy syndrome, not elsewhere classified: Secondary | ICD-10-CM | POA: Diagnosis not present

## 2019-01-06 DIAGNOSIS — M5412 Radiculopathy, cervical region: Secondary | ICD-10-CM | POA: Diagnosis not present

## 2019-01-06 DIAGNOSIS — F329 Major depressive disorder, single episode, unspecified: Secondary | ICD-10-CM | POA: Diagnosis not present

## 2019-01-07 ENCOUNTER — Telehealth: Payer: Self-pay

## 2019-01-07 NOTE — Telephone Encounter (Signed)
Rx was faxed to [facility] for linzess 145 mcg to take daily. She was given 1 refill and was told on paper to schedule a follow up appointment

## 2019-01-09 ENCOUNTER — Ambulatory Visit: Payer: PPO | Admitting: Rheumatology

## 2019-01-26 DIAGNOSIS — G894 Chronic pain syndrome: Secondary | ICD-10-CM | POA: Diagnosis not present

## 2019-01-26 DIAGNOSIS — M5136 Other intervertebral disc degeneration, lumbar region: Secondary | ICD-10-CM | POA: Diagnosis not present

## 2019-01-26 DIAGNOSIS — M5412 Radiculopathy, cervical region: Secondary | ICD-10-CM | POA: Diagnosis not present

## 2019-01-26 DIAGNOSIS — M961 Postlaminectomy syndrome, not elsewhere classified: Secondary | ICD-10-CM | POA: Diagnosis not present

## 2019-02-03 ENCOUNTER — Ambulatory Visit: Payer: PPO | Admitting: Rheumatology

## 2019-02-03 DIAGNOSIS — M961 Postlaminectomy syndrome, not elsewhere classified: Secondary | ICD-10-CM

## 2019-02-03 HISTORY — DX: Postlaminectomy syndrome, not elsewhere classified: M96.1

## 2019-02-16 ENCOUNTER — Telehealth: Payer: Self-pay | Admitting: Gastroenterology

## 2019-02-16 MED ORDER — DEXILANT 60 MG PO CPDR: 1.0000 | DELAYED_RELEASE_CAPSULE | Freq: Every day | ORAL | 0 refills | Status: DC

## 2019-02-16 NOTE — Telephone Encounter (Signed)
Pt Needs rf for dexilant, she uses [facility] in [place].

## 2019-02-16 NOTE — Telephone Encounter (Signed)
Sent prescription to patients pharmacy.  

## 2019-03-17 DIAGNOSIS — M961 Postlaminectomy syndrome, not elsewhere classified: Secondary | ICD-10-CM | POA: Diagnosis not present

## 2019-03-23 DIAGNOSIS — G894 Chronic pain syndrome: Secondary | ICD-10-CM | POA: Diagnosis not present

## 2019-03-23 DIAGNOSIS — M5412 Radiculopathy, cervical region: Secondary | ICD-10-CM | POA: Diagnosis not present

## 2019-03-23 DIAGNOSIS — M961 Postlaminectomy syndrome, not elsewhere classified: Secondary | ICD-10-CM | POA: Diagnosis not present

## 2019-04-04 DIAGNOSIS — Z20828 Contact with and (suspected) exposure to other viral communicable diseases: Secondary | ICD-10-CM | POA: Diagnosis not present

## 2019-04-04 DIAGNOSIS — R519 Headache, unspecified: Secondary | ICD-10-CM | POA: Diagnosis not present

## 2019-04-11 DIAGNOSIS — J1289 Other viral pneumonia: Secondary | ICD-10-CM | POA: Diagnosis not present

## 2019-04-11 DIAGNOSIS — R0602 Shortness of breath: Secondary | ICD-10-CM | POA: Diagnosis not present

## 2019-04-11 DIAGNOSIS — R0789 Other chest pain: Secondary | ICD-10-CM | POA: Diagnosis not present

## 2019-04-11 DIAGNOSIS — U071 COVID-19: Secondary | ICD-10-CM | POA: Diagnosis not present

## 2019-04-14 ENCOUNTER — Telehealth: Payer: Self-pay | Admitting: Gastroenterology

## 2019-04-14 NOTE — Telephone Encounter (Signed)
Would you like to refill this prescription?  

## 2019-04-14 NOTE — Telephone Encounter (Signed)
Pt is scheduled for a virtual visit 05/11/18 and requested a refill for Dexilant.  She stated that she currently has pneumonia from [place].

## 2019-04-14 NOTE — Telephone Encounter (Signed)
Hope she feels better Dexilant 60 mg p.o. once a day, 30, 6 refills Please tell her that all our best wishes and prayers go out for her for speedy recovery  RG

## 2019-04-15 MED ORDER — DEXILANT 60 MG PO CPDR: 1.0000 | DELAYED_RELEASE_CAPSULE | Freq: Every day | ORAL | 6 refills | Status: DC

## 2019-04-15 NOTE — Telephone Encounter (Signed)
Sent prescription to patients pharmacy.  

## 2019-04-17 ENCOUNTER — Encounter (HOSPITAL_COMMUNITY): Payer: Self-pay | Admitting: Emergency Medicine

## 2019-04-17 ENCOUNTER — Emergency Department (HOSPITAL_COMMUNITY): Payer: PPO

## 2019-04-17 ENCOUNTER — Inpatient Hospital Stay (HOSPITAL_COMMUNITY)
Admission: EM | Admit: 2019-04-17 | Discharge: 2019-04-21 | DRG: 177 | Disposition: A | Discharge status: Home / Self Care | Payer: PPO | Attending: Student | Admitting: Student

## 2019-04-17 ENCOUNTER — Other Ambulatory Visit: Payer: Self-pay

## 2019-04-17 DIAGNOSIS — Z8249 Family history of ischemic heart disease and other diseases of the circulatory system: Secondary | ICD-10-CM | POA: Diagnosis not present

## 2019-04-17 DIAGNOSIS — Z79890 Hormone replacement therapy (postmenopausal): Secondary | ICD-10-CM | POA: Diagnosis not present

## 2019-04-17 DIAGNOSIS — K581 Irritable bowel syndrome with constipation: Secondary | ICD-10-CM | POA: Diagnosis present

## 2019-04-17 DIAGNOSIS — Z8051 Family history of malignant neoplasm of kidney: Secondary | ICD-10-CM | POA: Diagnosis not present

## 2019-04-17 DIAGNOSIS — M5136 Other intervertebral disc degeneration, lumbar region: Secondary | ICD-10-CM | POA: Diagnosis present

## 2019-04-17 DIAGNOSIS — Z801 Family history of malignant neoplasm of trachea, bronchus and lung: Secondary | ICD-10-CM | POA: Diagnosis not present

## 2019-04-17 DIAGNOSIS — F4521 Hypochondriasis: Secondary | ICD-10-CM | POA: Diagnosis present

## 2019-04-17 DIAGNOSIS — E876 Hypokalemia: Secondary | ICD-10-CM | POA: Diagnosis not present

## 2019-04-17 DIAGNOSIS — G8929 Other chronic pain: Secondary | ICD-10-CM

## 2019-04-17 DIAGNOSIS — E872 Acidosis: Secondary | ICD-10-CM | POA: Diagnosis not present

## 2019-04-17 DIAGNOSIS — M549 Dorsalgia, unspecified: Secondary | ICD-10-CM

## 2019-04-17 DIAGNOSIS — M545 Low back pain: Secondary | ICD-10-CM | POA: Diagnosis not present

## 2019-04-17 DIAGNOSIS — Z87891 Personal history of nicotine dependence: Secondary | ICD-10-CM | POA: Diagnosis not present

## 2019-04-17 DIAGNOSIS — K219 Gastro-esophageal reflux disease without esophagitis: Secondary | ICD-10-CM | POA: Diagnosis present

## 2019-04-17 DIAGNOSIS — M5030 Other cervical disc degeneration, unspecified cervical region: Secondary | ICD-10-CM | POA: Diagnosis not present

## 2019-04-17 DIAGNOSIS — Z791 Long term (current) use of non-steroidal anti-inflammatories (NSAID): Secondary | ICD-10-CM

## 2019-04-17 DIAGNOSIS — J9601 Acute respiratory failure with hypoxia: Secondary | ICD-10-CM | POA: Diagnosis not present

## 2019-04-17 DIAGNOSIS — Z800 Family history of malignant neoplasm of digestive organs: Secondary | ICD-10-CM

## 2019-04-17 DIAGNOSIS — Z8619 Personal history of other infectious and parasitic diseases: Secondary | ICD-10-CM | POA: Diagnosis not present

## 2019-04-17 DIAGNOSIS — R0602 Shortness of breath: Secondary | ICD-10-CM | POA: Diagnosis not present

## 2019-04-17 DIAGNOSIS — Z79899 Other long term (current) drug therapy: Secondary | ICD-10-CM

## 2019-04-17 DIAGNOSIS — Z9049 Acquired absence of other specified parts of digestive tract: Secondary | ICD-10-CM | POA: Diagnosis not present

## 2019-04-17 DIAGNOSIS — Z9882 Breast implant status: Secondary | ICD-10-CM | POA: Diagnosis not present

## 2019-04-17 DIAGNOSIS — R1011 Right upper quadrant pain: Secondary | ICD-10-CM | POA: Diagnosis not present

## 2019-04-17 DIAGNOSIS — R0789 Other chest pain: Secondary | ICD-10-CM | POA: Diagnosis not present

## 2019-04-17 DIAGNOSIS — M19041 Primary osteoarthritis, right hand: Secondary | ICD-10-CM | POA: Diagnosis not present

## 2019-04-17 DIAGNOSIS — J069 Acute upper respiratory infection, unspecified: Secondary | ICD-10-CM | POA: Diagnosis present

## 2019-04-17 DIAGNOSIS — R748 Abnormal levels of other serum enzymes: Secondary | ICD-10-CM | POA: Diagnosis not present

## 2019-04-17 DIAGNOSIS — R509 Fever, unspecified: Secondary | ICD-10-CM | POA: Diagnosis not present

## 2019-04-17 DIAGNOSIS — Z806 Family history of leukemia: Secondary | ICD-10-CM

## 2019-04-17 DIAGNOSIS — J1282 Pneumonia due to coronavirus disease 2019: Secondary | ICD-10-CM

## 2019-04-17 DIAGNOSIS — Z66 Do not resuscitate: Secondary | ICD-10-CM | POA: Diagnosis present

## 2019-04-17 DIAGNOSIS — Z888 Allergy status to other drugs, medicaments and biological substances status: Secondary | ICD-10-CM

## 2019-04-17 DIAGNOSIS — R000 Tachycardia, unspecified: Secondary | ICD-10-CM | POA: Diagnosis not present

## 2019-04-17 DIAGNOSIS — J1289 Other viral pneumonia: Secondary | ICD-10-CM | POA: Diagnosis present

## 2019-04-17 DIAGNOSIS — R0902 Hypoxemia: Secondary | ICD-10-CM | POA: Diagnosis not present

## 2019-04-17 DIAGNOSIS — Z881 Allergy status to other antibiotic agents status: Secondary | ICD-10-CM

## 2019-04-17 DIAGNOSIS — Z91040 Latex allergy status: Secondary | ICD-10-CM

## 2019-04-17 DIAGNOSIS — M19042 Primary osteoarthritis, left hand: Secondary | ICD-10-CM | POA: Diagnosis present

## 2019-04-17 DIAGNOSIS — U071 COVID-19: Principal | ICD-10-CM | POA: Diagnosis present

## 2019-04-17 DIAGNOSIS — R10811 Right upper quadrant abdominal tenderness: Secondary | ICD-10-CM

## 2019-04-17 DIAGNOSIS — Z79891 Long term (current) use of opiate analgesic: Secondary | ICD-10-CM

## 2019-04-17 HISTORY — DX: COVID-19: U07.1

## 2019-04-17 HISTORY — DX: Acute upper respiratory infection, unspecified: J06.9

## 2019-04-17 LAB — I-STAT BETA HCG BLOOD, ED (MC, WL, AP ONLY): I-stat hCG, quantitative: <5 m[IU]/mL (ref <5)

## 2019-04-17 LAB — COMPREHENSIVE METABOLIC PANEL
ALT: 156 U/L — ABNORMAL HIGH (ref 0–44)
AST: 44 U/L — ABNORMAL HIGH (ref 15–41)
Albumin: 4.3 g/dL (ref 3.5–5.0)
Alkaline Phosphatase: 257 U/L — ABNORMAL HIGH (ref 38–126)
Anion gap: 8 (ref 5–15)
BUN: 16 mg/dL (ref 6–20)
CO2: 24 mmol/L (ref 22–32)
Calcium: 9.7 mg/dL (ref 8.9–10.3)
Chloride: 108 mmol/L (ref 98–111)
Creatinine, Ser: 0.72 mg/dL (ref 0.44–1.00)
GFR calc Af Amer: >60 mL/min (ref >60)
GFR calc non Af Amer: >60 mL/min (ref >60)
Glucose, Bld: 143 mg/dL — ABNORMAL HIGH (ref 70–99)
Potassium: 3.9 mmol/L (ref 3.5–5.1)
Sodium: 140 mmol/L (ref 135–145)
Total Bilirubin: 0.6 mg/dL (ref 0.3–1.2)
Total Protein: 8.1 g/dL (ref 6.5–8.1)

## 2019-04-17 LAB — FIBRINOGEN: Fibrinogen: 714 mg/dL — ABNORMAL HIGH (ref 210–475)

## 2019-04-17 LAB — POC SARS CORONAVIRUS 2 AG: SARS Coronavirus 2 Ag: NEGATIVE

## 2019-04-17 LAB — BASIC METABOLIC PANEL
Anion gap: 9 (ref 5–15)
BUN: 17 mg/dL (ref 6–20)
CO2: 23 mmol/L (ref 22–32)
Calcium: 9.8 mg/dL (ref 8.9–10.3)
Chloride: 109 mmol/L (ref 98–111)
Creatinine, Ser: 0.63 mg/dL (ref 0.44–1.00)
GFR calc Af Amer: >60 mL/min (ref >60)
GFR calc non Af Amer: >60 mL/min (ref >60)
Glucose, Bld: 113 mg/dL — ABNORMAL HIGH (ref 70–99)
Potassium: 4.1 mmol/L (ref 3.5–5.1)
Sodium: 141 mmol/L (ref 135–145)

## 2019-04-17 LAB — CBC WITH DIFFERENTIAL/PLATELET
Abs Immature Granulocytes: 0.01 10*3/uL (ref 0.00–0.07)
Basophils Absolute: 0 10*3/uL (ref 0.0–0.1)
Basophils Relative: 0 %
Eosinophils Absolute: 0.1 10*3/uL (ref 0.0–0.5)
Eosinophils Relative: 2 %
HCT: 41.1 % (ref 36.0–46.0)
Hemoglobin: 13.6 g/dL (ref 12.0–15.0)
Immature Granulocytes: 0 %
Lymphocytes Relative: 20 %
Lymphs Abs: 0.9 10*3/uL (ref 0.7–4.0)
MCH: 31.6 pg (ref 26.0–34.0)
MCHC: 33.1 g/dL (ref 30.0–36.0)
MCV: 95.6 fL (ref 80.0–100.0)
Monocytes Absolute: 0.4 10*3/uL (ref 0.1–1.0)
Monocytes Relative: 9 %
Neutro Abs: 3.1 10*3/uL (ref 1.7–7.7)
Neutrophils Relative %: 69 %
Platelets: 352 10*3/uL (ref 150–400)
RBC: 4.3 MIL/uL (ref 3.87–5.11)
RDW: 12 % (ref 11.5–15.5)
WBC: 4.6 10*3/uL (ref 4.0–10.5)
nRBC: 0 % (ref 0.0–0.2)

## 2019-04-17 LAB — PROCALCITONIN: Procalcitonin: <0.1 ng/mL

## 2019-04-17 LAB — ABO/RH: ABO/RH(D): A POS

## 2019-04-17 LAB — TROPONIN I (HIGH SENSITIVITY)
Troponin I (High Sensitivity): 4 ng/L (ref <18)
Troponin I (High Sensitivity): 4 ng/L (ref <18)

## 2019-04-17 LAB — LACTATE DEHYDROGENASE: LDH: 137 U/L (ref 98–192)

## 2019-04-17 LAB — LACTIC ACID, PLASMA: Lactic Acid, Venous: 0.8 mmol/L (ref 0.5–1.9)

## 2019-04-17 LAB — D-DIMER, QUANTITATIVE: D-Dimer, Quant: 1.4 ug/mL-FEU — ABNORMAL HIGH (ref 0.00–0.50)

## 2019-04-17 LAB — TRIGLYCERIDES: Triglycerides: 69 mg/dL (ref <150)

## 2019-04-17 MED ORDER — SODIUM CHLORIDE 0.9 % IV SOLN
200.0000 mg | Freq: Once | INTRAVENOUS | Status: AC
  Administered 2019-04-17: 200 mg via INTRAVENOUS
  Filled 2019-04-17: qty 200

## 2019-04-17 MED ORDER — METHOCARBAMOL 500 MG PO TABS
750.0000 mg | ORAL_TABLET | Freq: Three times a day (TID) | ORAL | Status: DC
  Administered 2019-04-18 – 2019-04-21 (×11): 750 mg via ORAL
  Filled 2019-04-17 (×11): qty 2

## 2019-04-17 MED ORDER — MELATONIN 5 MG PO TABS
30.0000 mg | ORAL_TABLET | Freq: Every day | ORAL | Status: DC
  Administered 2019-04-18 – 2019-04-20 (×4): 30 mg via ORAL
  Filled 2019-04-17 (×5): qty 6

## 2019-04-17 MED ORDER — ZINC SULFATE 220 (50 ZN) MG PO CAPS
220.0000 mg | ORAL_CAPSULE | Freq: Every day | ORAL | Status: DC
  Administered 2019-04-18 – 2019-04-21 (×5): 220 mg via ORAL
  Filled 2019-04-17 (×5): qty 1

## 2019-04-17 MED ORDER — HYDROCOD POLST-CPM POLST ER 10-8 MG/5ML PO SUER
5.0000 mL | Freq: Two times a day (BID) | ORAL | Status: DC | PRN
  Administered 2019-04-19 – 2019-04-21 (×3): 5 mL via ORAL
  Filled 2019-04-17 (×4): qty 5

## 2019-04-17 MED ORDER — MORPHINE SULFATE ER 30 MG PO TBCR
30.0000 mg | EXTENDED_RELEASE_TABLET | Freq: Three times a day (TID) | ORAL | Status: DC
  Administered 2019-04-18 – 2019-04-21 (×11): 30 mg via ORAL
  Filled 2019-04-17 (×11): qty 1

## 2019-04-17 MED ORDER — GUAIFENESIN-DM 100-10 MG/5ML PO SYRP
10.0000 mL | ORAL_SOLUTION | ORAL | Status: DC | PRN
  Administered 2019-04-20 – 2019-04-21 (×5): 10 mL via ORAL
  Filled 2019-04-17 (×5): qty 10

## 2019-04-17 MED ORDER — ENOXAPARIN SODIUM 40 MG/0.4ML ~~LOC~~ SOLN
40.0000 mg | SUBCUTANEOUS | Status: DC
  Administered 2019-04-18 – 2019-04-20 (×4): 40 mg via SUBCUTANEOUS
  Filled 2019-04-17 (×4): qty 0.4

## 2019-04-17 MED ORDER — DEXAMETHASONE SODIUM PHOSPHATE 10 MG/ML IJ SOLN
6.0000 mg | INTRAMUSCULAR | Status: DC
  Administered 2019-04-18 – 2019-04-21 (×4): 6 mg via INTRAVENOUS
  Filled 2019-04-17 (×4): qty 1

## 2019-04-17 MED ORDER — ONDANSETRON HCL 4 MG PO TABS: 4.0000 mg | ORAL_TABLET | Freq: Four times a day (QID) | ORAL | Status: DC | PRN

## 2019-04-17 MED ORDER — HYDROCODONE-ACETAMINOPHEN 10-325 MG PO TABS
1.0000 | ORAL_TABLET | ORAL | Status: DC | PRN
  Administered 2019-04-18 – 2019-04-20 (×5): 1 via ORAL
  Filled 2019-04-17 (×6): qty 1

## 2019-04-17 MED ORDER — SODIUM CHLORIDE 0.9 % IV BOLUS
1000.0000 mL | Freq: Once | INTRAVENOUS | Status: AC
  Administered 2019-04-17: 1000 mL via INTRAVENOUS

## 2019-04-17 MED ORDER — IOHEXOL 350 MG/ML SOLN
100.0000 mL | Freq: Once | INTRAVENOUS | Status: AC | PRN
  Administered 2019-04-17: 100 mL via INTRAVENOUS

## 2019-04-17 MED ORDER — IPRATROPIUM-ALBUTEROL 20-100 MCG/ACT IN AERS
1.0000 | INHALATION_SPRAY | Freq: Four times a day (QID) | RESPIRATORY_TRACT | Status: DC
  Administered 2019-04-18 – 2019-04-21 (×13): 1 via RESPIRATORY_TRACT
  Filled 2019-04-17: qty 4

## 2019-04-17 MED ORDER — DEXAMETHASONE SODIUM PHOSPHATE 10 MG/ML IJ SOLN
10.0000 mg | Freq: Once | INTRAMUSCULAR | Status: AC
  Administered 2019-04-17: 10 mg via INTRAVENOUS
  Filled 2019-04-17: qty 1

## 2019-04-17 MED ORDER — SODIUM CHLORIDE 0.9 % IV SOLN
100.0000 mg | Freq: Every day | INTRAVENOUS | Status: AC
  Administered 2019-04-18 – 2019-04-21 (×4): 100 mg via INTRAVENOUS
  Filled 2019-04-17 (×4): qty 100

## 2019-04-17 MED ORDER — FAMOTIDINE 20 MG PO TABS
40.0000 mg | ORAL_TABLET | Freq: Every day | ORAL | Status: DC
  Administered 2019-04-18 – 2019-04-21 (×5): 40 mg via ORAL
  Filled 2019-04-17 (×5): qty 2

## 2019-04-17 MED ORDER — VITAMIN C 500 MG PO TABS
500.0000 mg | ORAL_TABLET | Freq: Every day | ORAL | Status: DC
  Administered 2019-04-18 – 2019-04-21 (×5): 500 mg via ORAL
  Filled 2019-04-17 (×5): qty 1

## 2019-04-17 MED ORDER — ONDANSETRON HCL 4 MG/2ML IJ SOLN
4.0000 mg | Freq: Four times a day (QID) | INTRAMUSCULAR | Status: DC | PRN
  Filled 2019-04-17: qty 2

## 2019-04-17 NOTE — ED Triage Notes (Signed)
Patient reports she was Covid+ x2 weeks ago. States continued SOB on exertion, intermittent fever, and N/V/D. Denies chest pain.

## 2019-04-17 NOTE — H&P (Signed)
History and Physical    Lisa [name] [id number] DOB: 1970/10/07 DOA: 04/17/2019  PCP: Mateo Flow, MD  Patient coming from: Home  I have personally briefly reviewed patient's old medical records in [place]  Chief Complaint: Shortness of breath  HPI: Lisa [name] is a 48 y.o. female with medical history significant for GERD and chronic back pain who presents to the ED for evaluation of progressive shortness of breath.  Patient reports receiving a positive COVID-19 test in urgent care on 04/04/2019 (documentation under media tab).  At that time she was having GI symptoms with nausea, vomiting, and diarrhea which have since resolved.  Since then she has been having progressive shortness of breath worsening with exertion and dry cough.  She reports having chills and diaphoresis but no subjective fevers.  She has felt fatigued.  She noticed her oxygen dropping down to 80% on room air at home using her home O2 monitor.  She presented to the ED for further evaluation and management.  She denies any chest pain, dysuria, or edema.  ED Course:  Vitals showed BP 123/61, pulse 88, RR 15, temp 98.2 Fahrenheit, SPO2 98% on room air.  Per ED documentation, patient's O2 saturation dropped to 82% with ambulation on room air.  Labs are notable for WBC 4.6, hemoglobin 13.6, platelets 352,000, sodium 141, potassium 4.1, bicarb 23, BUN 17, creatinine 0.63, serum glucose 113, high-sensitivity troponin I 4x2.  2 view chest x-ray showed ill-defined airspace opacities in the right lung base.  CTA chest PE study was negative for PE, patchy peripheral airspace disease bilaterally was seen.  COVID-19 inflammatory markers were ordered and pending.  The hospitalist service was consulted today for further evaluation and management.  Review of Systems: All systems reviewed and are negative except as documented in history of present illness above.   Past Medical History:  Diagnosis Date  .  Anxiety   . Chronic back pain   . Chronic constipation   . Depression   . Eosinophilic esophagitis   . FH: colon cancer   . GERD (gastroesophageal reflux disease)   . Hiatal hernia   . Hx of Clostridium difficile infection   . IBS (irritable bowel syndrome)     Past Surgical History:  Procedure Laterality Date  . BACK SURGERY    . CHOLECYSTECTOMY    . COLONOSCOPY  08/17/2016   Internal hemorrhoids.   . ESOPHAGOGASTRODUODENOSCOPY  03/01/2017   Gastritis.   Marland Kitchen PLACEMENT OF BREAST IMPLANTS      Social History:  reports that she has quit smoking. She has never used smokeless tobacco. She reports that she does not drink alcohol or use drugs.  Allergies  Allergen Reactions  . Azithromycin Other (See Comments)    Unknown   . Latex Other (See Comments)    Unknown   . Metaxalone Itching  . Shellfish Allergy     Family History  Problem Relation Age of Onset  . Colon cancer Mother        took 3 feet of colon   . Rheumatic fever Mother        age 27-13.5  . Heart attack Maternal Aunt   . Stomach cancer Maternal Aunt   . Liver cancer Maternal Aunt   . Heart attack Maternal Uncle   . Lung cancer Maternal Uncle   . Kidney cancer Maternal Uncle   . Leukemia Paternal Aunt   . Alcoholism Father   . Alcoholism Brother   . Hypertension Brother   .  Esophageal cancer Neg Hx      Prior to Admission medications   Medication Sig Start Date End Date Taking? Authorizing Provider  diclofenac (VOLTAREN) 75 MG EC tablet TAKE 1 TABLET(75 MG) BY MOUTH TWICE DAILY Patient taking differently: Take 75 mg by mouth 2 (two) times daily as needed for mild pain.  10/21/17  Yes Stover, Titorya, DPM  famotidine (PEPCID) 40 MG tablet Take 1 tablet (40 mg total) by mouth daily. 07/04/18  Yes [name], MD  FLOVENT HFA 110 MCG/ACT inhaler Inhale 2 puffs into the lungs daily as needed (shortness of breath).  03/05/17  Yes [provider]  HYDROcodone-acetaminophen (NORCO) 10-325 MG tablet  Take 1 tablet by mouth every 4 (four) hours as needed.  11/16/16  Yes [provider]  Melatonin 10 MG CAPS Take 30 mg by mouth at bedtime.    Yes [provider]  methocarbamol (ROBAXIN) 750 MG tablet Take 750 mg by mouth 3 (three) times daily.  04/10/16  Yes [provider]  morphine (MS CONTIN) 30 MG 12 hr tablet Take 30 mg by mouth 3 (three) times daily.  11/16/16  Yes [provider]  pentosan polysulfate (ELMIRON) 100 MG capsule Take 100 mg by mouth as needed (IC flareup).  06/22/16  Yes [provider]  polyethylene glycol (MIRALAX / GLYCOLAX) packet Take 17 g by mouth as needed.    Yes [provider]  zonisamide (ZONEGRAN) 25 MG capsule Take 25 mg by mouth as needed.  12/06/14  Yes [provider]  dexlansoprazole (DEXILANT) 60 MG capsule Take 1 capsule (60 mg total) by mouth daily. 04/15/19   [name], MD  hyoscyamine (LEVSIN SL) 0.125 MG SL tablet Take 1-2 tabs SL as needed every 4-6 hours for abdominal pain Patient not taking: Reported on 12/09/2018 02/17/18   [name], MD  linaclotide [facility]) 145 MCG CAPS capsule Take 1 capsule (145 mcg total) by mouth daily before breakfast. Patient not taking: Reported on 04/17/2019 05/29/18   [name], MD  ondansetron (ZOFRAN-ODT) 4 MG disintegrating tablet Take 4 mg by mouth every 6 (six) hours as needed for nausea/vomiting. 04/12/19   [provider]  ranitidine (ZANTAC) 150 MG tablet TK 1 T PO QHS Patient not taking: Reported on 12/09/2018 09/09/17   [name], MD    Physical Exam: Vitals:   04/17/19 1830 04/17/19 1900 04/17/19 1930 04/17/19 2000  BP:    97/77  Pulse: (!) 106 80 83 88  Resp: 18 (!) 21 14 16   Temp:      TempSrc:      SpO2: 100% 100% 100% 99%    Constitutional: Sitting up in chair, NAD, calm, comfortable Eyes: PERRL, lids and conjunctivae normal ENMT: Mucous membranes are moist. Posterior pharynx clear of any exudate or lesions.Normal  dentition.  Neck: normal, supple, no masses. Respiratory: clear to auscultation bilaterally, no wheezing, no crackles. Normal respiratory effort. No accessory muscle use.  Occasional coughing. Cardiovascular: Regular rate and rhythm, no murmurs / rubs / gallops. No extremity edema. 2+ pedal pulses. Abdomen: Mild epigastric tenderness, no masses palpated. No hepatosplenomegaly. Bowel sounds positive.  Musculoskeletal: no clubbing / cyanosis. No joint deformity upper and lower extremities. Good ROM, no contractures. Normal muscle tone.  Skin: no rashes, lesions, ulcers. No induration Neurologic: CN 2-12 grossly intact. Sensation intact, Strength 5/5 in all 4.  Psychiatric: Normal judgment and insight. Alert and oriented x 3. Normal mood.   Labs on Admission: I have personally reviewed following labs  and imaging studies  CBC: Recent Labs  Lab 04/17/19 1509  WBC 4.6  NEUTROABS 3.1  HGB 13.6  HCT 41.1  MCV 95.6  PLT [id number]   Basic Metabolic Panel: Recent Labs  Lab 04/17/19 1509  NA 141  K 4.1  CL 109  CO2 23  GLUCOSE 113*  BUN 17  CREATININE 0.63  CALCIUM 9.8   GFR: CrCl cannot be calculated (Unknown ideal weight.). Liver Function Tests: No results for input(s): AST, ALT, ALKPHOS, BILITOT, PROT, ALBUMIN in the last 168 hours. No results for input(s): LIPASE, AMYLASE in the last 168 hours. No results for input(s): AMMONIA in the last 168 hours. Coagulation Profile: No results for input(s): INR, PROTIME in the last 168 hours. Cardiac Enzymes: No results for input(s): CKTOTAL, CKMB, CKMBINDEX, TROPONINI in the last 168 hours. BNP (last 3 results) No results for input(s): PROBNP in the last 8760 hours. HbA1C: No results for input(s): HGBA1C in the last 72 hours. CBG: No results for input(s): GLUCAP in the last 168 hours. Lipid Profile: No results for input(s): CHOL, HDL, LDLCALC, TRIG, CHOLHDL, LDLDIRECT in the last 72 hours. Thyroid Function Tests: No results for input(s):  TSH, T4TOTAL, FREET4, T3FREE, THYROIDAB in the last 72 hours. Anemia Panel: No results for input(s): VITAMINB12, FOLATE, FERRITIN, TIBC, IRON, RETICCTPCT in the last 72 hours. Urine analysis: No results found for: COLORURINE, APPEARANCEUR, LABSPEC, [place], GLUCOSEU, HGBUR, BILIRUBINUR, KETONESUR, PROTEINUR, UROBILINOGEN, NITRITE, LEUKOCYTESUR  Radiological Exams on Admission: DG Chest 2 View  Result Date: 04/17/2019 CLINICAL DATA:  Shortness of breath. Additional history provided: Patient COVID positive 2 weeks ago, continued shortness of breath on exertion, intermittent fever, nausea, vomiting, diarrhea. EXAM: CHEST - 2 VIEW COMPARISON:  Chest radiograph 04/11/2019 FINDINGS: Heart size within normal limits. Ill-defined airspace opacities within the right lung base. Subtle airspace opacities also questioned within the left lung base. No pleural effusion or pneumothorax. No acute bony abnormality. Surgical clips within the upper abdomen. IMPRESSION: Ill-defined airspace opacities within the right lung base. Subtle airspace opacities also questioned within the left lung base. Findings are consistent with pneumonia given provided history. Radiographic follow-up to resolution recommended. Electronically Signed   By: Kellie Simmering DO   On: 04/17/2019 14:00   CT Angio Chest PE W/Cm &/Or Wo Cm  Result Date: 04/17/2019 CLINICAL DATA:  Tachycardia and hypoxia. COVID-19 positive EXAM: CT ANGIOGRAPHY CHEST WITH CONTRAST TECHNIQUE: Multidetector CT imaging of the chest was performed using the standard protocol during bolus administration of intravenous contrast. Multiplanar CT image reconstructions and MIPs were obtained to evaluate the vascular anatomy. CONTRAST:  175mL OMNIPAQUE IOHEXOL 350 MG/ML SOLN COMPARISON:  Chest two view 04/17/2019 FINDINGS: Cardiovascular: Negative for pulmonary embolism. Heart size normal. Thoracic aorta normal. Negative for pericardial effusion Mediastinum/Nodes: Negative for mass or  adenopathy Lungs/Pleura: Patchy peripheral airspace disease in the right upper lobe and right lower lobe and in the left lower lobe. Infiltrates most consistent with viral pneumonia given history of COVID-19. No pleural effusion. Negative for collapse. Upper Abdomen: Negative Musculoskeletal: Negative Review of the MIP images confirms the above findings. IMPRESSION: 1. Negative for pulmonary embolism. 2. Patchy peripheral airspace disease bilaterally most consistent with viral pneumonia, given COVID-19 history. Electronically Signed   By: Franchot Gallo M.D.   On: 04/17/2019 17:27    EKG: Independently reviewed. Sinus tachycardia, T wave inversion inferolateral leads.  No prior for comparison.  Assessment/Plan Principal Problem:   Acute respiratory disease due to COVID-19 virus Active Problems:   GERD (gastroesophageal reflux  disease)   Chronic back pain  Lisa [name] is a 48 y.o. female with medical history significant for GERD and chronic back pain who is admitted with acute respiratory disease due to COVID-19 viral infection with pneumonitis:  Acute respiratory disease due to COVID-19 viral infection with pneumonitis: SARS-CoV-2 positive on 04/04/2019 with progressive persistent shortness of breath and patchy airspace disease seen on chest x-ray and CTA chest.  She becomes hypoxic to 82% on room air with ambulation. -Start IV remdesivir per pharmacy protocol -Continue IV dexamethasone 6 mg daily -Supplemental O2 as needed -Continue incentive spirometer, flutter valve, Combivent -Continue antitussives, vitamin C, zinc -Follow-up baseline inflammatory markers and daily labs  GERD: Continue Pepcid.  Chronic back pain: Continue home MS Contin, as needed Norco, and Robaxin.  DVT prophylaxis: Lovenox Code Status: Partial code/DNI, confirmed with patient Family Communication: Discussed with patient, she has discussed with her family Disposition Plan: Pending clinical progress Consults  called: None Admission status: Inpatient for management of acute respiratory disease due to COVID-19 viral infection with pneumonitis and hypoxia.   Zada Finders MD [facility]  If 7PM-7AM, please contact night-coverage [email or web address]  04/17/2019, 8:39 PM

## 2019-04-17 NOTE — ED Provider Notes (Signed)
[facility] DEPT Provider Note   CSN: [id number] Arrival date & time: 04/17/19  1303     History Chief Complaint  Patient presents with  . Covid+  . Shortness of Breath    Lisa [name] is a 49 y.o. female with past medical history significant for anxiety, chronic back pain and constipation, IBS presents to emergency department today with chief complaint of shortness of breath. Patient tested positive for Covid 04/05/19 at an urgent care.  Is day 12 of illness.  She states she is been short of breath since diagnosis however has progressively worsened over the last 3 days.  She is reporting dyspnea on exertion stating she can only walk several feet before stopping to catch her breath.  She has a portable pulse ox at home and has been using that while ambulating.  She states while ambulating her SPO2 is in the low 80s.  It takes several minutes to get above 90.  She is also reporting intermittent fevers.  T-max was 3 days ago at 101.2.  She has been taking Tylenol with transient symptom relief.  She is also endorsing nausea, nonbloody nonbilious emesis and diarrhea.  She denies any blood in stool.  She is admitting to decreased p.o. intake because she feels so poorly.  She is not anticoagulated.  She denies any chills, congestion, chest pain, wheezing, abdominal pain, urinary symptoms, rash, neck pain. History provided by patient with additional history obtained from chart review.     Past Medical History:  Diagnosis Date  . Anxiety   . Chronic back pain   . Chronic constipation   . Depression   . Eosinophilic esophagitis   . FH: colon cancer   . GERD (gastroesophageal reflux disease)   . Hiatal hernia   . Hx of Clostridium difficile infection   . IBS (irritable bowel syndrome)     Patient Active Problem List   Diagnosis Date Noted  . Primary osteoarthritis of both hands 01/02/2019  . DDD (degenerative disc disease), cervical 01/02/2019  . DDD  (degenerative disc disease), lumbar 01/02/2019  . Interstitial cystitis 01/02/2019  . History of migraine 01/02/2019  . History of endometriosis 01/02/2019  . History of gastroesophageal reflux (GERD) 01/02/2019  . History of Clostridioides difficile infection 01/02/2019    Past Surgical History:  Procedure Laterality Date  . BACK SURGERY    . CHOLECYSTECTOMY    . COLONOSCOPY  08/17/2016   Internal hemorrhoids.   . ESOPHAGOGASTRODUODENOSCOPY  03/01/2017   Gastritis.   Marland Kitchen PLACEMENT OF BREAST IMPLANTS       OB History   No obstetric history on file.     Family History  Problem Relation Age of Onset  . Colon cancer Mother        took 3 feet of colon   . Rheumatic fever Mother        age 3-13.5  . Heart attack Maternal Aunt   . Stomach cancer Maternal Aunt   . Liver cancer Maternal Aunt   . Heart attack Maternal Uncle   . Lung cancer Maternal Uncle   . Kidney cancer Maternal Uncle   . Leukemia Paternal Aunt   . Alcoholism Father   . Alcoholism Brother   . Hypertension Brother   . Esophageal cancer Neg Hx     Social History   Tobacco Use  . Smoking status: Former Research scientist (life sciences)  . Smokeless tobacco: Never Used  . Tobacco comment: quit 15 years ago like 2004  Substance Use  Topics  . Alcohol use: Never  . Drug use: Never    Home Medications Prior to Admission medications   Medication Sig Start Date End Date Taking? Authorizing Provider  dexlansoprazole (DEXILANT) 60 MG capsule Take 1 capsule (60 mg total) by mouth daily. 04/15/19   [name], MD  diclofenac (VOLTAREN) 75 MG EC tablet TAKE 1 TABLET(75 MG) BY MOUTH TWICE DAILY 10/21/17   Landis Martins, DPM  Estradiol-Estriol-Progesterone (BIEST/PROGESTERONE TD) APPLY ONE CLICK(0.25ML) TOPICALLY ONCE DAILY ROTATING SITES-INNER WRIST, INNER/OUTER THIGH, AND BACK OF KNEE 03/01/17   [provider]  famotidine (PEPCID) 40 MG tablet Take 1 tablet (40 mg total) by mouth daily. 07/04/18   [name], MD  FLOVENT  HFA 110 MCG/ACT inhaler as needed.  03/05/17   [provider]  HYDROcodone-acetaminophen (NORCO) 10-325 MG tablet Take 1 tablet by mouth every 4 (four) hours as needed.  11/16/16   [provider]  hyoscyamine (LEVSIN SL) 0.125 MG SL tablet Take 1-2 tabs SL as needed every 4-6 hours for abdominal pain Patient not taking: Reported on 12/09/2018 02/17/18   [name], MD  linaclotide [facility]) 145 MCG CAPS capsule Take 1 capsule (145 mcg total) by mouth daily before breakfast. 05/29/18   [name], MD  Melatonin 10 MG CAPS Take by mouth.    [provider]  methocarbamol (ROBAXIN) 750 MG tablet 2 (two) times daily.  04/10/16   [provider]  morphine (MS CONTIN) 30 MG 12 hr tablet 3 (three) times daily.  11/16/16   [provider]  pentosan polysulfate (ELMIRON) 100 MG capsule as needed.  06/22/16   [provider]  polyethylene glycol (MIRALAX / GLYCOLAX) packet Take 17 g by mouth as needed.     [provider]  Progesterone Micronized (PROGESTERONE PO) TAKE ONE CAPSULE BY MOUTH 30-45 MINUTES BEFORE BEDTIME 03/01/17   [provider]  ranitidine (ZANTAC) 150 MG tablet TK 1 T PO QHS Patient not taking: Reported on 12/09/2018 09/09/17   [name], MD  solifenacin (VESICARE) 10 MG tablet as needed.  06/14/16   [provider]  zonisamide (ZONEGRAN) 25 MG capsule as needed.  12/06/14   [provider]    Allergies    Azithromycin, Contrast media [iodinated diagnostic agents], Latex, Metaxalone, and Shellfish allergy  Review of Systems   Review of Systems All other systems are reviewed and are negative for acute change except as noted in the HPI.  Physical Exam Updated Vital Signs BP 123/61   Pulse (!) 118   Temp 98.2 F (36.8 C) (Oral)   Resp 15   SpO2 98%   Physical Exam Vitals and nursing note reviewed.  Constitutional:      General: She is not in acute distress.    Appearance: She is not  ill-appearing.  HENT:     Head: Normocephalic and atraumatic.     Right Ear: Tympanic membrane and external ear normal.     Left Ear: Tympanic membrane and external ear normal.     Nose: Nose normal.     Mouth/Throat:     Mouth: Mucous membranes are dry.     Pharynx: Oropharynx is clear.  Eyes:     General: No scleral icterus.       Right eye: No discharge.        Left eye: No discharge.     Extraocular Movements: Extraocular movements intact.     Conjunctiva/sclera: Conjunctivae normal.     Pupils: Pupils are equal, round,  and reactive to light.  Neck:     Vascular: No JVD.  Cardiovascular:     Rate and Rhythm: Regular rhythm. Tachycardia present.     Pulses: Normal pulses.          Radial pulses are 2+ on the right side and 2+ on the left side.     Heart sounds: Normal heart sounds.  Pulmonary:     Comments: Lung sounds diminished throughout.  Symmetric chest rise. No wheezing, rales, or rhonchi.  She is speaking in full sentences.  No accessory muscle use.  SPO2 is 97% on room air. Abdominal:     Tenderness: There is no right CVA tenderness or left CVA tenderness.     Comments: Abdomen is soft, non-distended, and non-tender in all quadrants. No rigidity, no guarding. No peritoneal signs.  Musculoskeletal:        General: Normal range of motion.     Cervical back: Normal range of motion.     Comments: Homans sign absent bilaterally, no lower extremity edema, no palpable cords, compartments are soft  Skin:    General: Skin is warm and dry.     Capillary Refill: Capillary refill takes less than 2 seconds.     Findings: No rash.  Neurological:     Mental Status: She is oriented to person, place, and time.     GCS: GCS eye subscore is 4. GCS verbal subscore is 5. GCS motor subscore is 6.     Comments: Fluent speech, no facial droop.  Psychiatric:        Behavior: Behavior normal.     ED Results / Procedures / Treatments   Labs (all labs ordered are listed, but only  abnormal results are displayed) Labs Reviewed  BASIC METABOLIC PANEL - Abnormal; Notable for the following components:      Result Value   Glucose, Bld 113 (*)    All other components within normal limits  CBC WITH DIFFERENTIAL/PLATELET  TROPONIN I (HIGH SENSITIVITY)  TROPONIN I (HIGH SENSITIVITY)    EKG EKG Interpretation  Date/Time:  Friday April 17 2019 13:14:27 EST Ventricular Rate:  102 PR Interval:    QRS Duration: 86 QT Interval:  296 QTC Calculation: 386 R Axis:   92 Text Interpretation: Sinus tachycardia Right atrial enlargement Borderline right axis deviation Abnormal lateral Q waves Repol abnrm suggests ischemia, diffuse leads Rate faster Nonspecific ST and T wave abnormality Confirmed by Ezequiel Essex [phone]) on 04/17/2019 2:15:42 PM   Radiology DG Chest 2 View  Result Date: 04/17/2019 CLINICAL DATA:  Shortness of breath. Additional history provided: Patient COVID positive 2 weeks ago, continued shortness of breath on exertion, intermittent fever, nausea, vomiting, diarrhea. EXAM: CHEST - 2 VIEW COMPARISON:  Chest radiograph 04/11/2019 FINDINGS: Heart size within normal limits. Ill-defined airspace opacities within the right lung base. Subtle airspace opacities also questioned within the left lung base. No pleural effusion or pneumothorax. No acute bony abnormality. Surgical clips within the upper abdomen. IMPRESSION: Ill-defined airspace opacities within the right lung base. Subtle airspace opacities also questioned within the left lung base. Findings are consistent with pneumonia given provided history. Radiographic follow-up to resolution recommended. Electronically Signed   By: Kellie Simmering DO   On: 04/17/2019 14:00    Procedures Procedures (including critical care time)  Medications Ordered in ED Medications  sodium chloride 0.9 % bolus 1,000 mL (has no administration in time range)  dexamethasone (DECADRON) injection 10 mg (has no administration in time  range)  ED Course  I have reviewed the triage vital signs and the nursing notes.  Pertinent labs & imaging results that were available during my care of the patient were reviewed by me and considered in my medical decision making (see chart for details).    MDM Rules/Calculators/A&P     CHA2DS2/VAS Stroke Risk Points      N/A >= 2 Points: High Risk  1 - 1.99 Points: Medium Risk  0 Points: Low Risk    A final score could not be computed because of missing components.: Last  Change: N/A   This score determines the patient's risk of having a stroke if the  patient has atrial fibrillation. This score is not applicable to this patient. Components are not  calculated.   Patient seen and examined.  She is afebrile on arrival.  She is tachycardic to 118, no hypoxia.  She looks to not feel well however is nontoxic in appearance.  On my exam mucous membranes are dry.  Lungs sounds are diminished throughout however she does not appear to be in respiratory distress.  She is speaking in full sentences.  No abdominal tenderness on exam.  No CVA tenderness. EKG viewed by myself and ED attending shows sinus tachycardia, abnormal lateral Q waves.  Given her presentation and Covid diagnosis we will proceed with labs including CBC, BMP, delta troponin, chest x-ray and CTA chest to rule out PE.  Patient has document allergy to contrast dye.  When asked about this she says she is allergic to shellfish.  She has had CT scans within the last year with contrast dye without allergic reaction. Chest xray viewed by me shows ill-defined airspace opacities in right lung base and questionable opacities in left lung base.  Patient has been seen and evaluated by ED attending Dr. Wyvonnia Dusky.  He agrees with plan of care.  Patient care transferred to B. Morelli PA-C at the end of my shift pending CTA chest, second troponin, ambulation trial.  Patient presentation, ED course, and plan of care discussed with review of all  pertinent labs and imaging. Please see his note for further details regarding further ED course and disposition.  Work-up was unremarkable patient likely discharge home with symptomatic care.  I do question the accuracy of her pulse ox.   Lisa [name] was evaluated in Emergency Department on 04/17/2019 for the symptoms described in the history of present illness. She was evaluated in the context of the global COVID-19 pandemic, which necessitated consideration that the patient might be at risk for infection with the SARS-CoV-2 virus that causes COVID-19. Institutional protocols and algorithms that pertain to the evaluation of patients at risk for COVID-19 are in a state of rapid change based on information released by regulatory bodies including the CDC and federal and state organizations. These policies and algorithms were followed during the patient's care in the ED.   Portions of this note were generated with [employer]. Dictation errors may occur despite best attempts at proofreading.   Final Clinical Impression(s) / ED Diagnoses Final diagnoses:  None    Rx / DC Orders ED Discharge Orders    None       Flint Melter 04/17/19 1622    Ezequiel Essex, MD 04/17/19 1658

## 2019-04-17 NOTE — ED Provider Notes (Signed)
Care handoff received from [facility] PA-C at shift change, please see her note for full details.  In short patient tested positive for COVID-19 virus on 04/04/2019 at an urgent care.  She has complaint of shortness of breath with reported hypoxia on home monitor down to 80% on room air.  EKG, initial troponin troponin, CBC and BMP reassuring.  Chest x-ray shows pneumonia.  CT angiogram PE study was ordered for evaluation of PE.  Patient has received IV fluids and Decadron.  Plan of care at shift change is to await CT angio PE study, delta troponin and disposition. Physical Exam  BP 123/61   Pulse (!) 118   Temp 98.2 F (36.8 C) (Oral)   Resp 15   SpO2 98%   Physical Exam Constitutional:      General: She is not in acute distress.    Appearance: Normal appearance. She is well-developed. She is not ill-appearing or diaphoretic.  HENT:     Head: Normocephalic and atraumatic.     Right Ear: External ear normal.     Left Ear: External ear normal.     Nose: Nose normal.  Eyes:     General: Vision grossly intact. Gaze aligned appropriately.     Pupils: Pupils are equal, round, and reactive to light.  Neck:     Trachea: Trachea and phonation normal. No tracheal deviation.  Pulmonary:     Effort: Pulmonary effort is normal. No respiratory distress.  Abdominal:     General: There is no distension.     Palpations: Abdomen is soft.     Tenderness: There is no abdominal tenderness. There is no guarding or rebound.  Musculoskeletal:        General: Normal range of motion.     Cervical back: Normal range of motion.  Skin:    General: Skin is warm and dry.  Neurological:     Mental Status: She is alert.     GCS: GCS eye subscore is 4. GCS verbal subscore is 5. GCS motor subscore is 6.     Comments: Speech is clear and goal oriented, follows commands Major Cranial nerves without deficit, no facial droop Moves extremities without ataxia, coordination intact  Psychiatric:      Behavior: Behavior normal.     ED Course/Procedures   Clinical Course as of Apr 16 1912  Fri Apr 17, 2019  1912 Dr. Posey Pronto   [BM]    Clinical Course User Index [BM] Deliah Boston, PA-C    .Critical Care Performed by: Deliah Boston, PA-C Authorized by: Deliah Boston, PA-C   Critical care provider statement:    Critical care time (minutes):  35   Critical care was necessary to treat or prevent imminent or life-threatening deterioration of the following conditions:  Respiratory failure (Hypoxic respiratory failure in the setting of COVID-19 viral infection necessitating supplemental oxygen)   Critical care was time spent personally by me on the following activities:  Discussions with consultants, evaluation of patient's response to treatment, examination of patient, ordering and performing treatments and interventions, ordering and review of laboratory studies, ordering and review of radiographic studies, pulse oximetry, re-evaluation of patient's condition, obtaining history from patient or surrogate, review of old charts and development of treatment plan with patient or surrogate    MDM      CBC within normal limits BMP nonacute Initial high-sensitivity troponin: 4 Delta high-sensitivity troponin: 4 Chest x-ray:  IMPRESSION:  Ill-defined airspace opacities within the right lung base. Subtle  airspace opacities also questioned within the left lung base.  Findings are consistent with pneumonia given provided history.  Radiographic follow-up to resolution recommended.   CT angio PE study:  IMPRESSION:  1. Negative for pulmonary embolism.  2. Patchy peripheral airspace disease bilaterally most consistent  with viral pneumonia, given COVID-19 history.   EKG:   Sinus tachycardia Right atrial enlargement Borderline right axis deviation Abnormal lateral Q waves Repol abnrm suggests ischemia, diffuse leads Rate faster Nonspecific ST and T wave abnormality Confirmed  by Ezequiel Essex [phone]) on 04/17/2019 2:15:42 PM - Patient saturation would fluctuate between low 90s and 100% during our conversation.  She is ambulated on pulse ox by nursing staff and desaturated down to 82% on room air, additionally mildly tachycardic at 104, endorsed shortness of breath and dizziness with ambulation.  Case discussed with Dr. Kathrynn Humble, plan of care at this time is to admit to hospitalist service. - Patient reassessed resting comfortably no acute distress.  She states understanding of care plan and is agreeable for admission.  Placed on 2 L submental oxygen nasal cannula by nursing staff. - Discussed case with Dr. Posey Pronto from hospitalist service who is seeing patient for admission.  Lisa [name] was evaluated in Emergency Department on 04/17/2019 for the symptoms described in the history of present illness. She was evaluated in the context of the global COVID-19 pandemic, which necessitated consideration that the patient might be at risk for infection with the SARS-CoV-2 virus that causes COVID-19. Institutional protocols and algorithms that pertain to the evaluation of patients at risk for COVID-19 are in a state of rapid change based on information released by regulatory bodies including the CDC and federal and state organizations. These policies and algorithms were followed during the patient's care in the ED.  Note: Portions of this report may have been transcribed using voice recognition software. Every effort was made to ensure accuracy; however, inadvertent computerized transcription errors may still be present.        Gari Crown 04/17/19 Dougherty, Ankit, MD 04/17/19 2013

## 2019-04-17 NOTE — Progress Notes (Signed)
ED TO INPATIENT HANDOFF REPORT  Name/Age/Gender Lisa [name] 48 y.o. female  Code Status    Code Status Orders  (From admission, onward)         Start     Ordered   04/17/19 2027  Limited resuscitation (code)  Continuous    Question Answer Comment  In the event of cardiac or respiratory ARREST: Initiate Code Blue, Call Rapid Response Yes   In the event of cardiac or respiratory ARREST: Perform CPR Yes   In the event of cardiac or respiratory ARREST: Perform Intubation/Mechanical Ventilation No   In the event of cardiac or respiratory ARREST: Use NIPPV/BiPAp only if indicated Yes   In the event of cardiac or respiratory ARREST: Administer ACLS medications if indicated Yes   In the event of cardiac or respiratory ARREST: Perform Defibrillation or Cardioversion if indicated Yes      04/17/19 2034        Code Status History    This patient has a current code status but no historical code status.   Advance Care Planning Activity    Advance Directive Documentation     Most Recent Value  Type of Advance Directive  Healthcare Power of Attorney, Living will  Pre-existing out of facility DNR order (yellow form or pink MOST form)  --  "MOST" Form in Place?  --      Home/SNF/Other Home  Chief Complaint Acute respiratory disease due to COVID-19 virus [U07.1, J06.9]  Level of Care/Admitting Diagnosis ED Disposition    ED Disposition Condition [place]: [place] [[id number]]  Level of Care: Med-Surg [16]  Covid Evaluation: Confirmed COVID Positive  Diagnosis: Acute respiratory disease due to COVID-19 virus [id number]  Admitting Physician: Lenore Cordia [id number]  Attending Physician: Lenore Cordia [id number]  Estimated length of stay: past midnight tomorrow  Certification:: I certify this patient will need inpatient services for at least 2 midnights       Medical History Past Medical History:  Diagnosis Date  . Anxiety    . Chronic back pain   . Chronic constipation   . Depression   . Eosinophilic esophagitis   . FH: colon cancer   . GERD (gastroesophageal reflux disease)   . Hiatal hernia   . Hx of Clostridium difficile infection   . IBS (irritable bowel syndrome)     Allergies Allergies  Allergen Reactions  . Azithromycin Other (See Comments)    Unknown   . Latex Other (See Comments)    Unknown   . Metaxalone Itching  . Shellfish Allergy     IV Location/Drains/Wounds Patient Lines/Drains/Airways Status   Active Line/Drains/Airways    Name:   Placement date:   Placement time:   Site:   Days:   Peripheral IV 04/17/19 Left Antecubital   04/17/19    1652    Antecubital   less than 1          Labs/Imaging Results for orders placed or performed during the hospital encounter of 04/17/19 (from the past 48 hour(s))  CBC with Differential     Status: None   Collection Time: 04/17/19  3:09 PM  Result Value Ref Range   WBC 4.6 4.0 - 10.5 K/uL   RBC 4.30 3.87 - 5.11 MIL/uL   Hemoglobin 13.6 12.0 - 15.0 g/dL   HCT 41.1 36.0 - 46.0 %   MCV 95.6 80.0 - 100.0 fL   MCH 31.6 26.0 - 34.0 pg  MCHC 33.1 30.0 - 36.0 g/dL   RDW 12.0 11.5 - 15.5 %   Platelets 352 150 - 400 K/uL   nRBC 0.0 0.0 - 0.2 %   Neutrophils Relative % 69 %   Neutro Abs 3.1 1.7 - 7.7 K/uL   Lymphocytes Relative 20 %   Lymphs Abs 0.9 0.7 - 4.0 K/uL   Monocytes Relative 9 %   Monocytes Absolute 0.4 0.1 - 1.0 K/uL   Eosinophils Relative 2 %   Eosinophils Absolute 0.1 0.0 - 0.5 K/uL   Basophils Relative 0 %   Basophils Absolute 0.0 0.0 - 0.1 K/uL   Immature Granulocytes 0 %   Abs Immature Granulocytes 0.01 0.00 - 0.07 K/uL    Comment: Performed at [facility], [place] [address] [place], [place] [id number]  Basic metabolic panel     Status: Abnormal   Collection Time: 04/17/19  3:09 PM  Result Value Ref Range   Sodium 141 135 - 145 mmol/L   Potassium 4.1 3.5 - 5.1 mmol/L   Chloride 109 98 - 111 mmol/L    CO2 23 22 - 32 mmol/L   Glucose, Bld 113 (H) 70 - 99 mg/dL   BUN 17 6 - 20 mg/dL   Creatinine, Ser 0.63 0.44 - 1.00 mg/dL   Calcium 9.8 8.9 - 10.3 mg/dL   GFR calc non Af Amer >60 >60 mL/min   GFR calc Af Amer >60 >60 mL/min   Anion gap 9 5 - 15    Comment: Performed at [facility], [place] [address], [place], [place] [address]  Troponin I (High Sensitivity)     Status: None   Collection Time: 04/17/19  3:09 PM  Result Value Ref Range   Troponin I (High Sensitivity) 4 <18 ng/L    Comment: (NOTE) Elevated high sensitivity troponin I (hsTnI) values and significant  changes across serial measurements may suggest ACS but many other  chronic and acute conditions are known to elevate hsTnI results.  Refer to the "Links" section for chest pain algorithms and additional  guidance. Performed at [facility], [place] [address] [place], [place] [address]   Troponin I (High Sensitivity)     Status: None   Collection Time: 04/17/19  4:52 PM  Result Value Ref Range   Troponin I (High Sensitivity) 4 <18 ng/L    Comment: (NOTE) Elevated high sensitivity troponin I (hsTnI) values and significant  changes across serial measurements may suggest ACS but many other  chronic and acute conditions are known to elevate hsTnI results.  Refer to the "Links" section for chest pain algorithms and additional  guidance. Performed at [facility], [place] [address], [place], [place] [address]   Lactic acid, plasma     Status: None   Collection Time: 04/17/19  8:57 PM  Result Value Ref Range   Lactic Acid, Venous 0.8 0.5 - 1.9 mmol/L    Comment: Performed at [facility], [place] [address], [place], [place] [address]  Comprehensive metabolic panel     Status: Abnormal   Collection Time: 04/17/19  8:57 PM  Result Value Ref Range   Sodium 140 135 - 145 mmol/L   Potassium 3.9 3.5 - 5.1 mmol/L   Chloride 108 98 - 111 mmol/L   CO2 24 22 -  32 mmol/L   Glucose, Bld 143 (H) 70 - 99 mg/dL   BUN 16 6 - 20 mg/dL   Creatinine, Ser 0.72 0.44 - 1.00  mg/dL   Calcium 9.7 8.9 - 10.3 mg/dL   Total Protein 8.1 6.5 - 8.1 g/dL   Albumin 4.3 3.5 - 5.0 g/dL   AST 44 (H) 15 - 41 U/L   ALT 156 (H) 0 - 44 U/L   Alkaline Phosphatase 257 (H) 38 - 126 U/L   Total Bilirubin 0.6 0.3 - 1.2 mg/dL   GFR calc non Af Amer >60 >60 mL/min   GFR calc Af Amer >60 >60 mL/min   Anion gap 8 5 - 15    Comment: Performed at [facility], [place] [address] [place], [place] [address]  Lactate dehydrogenase     Status: None   Collection Time: 04/17/19  8:57 PM  Result Value Ref Range   LDH 137 98 - 192 U/L    Comment: Performed at [facility], [place] [address], [place], [place] [address]  Triglycerides     Status: None   Collection Time: 04/17/19  8:57 PM  Result Value Ref Range   Triglycerides 69 <150 mg/dL    Comment: Performed at [facility], [place] [address] [place] [address], [place],  [address]   DG Chest 2 View  Result Date: 04/17/2019 CLINICAL DATA:  Shortness of breath. Additional history provided: Patient COVID positive 2 weeks ago, continued shortness of breath on exertion, intermittent fever, nausea, vomiting, diarrhea. EXAM: CHEST - 2 VIEW COMPARISON:  Chest radiograph 04/11/2019 FINDINGS: Heart size within normal limits. Ill-defined airspace opacities within the right lung base. Subtle airspace opacities also questioned within the left lung base. No pleural effusion or pneumothorax. No acute bony abnormality. Surgical clips within the upper abdomen. IMPRESSION: Ill-defined airspace opacities within the right lung base. Subtle airspace opacities also questioned within the left lung base. Findings are consistent with pneumonia given provided history. Radiographic follow-up to resolution recommended. Electronically Signed   By: Kellie Simmering DO   On: 04/17/2019 14:00   CT Angio Chest PE W/Cm &/Or Wo  Cm  Result Date: 04/17/2019 CLINICAL DATA:  Tachycardia and hypoxia. COVID-19 positive EXAM: CT ANGIOGRAPHY CHEST WITH CONTRAST TECHNIQUE: Multidetector CT imaging of the chest was performed using the standard protocol during bolus administration of intravenous contrast. Multiplanar CT image reconstructions and MIPs were obtained to evaluate the vascular anatomy. CONTRAST:  143mL OMNIPAQUE IOHEXOL 350 MG/ML SOLN COMPARISON:  Chest two view 04/17/2019 FINDINGS: Cardiovascular: Negative for pulmonary embolism. Heart size normal. Thoracic aorta normal. Negative for pericardial effusion Mediastinum/Nodes: Negative for mass or adenopathy Lungs/Pleura: Patchy peripheral airspace disease in the right upper lobe and right lower lobe and in the left lower lobe. Infiltrates most consistent with viral pneumonia given history of COVID-19. No pleural effusion. Negative for collapse. Upper Abdomen: Negative Musculoskeletal: Negative Review of the MIP images confirms the above findings. IMPRESSION: 1. Negative for pulmonary embolism. 2. Patchy peripheral airspace disease bilaterally most consistent with viral pneumonia, given COVID-19 history. Electronically Signed   By: Franchot Gallo M.D.   On: 04/17/2019 17:27    Pending Labs Unresulted Labs (From admission, onward)    Start     Ordered   04/18/19 0500  CBC with Differential/Platelet  Daily,   R     04/17/19 2034   04/18/19 0500  Comprehensive metabolic panel  Daily,   R     04/17/19 2034   04/18/19 0500  C-reactive protein  Daily,   R     04/17/19 2034   04/18/19 0500  D-dimer, quantitative (not at [facility])  Daily,   R  04/17/19 2034   04/18/19 0500  Ferritin  Daily,   R     04/17/19 2034   04/18/19 0500  Magnesium  Daily,   R     04/17/19 2034   04/18/19 0500  Phosphorus  Daily,   R     04/17/19 2034   04/17/19 2207  SARS CORONAVIRUS 2 (TAT 6-24 HRS) Nasopharyngeal Nasopharyngeal Swab  (Tier 3 (TAT 6-24 hrs))  Once,   STAT    Question Answer Comment   Is this test for diagnosis or screening Diagnosis of ill patient   Symptomatic for COVID-19 as defined by CDC Yes   Date of Symptom Onset 04/03/2019   Hospitalized for COVID-19 Yes   Admitted to ICU for COVID-19 No   Previously tested for COVID-19 Yes   Resident in a congregate (group) care setting No   Employed in healthcare setting No   Pregnant No      04/17/19 2207   04/17/19 2034  HIV Antibody (routine testing w rflx)  (HIV Antibody (Routine testing w reflex) panel)  Add-on,   AD     04/17/19 2034   04/17/19 2026  ABO/Rh  Once,   STAT     04/17/19 2034   04/17/19 1845  Lactic acid, plasma  Now then every 2 hours,   STAT     04/17/19 1844   04/17/19 1845  Blood Culture (routine x 2)  BLOOD CULTURE X 2,   STAT     04/17/19 1844   04/17/19 1845  D-dimer, quantitative  ONCE - STAT,   STAT    Comments: Used for prognosis and bed placement. Do not order CT or V/Q.    04/17/19 1844   04/17/19 1845  Procalcitonin  ONCE - STAT,   STAT     04/17/19 1844   04/17/19 1845  Ferritin  Once,   STAT     04/17/19 1844   04/17/19 1845  Fibrinogen  Once,   STAT     04/17/19 1844   04/17/19 1845  C-reactive protein  Once,   STAT     04/17/19 1844          Vitals/Pain Today's Vitals   04/17/19 1900 04/17/19 1930 04/17/19 2000 04/17/19 2130  BP:   97/77 105/72  Pulse: 80 83 88 74  Resp: (!) 21 14 16 16   Temp:      TempSrc:      SpO2: 100% 100% 99% 100%  PainSc:        Isolation Precautions Airborne and Contact precautions  Medications Medications  enoxaparin (LOVENOX) injection 40 mg (has no administration in time range)  remdesivir 200 mg in sodium chloride 0.9% 250 mL IVPB (0 mg Intravenous Stopped 04/17/19 2221)    Followed by  remdesivir 100 mg in sodium chloride 0.9 % 100 mL IVPB (has no administration in time range)  Ipratropium-Albuterol (COMBIVENT) respimat 1 puff (has no administration in time range)  dexamethasone (DECADRON) injection 6 mg (has no administration in  time range)  guaiFENesin-dextromethorphan (ROBITUSSIN DM) 100-10 MG/5ML syrup 10 mL (has no administration in time range)  chlorpheniramine-HYDROcodone (TUSSIONEX) 10-8 MG/5ML suspension 5 mL (has no administration in time range)  vitamin C (ASCORBIC ACID) tablet 500 mg (has no administration in time range)  zinc sulfate capsule 220 mg (has no administration in time range)  ondansetron (ZOFRAN) tablet 4 mg (has no administration in time range)    Or  ondansetron (ZOFRAN) injection 4 mg (has no administration in time range)  famotidine (PEPCID) tablet 40 mg (has no administration in time range)  morphine (MS CONTIN) 12 hr tablet 30 mg (has no administration in time range)  methocarbamol (ROBAXIN) tablet 750 mg (has no administration in time range)  Melatonin CAPS 30 mg (has no administration in time range)  HYDROcodone-acetaminophen (NORCO) 10-325 MG per tablet 1 tablet (has no administration in time range)  sodium chloride 0.9 % bolus 1,000 mL (0 mLs Intravenous Stopped 04/17/19 1842)  dexamethasone (DECADRON) injection 10 mg (10 mg Intravenous Given 04/17/19 1654)  iohexol (OMNIPAQUE) 350 MG/ML injection 100 mL (100 mLs Intravenous Contrast Given 04/17/19 1704)    Mobility walks

## 2019-04-17 NOTE — ED Notes (Signed)
Patient ambulated about 29ft. She reported feeling dizzy and O2 sats dropped as low as 82%. Heart rate rose to 104 at its highest during ambulation.

## 2019-04-18 ENCOUNTER — Encounter (HOSPITAL_COMMUNITY): Payer: Self-pay | Admitting: Internal Medicine

## 2019-04-18 LAB — CBC WITH DIFFERENTIAL/PLATELET
Abs Immature Granulocytes: 0.01 10*3/uL (ref 0.00–0.07)
Basophils Absolute: 0 10*3/uL (ref 0.0–0.1)
Basophils Relative: 0 %
Eosinophils Absolute: 0 10*3/uL (ref 0.0–0.5)
Eosinophils Relative: 0 %
HCT: 37.1 % (ref 36.0–46.0)
Hemoglobin: 12.3 g/dL (ref 12.0–15.0)
Immature Granulocytes: 0 %
Lymphocytes Relative: 12 %
Lymphs Abs: 0.4 10*3/uL — ABNORMAL LOW (ref 0.7–4.0)
MCH: 31.4 pg (ref 26.0–34.0)
MCHC: 33.2 g/dL (ref 30.0–36.0)
MCV: 94.6 fL (ref 80.0–100.0)
Monocytes Absolute: 0.1 10*3/uL (ref 0.1–1.0)
Monocytes Relative: 4 %
Neutro Abs: 2.7 10*3/uL (ref 1.7–7.7)
Neutrophils Relative %: 84 %
Platelets: 322 10*3/uL (ref 150–400)
RBC: 3.92 MIL/uL (ref 3.87–5.11)
RDW: 11.9 % (ref 11.5–15.5)
WBC: 3.2 10*3/uL — ABNORMAL LOW (ref 4.0–10.5)
nRBC: 0 % (ref 0.0–0.2)

## 2019-04-18 LAB — COMPREHENSIVE METABOLIC PANEL
ALT: 125 U/L — ABNORMAL HIGH (ref 0–44)
AST: 36 U/L (ref 15–41)
Albumin: 3.8 g/dL (ref 3.5–5.0)
Alkaline Phosphatase: 226 U/L — ABNORMAL HIGH (ref 38–126)
Anion gap: 12 (ref 5–15)
BUN: 16 mg/dL (ref 6–20)
CO2: 18 mmol/L — ABNORMAL LOW (ref 22–32)
Calcium: 9.5 mg/dL (ref 8.9–10.3)
Chloride: 110 mmol/L (ref 98–111)
Creatinine, Ser: 0.45 mg/dL (ref 0.44–1.00)
GFR calc Af Amer: >60 mL/min (ref >60)
GFR calc non Af Amer: >60 mL/min (ref >60)
Glucose, Bld: 191 mg/dL — ABNORMAL HIGH (ref 70–99)
Potassium: 3.8 mmol/L (ref 3.5–5.1)
Sodium: 140 mmol/L (ref 135–145)
Total Bilirubin: 0.9 mg/dL (ref 0.3–1.2)
Total Protein: 7 g/dL (ref 6.5–8.1)

## 2019-04-18 LAB — MAGNESIUM: Magnesium: 2.2 mg/dL (ref 1.7–2.4)

## 2019-04-18 LAB — SARS CORONAVIRUS 2 (TAT 6-24 HRS): SARS Coronavirus 2: POSITIVE — AB

## 2019-04-18 LAB — HEPATITIS PANEL, ACUTE
HCV Ab: NONREACTIVE
Hep A IgM: NONREACTIVE
Hep B C IgM: NONREACTIVE
Hepatitis B Surface Ag: NONREACTIVE

## 2019-04-18 LAB — C-REACTIVE PROTEIN
CRP: <0.5 mg/dL (ref <1.0)
CRP: 0.6 mg/dL (ref <1.0)

## 2019-04-18 LAB — FERRITIN
Ferritin: 187 ng/mL (ref 11–307)
Ferritin: 159 ng/mL (ref 11–307)

## 2019-04-18 LAB — GAMMA GT: GGT: 273 U/L — ABNORMAL HIGH (ref 7–50)

## 2019-04-18 LAB — HIV ANTIBODY (ROUTINE TESTING W REFLEX): HIV Screen 4th Generation wRfx: NONREACTIVE

## 2019-04-18 LAB — PHOSPHORUS: Phosphorus: 2.5 mg/dL (ref 2.5–4.6)

## 2019-04-18 LAB — D-DIMER, QUANTITATIVE: D-Dimer, Quant: 0.28 ug/mL-FEU (ref 0.00–0.50)

## 2019-04-18 MED ORDER — SENNOSIDES-DOCUSATE SODIUM 8.6-50 MG PO TABS
1.0000 | ORAL_TABLET | Freq: Two times a day (BID) | ORAL | Status: DC | PRN
  Administered 2019-04-20: 1 via ORAL
  Filled 2019-04-18: qty 1

## 2019-04-18 MED ORDER — POLYETHYLENE GLYCOL 3350 17 G PO PACK
17.0000 g | PACK | Freq: Every day | ORAL | Status: DC | PRN
  Administered 2019-04-18: 17 g via ORAL
  Filled 2019-04-18: qty 1

## 2019-04-18 NOTE — Progress Notes (Signed)
Patient c/o some pain on the Left Lateral Dorsum.  According to the patient she dropped frozen chicken on her foot a month ago.  There appears to be a very faint old bruise? Less than 0.5 cm.

## 2019-04-18 NOTE — Progress Notes (Signed)
PROGRESS NOTE  Lisa [name] HRC:[id number] DOB: 1970/10/14   PCP: Mateo Flow, MD  Patient is from: Home.  Active at baseline.  DOA: 04/17/2019 LOS: 1  Brief Narrative / Interim history: 48 year old female with history of DDD, chronic back pain, migraine and GERD who tested positive for COVID-19 on 04/04/2019 presenting with worsening S OB, dry cough, diaphoresis and hypoxemia.  Reportedly desaturated to 80% on room air at home that prompted her to come to ED.  In ED, hemodynamically stable.  Saturation 98% on RA.  Reportedly desaturated to 82% with ambulation on RA.  CBC and BMP without significant finding.  High-sensitivity troponin negative x2.  CXR with ill-defined airspace opacity in RLL.  CTA chest negative for PE but with patchy peripheral airspace disease bilaterally.   Subjective: No major events overnight of this morning.  Reports chest tightness and dry cough.  She says she had brief sharp periumbilical abdominal pain last night about 3 AM that has resolved.  She thinks she might have been constipated.  Denies nausea, vomiting or UTI symptoms.  Objective: Vitals:   04/17/19 2242 04/17/19 2256 04/18/19 0541 04/18/19 1302  BP: (!) 119/49  (!) 94/55 (!) 103/46  Pulse: 68  65 75  Resp: _0 Temp: 98.4 F (36.9 C)  98.5 F (36.9 C) 98.1 F (36.7 C)  TempSrc: Oral  Oral Oral  SpO2: 100%  97% 100%  Weight:  55.8 kg    Height:  _1  (1.676 m)      Intake/Output Summary (Last 24 hours) at 04/18/2019 1339 Last data filed at 04/18/2019 1304 Gross per 24 hour  Intake 2349.14 ml  Output -  Net 2349.14 ml   Filed Weights   04/17/19 2256  Weight: 55.8 kg    Examination:  GENERAL: No acute distress.  Appears well.  HEENT: MMM.  Vision and hearing grossly intact.  NECK: Supple.  No apparent JVD.  RESP:  No IWOB. Good air movement bilaterally. CVS:  RRR. Heart sounds normal.  ABD/GI/GU: Bowel sounds present. Soft. Non tender.  MSK/EXT:  No apparent deformity  or edema. Moves extremities. SKIN: no apparent skin lesion or wound NEURO: Awake, alert and oriented appropriately.  No gross deficit.  PSYCH: Calm. Normal affect.  Procedures:  None  Assessment & Plan: Acute respiratory failure with hypoxia due to COVID-19 pneumonia -Reportedly desaturated to 80% at home, and 82% with ambulation on RA in the ED -Still with chest tightness, cough and DOE.  -Inflammatory markers improving. Recent Labs    04/17/19 2057 04/18/19 0323  DDIMER 1.40* 0.28  FERRITIN 187 159  LDH 137  --   CRP <0.5 0.6  -Continue remdesivir and Decadron-12/11>> -Incentive spirometry, flutter valve, inhalers, antitussive and vitamins -OOB and daily ambulation  Non-anion gap metabolic acidosis: IV fluid? -Continue monitoring  Elevated ALT/elevated alk phos and GGT: Due to COVID-19?  Had nausea, vomiting and diarrhea previously.  No GI symptoms now.  Abdominal exam benign.  Improving. -Continue trending -Check acute hepatitis panel.  DDD/chronic back pain: Stable -Continue home MS Contin, as needed Norco and Robaxin  Constipation -As needed Senokot and MiraLAX  GERD -Continue Pepcid               DVT prophylaxis: Subcu Lovenox Code Status: Full code Family Communication: Patient and/or RN. Available if any question. Disposition Plan: Remains inpatient Consultants: None   Microbiology summarized: COVID-19 screen positive. HIV screen negative.  Sch Meds:  Scheduled Meds: . dexamethasone (DECADRON)  injection  6 mg Intravenous Q24H  . enoxaparin (LOVENOX) injection  40 mg Subcutaneous Q24H  . famotidine  40 mg Oral Daily  . Ipratropium-Albuterol  1 puff Inhalation Q6H  . Melatonin  30 mg Oral QHS  . methocarbamol  750 mg Oral TID  . morphine  30 mg Oral TID  . vitamin C  500 mg Oral Daily  . zinc sulfate  220 mg Oral Daily   Continuous Infusions: . remdesivir 100 mg in NS 100 mL 100 mg (04/18/19 1101)   PRN  Meds:.chlorpheniramine-HYDROcodone, guaiFENesin-dextromethorphan, HYDROcodone-acetaminophen, ondansetron **OR** ondansetron (ZOFRAN) IV, polyethylene glycol, senna-docusate  Antimicrobials: Anti-infectives (From admission, onward)   Start     Dose/Rate Route Frequency Ordered Stop   04/18/19 1000  remdesivir 100 mg in sodium chloride 0.9 % 100 mL IVPB     100 mg 200 mL/hr over 30 Minutes Intravenous Daily 04/17/19 2034 04/22/19 0959   04/17/19 2045  remdesivir 200 mg in sodium chloride 0.9% 250 mL IVPB     200 mg 580 mL/hr over 30 Minutes Intravenous Once 04/17/19 2034 04/17/19 2221       I have personally reviewed the following labs and images: CBC: Recent Labs  Lab 04/17/19 1509 04/18/19 0323  WBC 4.6 3.2*  NEUTROABS 3.1 2.7  HGB 13.6 12.3  HCT 41.1 37.1  MCV 95.6 94.6  PLT 352 322   BMP &GFR Recent Labs  Lab 04/17/19 1509 04/17/19 2057 04/18/19 0323  NA 141 140 140  K 4.1 3.9 3.8  CL 109 108 110  CO2 23 24 18*  GLUCOSE 113* 143* 191*  BUN _0 CREATININE 0.63 0.72 0.45  CALCIUM 9.8 9.7 9.5  MG  --   --  2.2  PHOS  --   --  2.5   Estimated Creatinine Clearance: 74.9 mL/min (by C-G formula based on SCr of 0.45 mg/dL). Liver & Pancreas: Recent Labs  Lab 04/17/19 2057 04/18/19 0323  AST 44* 36  ALT 156* 125*  ALKPHOS 257* 226*  BILITOT 0.6 0.9  PROT 8.1 7.0  ALBUMIN 4.3 3.8   No results for input(s): LIPASE, AMYLASE in the last 168 hours. No results for input(s): AMMONIA in the last 168 hours. Diabetic: No results for input(s): HGBA1C in the last 72 hours. No results for input(s): GLUCAP in the last 168 hours. Cardiac Enzymes: No results for input(s): CKTOTAL, CKMB, CKMBINDEX, TROPONINI in the last 168 hours. No results for input(s): PROBNP in the last 8760 hours. Coagulation Profile: No results for input(s): INR, PROTIME in the last 168 hours. Thyroid Function Tests: No results for input(s): TSH, T4TOTAL, FREET4, T3FREE, THYROIDAB in the last  72 hours. Lipid Profile: Recent Labs    04/17/19 2057  TRIG 69   Anemia Panel: Recent Labs    04/17/19 2057 04/18/19 0323  FERRITIN 187 159   Urine analysis: No results found for: COLORURINE, APPEARANCEUR, LABSPEC, PHURINE, GLUCOSEU, HGBUR, BILIRUBINUR, KETONESUR, PROTEINUR, UROBILINOGEN, NITRITE, LEUKOCYTESUR Sepsis Labs: Invalid input(s): PROCALCITONIN, [place]  Microbiology: Recent Results (from the past 240 hour(s))  SARS CORONAVIRUS 2 (TAT 6-24 HRS) Nasopharyngeal Nasopharyngeal Swab     Status: Abnormal   Collection Time: 04/17/19 10:07 PM   Specimen: Nasopharyngeal Swab  Result Value Ref Range Status   SARS Coronavirus 2 POSITIVE (A) NEGATIVE Final    Comment: RESULT CALLED TO, READ BACK BY AND VERIFIED WITH: ARDAE LEA RN._1  ON 12.12.2020 BY TCALDWELL MT. (NOTE) SARS-CoV-2 target nucleic acids are DETECTED. The SARS-CoV-2 RNA is generally detectable  in upper and lower respiratory specimens during the acute phase of infection. Positive results are indicative of the presence of SARS-CoV-2 RNA. Clinical correlation with patient history and other diagnostic information is  necessary to determine patient infection status. Positive results do not rule out bacterial infection or co-infection with other viruses.  The expected result is Negative. Fact Sheet for Patients: SugarRoll.be Fact Sheet for Healthcare Providers: https://www.woods-mathews.com/ This test is not yet approved or cleared by the [place] FDA and  has been authorized for detection and/or diagnosis of SARS-CoV-2 by FDA under an Emergency Use Authorization (EUA). This EUA will remain  in effect (meaning this test can be  used) for the duration of the COVID-19 declaration under Section 564(b)(1) of the Act, 21 U.S.C. section 360bbb-3(b)(1), unless the authorization is terminated or revoked sooner. Performed at [facility], [place] [address],  [place], [place] [address]     Radiology Studies: DG Chest 2 View  Result Date: 04/17/2019 CLINICAL DATA:  Shortness of breath. Additional history provided: Patient COVID positive 2 weeks ago, continued shortness of breath on exertion, intermittent fever, nausea, vomiting, diarrhea. EXAM: CHEST - 2 VIEW COMPARISON:  Chest radiograph 04/11/2019 FINDINGS: Heart size within normal limits. Ill-defined airspace opacities within the right lung base. Subtle airspace opacities also questioned within the left lung base. No pleural effusion or pneumothorax. No acute bony abnormality. Surgical clips within the upper abdomen. IMPRESSION: Ill-defined airspace opacities within the right lung base. Subtle airspace opacities also questioned within the left lung base. Findings are consistent with pneumonia given provided history. Radiographic follow-up to resolution recommended. Electronically Signed   By: Kellie Simmering DO   On: 04/17/2019 14:00   CT Angio Chest PE W/Cm &/Or Wo Cm  Result Date: 04/17/2019 CLINICAL DATA:  Tachycardia and hypoxia. COVID-19 positive EXAM: CT ANGIOGRAPHY CHEST WITH CONTRAST TECHNIQUE: Multidetector CT imaging of the chest was performed using the standard protocol during bolus administration of intravenous contrast. Multiplanar CT image reconstructions and MIPs were obtained to evaluate the vascular anatomy. CONTRAST:  152m OMNIPAQUE IOHEXOL 350 MG/ML SOLN COMPARISON:  Chest two view 04/17/2019 FINDINGS: Cardiovascular: Negative for pulmonary embolism. Heart size normal. Thoracic aorta normal. Negative for pericardial effusion Mediastinum/Nodes: Negative for mass or adenopathy Lungs/Pleura: Patchy peripheral airspace disease in the right upper lobe and right lower lobe and in the left lower lobe. Infiltrates most consistent with viral pneumonia given history of COVID-19. No pleural effusion. Negative for collapse. Upper Abdomen: Negative Musculoskeletal: Negative Review of the MIP images confirms  the above findings. IMPRESSION: 1. Negative for pulmonary embolism. 2. Patchy peripheral airspace disease bilaterally most consistent with viral pneumonia, given COVID-19 history. Electronically Signed   By: CFranchot GalloM.D.   On: 04/17/2019 17:27     Jahmani Staup T. [place] If 7PM-7AM, please contact night-coverage [email or web address] Password [email or web address] 04/18/2019, 1:39 PM

## 2019-04-19 ENCOUNTER — Inpatient Hospital Stay (HOSPITAL_COMMUNITY): Payer: PPO

## 2019-04-19 LAB — COMPREHENSIVE METABOLIC PANEL
ALT: 108 U/L — ABNORMAL HIGH (ref 0–44)
AST: 57 U/L — ABNORMAL HIGH (ref 15–41)
Albumin: 3.3 g/dL — ABNORMAL LOW (ref 3.5–5.0)
Alkaline Phosphatase: 228 U/L — ABNORMAL HIGH (ref 38–126)
Anion gap: 8 (ref 5–15)
BUN: 18 mg/dL (ref 6–20)
CO2: 24 mmol/L (ref 22–32)
Calcium: 9.4 mg/dL (ref 8.9–10.3)
Chloride: 110 mmol/L (ref 98–111)
Creatinine, Ser: 0.58 mg/dL (ref 0.44–1.00)
GFR calc Af Amer: >60 mL/min (ref >60)
GFR calc non Af Amer: >60 mL/min (ref >60)
Glucose, Bld: 120 mg/dL — ABNORMAL HIGH (ref 70–99)
Potassium: 3.5 mmol/L (ref 3.5–5.1)
Sodium: 142 mmol/L (ref 135–145)
Total Bilirubin: 0.4 mg/dL (ref 0.3–1.2)
Total Protein: 6.3 g/dL — ABNORMAL LOW (ref 6.5–8.1)

## 2019-04-19 LAB — PHOSPHORUS: Phosphorus: 3.8 mg/dL (ref 2.5–4.6)

## 2019-04-19 LAB — CBC WITH DIFFERENTIAL/PLATELET
Abs Immature Granulocytes: 0.01 10*3/uL (ref 0.00–0.07)
Basophils Absolute: 0 10*3/uL (ref 0.0–0.1)
Basophils Relative: 0 %
Eosinophils Absolute: 0 10*3/uL (ref 0.0–0.5)
Eosinophils Relative: 0 %
HCT: 32.9 % — ABNORMAL LOW (ref 36.0–46.0)
Hemoglobin: 10.9 g/dL — ABNORMAL LOW (ref 12.0–15.0)
Immature Granulocytes: 0 %
Lymphocytes Relative: 24 %
Lymphs Abs: 1.3 10*3/uL (ref 0.7–4.0)
MCH: 31.6 pg (ref 26.0–34.0)
MCHC: 33.1 g/dL (ref 30.0–36.0)
MCV: 95.4 fL (ref 80.0–100.0)
Monocytes Absolute: 0.6 10*3/uL (ref 0.1–1.0)
Monocytes Relative: 11 %
Neutro Abs: 3.4 10*3/uL (ref 1.7–7.7)
Neutrophils Relative %: 65 %
Platelets: 286 10*3/uL (ref 150–400)
RBC: 3.45 MIL/uL — ABNORMAL LOW (ref 3.87–5.11)
RDW: 12 % (ref 11.5–15.5)
WBC: 5.3 10*3/uL (ref 4.0–10.5)
nRBC: 0 % (ref 0.0–0.2)

## 2019-04-19 LAB — D-DIMER, QUANTITATIVE: D-Dimer, Quant: 0.33 ug/mL-FEU (ref 0.00–0.50)

## 2019-04-19 LAB — MAGNESIUM: Magnesium: 2.1 mg/dL (ref 1.7–2.4)

## 2019-04-19 LAB — FERRITIN: Ferritin: 144 ng/mL (ref 11–307)

## 2019-04-19 LAB — C-REACTIVE PROTEIN: CRP: 0.5 mg/dL (ref <1.0)

## 2019-04-19 MED ORDER — MAGNESIUM CITRATE PO SOLN: 1.0000 | ORAL | Status: DC | PRN

## 2019-04-19 MED ORDER — POLYETHYLENE GLYCOL 3350 17 G PO PACK: 17.0000 g | PACK | Freq: Two times a day (BID) | ORAL | Status: DC | PRN

## 2019-04-19 NOTE — Plan of Care (Signed)

## 2019-04-19 NOTE — Progress Notes (Signed)
PROGRESS NOTE  Lisa [name] LFY:[id number] DOB: 1970-07-13   PCP: Mateo Flow, MD  Patient is from: Home.  Active at baseline.  DOA: 04/17/2019 LOS: 2  Brief Narrative / Interim history: 48 year old female with history of DDD, chronic back pain, migraine and GERD who tested positive for COVID-19 on 04/04/2019 presenting with worsening S OB, dry cough, diaphoresis and hypoxemia.  Reportedly desaturated to 80% on room air at home that prompted her to come to ED.  In ED, hemodynamically stable.  Saturation 98% on RA.  Reportedly desaturated to 82% with ambulation on RA.  CBC and BMP without significant finding.  High-sensitivity troponin negative x2.  CXR with ill-defined airspace opacity in RLL.  CTA chest negative for PE but with patchy peripheral airspace disease bilaterally.   Started on remdesivir and Decadron and admitted for acute hypoxic respiratory failure due to Covid pneumonia.   Subjective: No major events overnight of this morning.  Reports pain across the chest bilaterally.  She describes the pain as tightness.  Pain severity 7/10.  Reports dyspnea with minimal exertion.  Continues to have significant cough.  Reports intermittent sharp abdominal pain.  Has not had a bowel movement despite MiraLAX and Senokot yesterday.  Denies UTI symptoms.  Denies nausea or vomiting.  Objective: Vitals:   04/18/19 0541 04/18/19 1302 04/18/19 2132 04/19/19 0503  BP: (!) 94/55 (!) 103/46 (!) 109/56 (!) 95/50  Pulse: 65 75 (!) 57 (!) 52  Resp: 18 18 16 18   Temp: 98.5 F (36.9 C) 98.1 F (36.7 C) 98.7 F (37.1 C) 98.8 F (37.1 C)  TempSrc: Oral Oral Oral Oral  SpO2: 97% 100% 99% 100%  Weight:      Height:        Intake/Output Summary (Last 24 hours) at 04/19/2019 1244 Last data filed at 04/18/2019 1500 Gross per 24 hour  Intake 240 ml  Output --  Net 240 ml   Filed Weights   04/17/19 2256  Weight: 55.8 kg    Examination:  GENERAL: No acute distress.  Appears well.   HEENT: MMM.  Vision and hearing grossly intact.  NECK: Supple.  No apparent JVD.  RESP:  No IWOB. Good air movement bilaterally. CVS:  RRR. Heart sounds normal.  ABD/GI/GU: Bowel sounds present. Soft.  Mild RUQ tenderness but no Murphy signs. MSK/EXT:  Moves extremities. No apparent deformity or edema.  Tenderness across the chest SKIN: no apparent skin lesion or wound NEURO: Awake, alert and oriented appropriately.  No apparent focal neuro deficit. PSYCH: Calm. Normal affect.  Procedures:  None  Assessment & Plan: Acute respiratory failure with hypoxia due to COVID-19 pneumonia -Reportedly desaturated to 80% at home, and 82% with ambulation on RA in the ED -Stable with significant dyspnea with minimal exertion. -Inflammatory markers resolved. Recent Labs    04/17/19 2057 04/18/19 0323 04/19/19 0354  DDIMER 1.40* 0.28 0.33  FERRITIN 187 159 144  LDH 137  --   --   CRP <0.5 0.6 0.5  -Continue remdesivir and Decadron-12/11>> -Incentive spirometry, flutter valve, inhalers, antitussive and vitamins -OOB and daily ambulation  Atypical chest pain: Pain is not exertional.  Worse with movement.  Reproducible to palpation suggesting musculoskeletal etiology.  High-sensitivity troponin negative x2 on admit.  EKG with lateral T wave changes, RAE and RAD.  -Analgesics and K pad  Non-anion gap metabolic acidosis: IV fluid?  Resolved. -Continue monitoring  Transaminitis/elevated alk phos and GGT: Due to COVID-19?  Had nausea, vomiting and diarrhea previously.  No GI symptoms now.  Mild RUQ tenderness on exam but no Murphy.  Acute hepatitis panel negative.  She is status post cholecystectomy -RUQ ultrasound -Continue trending  DDD/chronic back pain: Stable -Continue home MS Contin, as needed Norco and Robaxin  Constipation -Continue Senokot-S and MiraLAX twice daily as needed -Mag citrate if no resolution  GERD -Continue Pepcid               DVT prophylaxis: Subcu  Lovenox Code Status: Full code but does not want to be on ventilator if no success with resuscitation.  She says she does not want her husband to make a decision as he won't be able to honor her wish. Family Communication: Patient and/or RN. Available if any question. Disposition Plan: Remains inpatient Consultants: None   Microbiology summarized: COVID-19 screen positive. HIV screen negative.  Sch Meds:  Scheduled Meds: . dexamethasone (DECADRON) injection  6 mg Intravenous Q24H  . enoxaparin (LOVENOX) injection  40 mg Subcutaneous Q24H  . famotidine  40 mg Oral Daily  . Ipratropium-Albuterol  1 puff Inhalation Q6H  . Melatonin  30 mg Oral QHS  . methocarbamol  750 mg Oral TID  . morphine  30 mg Oral TID  . vitamin C  500 mg Oral Daily  . zinc sulfate  220 mg Oral Daily   Continuous Infusions: . remdesivir 100 mg in NS 100 mL 100 mg (04/18/19 1101)   PRN Meds:.chlorpheniramine-HYDROcodone, guaiFENesin-dextromethorphan, HYDROcodone-acetaminophen, magnesium citrate, ondansetron **OR** ondansetron (ZOFRAN) IV, polyethylene glycol, senna-docusate  Antimicrobials: Anti-infectives (From admission, onward)   Start     Dose/Rate Route Frequency Ordered Stop   04/18/19 1000  remdesivir 100 mg in sodium chloride 0.9 % 100 mL IVPB     100 mg 200 mL/hr over 30 Minutes Intravenous Daily 04/17/19 2034 04/22/19 0959   04/17/19 2045  remdesivir 200 mg in sodium chloride 0.9% 250 mL IVPB     200 mg 580 mL/hr over 30 Minutes Intravenous Once 04/17/19 2034 04/17/19 2221       I have personally reviewed the following labs and images: CBC: Recent Labs  Lab 04/17/19 1509 04/18/19 0323 04/19/19 0354  WBC 4.6 3.2* 5.3  NEUTROABS 3.1 2.7 3.4  HGB 13.6 12.3 10.9*  HCT 41.1 37.1 32.9*  MCV 95.6 94.6 95.4  PLT 352 322 286   BMP &GFR Recent Labs  Lab 04/17/19 1509 04/17/19 2057 04/18/19 0323 04/19/19 0354  NA 141 140 140 142  K 4.1 3.9 3.8 3.5  CL 109 108 110 110  CO2 23 24 18*  24  GLUCOSE 113* 143* 191* 120*  BUN 17 16 16 18   CREATININE 0.63 0.72 0.45 0.58  CALCIUM 9.8 9.7 9.5 9.4  MG  --   --  2.2 2.1  PHOS  --   --  2.5 3.8   Estimated Creatinine Clearance: 74.9 mL/min (by C-G formula based on SCr of 0.58 mg/dL). Liver & Pancreas: Recent Labs  Lab 04/17/19 2057 04/18/19 0323 04/19/19 0354  AST 44* 36 57*  ALT 156* 125* 108*  ALKPHOS 257* 226* 228*  BILITOT 0.6 0.9 0.4  PROT 8.1 7.0 6.3*  ALBUMIN 4.3 3.8 3.3*   No results for input(s): LIPASE, AMYLASE in the last 168 hours. No results for input(s): AMMONIA in the last 168 hours. Diabetic: No results for input(s): HGBA1C in the last 72 hours. No results for input(s): GLUCAP in the last 168 hours. Cardiac Enzymes: No results for input(s): CKTOTAL, CKMB, CKMBINDEX, TROPONINI in the last 168  hours. No results for input(s): PROBNP in the last 8760 hours. Coagulation Profile: No results for input(s): INR, PROTIME in the last 168 hours. Thyroid Function Tests: No results for input(s): TSH, T4TOTAL, FREET4, T3FREE, THYROIDAB in the last 72 hours. Lipid Profile: Recent Labs    04/17/19 2057  TRIG 69   Anemia Panel: Recent Labs    04/18/19 0323 04/19/19 0354  FERRITIN 159 144   Urine analysis: No results found for: COLORURINE, APPEARANCEUR, LABSPEC, PHURINE, GLUCOSEU, HGBUR, BILIRUBINUR, KETONESUR, PROTEINUR, UROBILINOGEN, NITRITE, LEUKOCYTESUR Sepsis Labs: Invalid input(s): PROCALCITONIN, [place]  Microbiology: Recent Results (from the past 240 hour(s))  Blood Culture (routine x 2)     Status: None (Preliminary result)   Collection Time: 04/17/19  8:57 PM   Specimen: BLOOD RIGHT FOREARM  Result Value Ref Range Status   Specimen Description   Final    BLOOD RIGHT FOREARM Performed at [facility] [address], [place], [place] [address]    Special Requests   Final    BOTTLES DRAWN AEROBIC AND ANAEROBIC Blood Culture results may not be optimal due to an  inadequate volume of blood received in culture bottles Performed at [facility] [address], [place], [place] [address]    Culture   Final    NO GROWTH 1 DAY Performed at [facility], [place] [address], [place], [place] [address]    Report Status PENDING  Incomplete  Blood Culture (routine x 2)     Status: None (Preliminary result)   Collection Time: 04/17/19  8:58 PM   Specimen: BLOOD LEFT HAND  Result Value Ref Range Status   Specimen Description   Final    BLOOD LEFT HAND Performed at [facility] [address] [place], [place] [address]    Special Requests   Final    BOTTLES DRAWN AEROBIC AND ANAEROBIC Blood Culture adequate volume Performed at [facility] [address], [place], [place] [address]    Culture   Final    NO GROWTH 1 DAY Performed at [facility], [place] [address] [place], [place] [address]    Report Status PENDING  Incomplete  SARS CORONAVIRUS 2 (TAT 6-24 HRS) Nasopharyngeal Nasopharyngeal Swab     Status: Abnormal   Collection Time: 04/17/19 10:07 PM   Specimen: Nasopharyngeal Swab  Result Value Ref Range Status   SARS Coronavirus 2 POSITIVE (A) NEGATIVE Final    Comment: RESULT CALLED TO, READ BACK BY AND VERIFIED WITH: ARDAE LEA RN.[email or web address]  ON 12.12.2020 BY TCALDWELL MT. (NOTE) SARS-CoV-2 target nucleic acids are DETECTED. The SARS-CoV-2 RNA is generally detectable in upper and lower respiratory specimens during the acute phase of infection. Positive results are indicative of the presence of SARS-CoV-2 RNA. Clinical correlation with patient history and other diagnostic information is  necessary to determine patient infection status. Positive results do not rule out bacterial infection or co-infection with other viruses.  The expected result is Negative. Fact Sheet for Patients: SugarRoll.be Fact Sheet for Healthcare  Providers: https://www.woods-mathews.com/ This test is not yet approved or cleared by the [place] FDA and  has been authorized for detection and/or diagnosis of SARS-CoV-2 by FDA under an Emergency Use Authorization (EUA). This EUA will remain  in effect (meaning this test can be  used) for the duration of the COVID-19 declaration under Section 564(b)(1) of the Act, 21 U.S.C. section 360bbb-3(b)(1), unless the authorization is terminated or revoked sooner. Performed at [facility],  [address], [place], [place] [address]     Radiology Studies: No results found.   Lorien Shingler T. [place]  If 7PM-7AM, please contact night-coverage [email or web address] Password [facility] 04/19/2019, 12:44 PM

## 2019-04-20 DIAGNOSIS — U071 COVID-19: Principal | ICD-10-CM

## 2019-04-20 DIAGNOSIS — E872 Acidosis: Secondary | ICD-10-CM

## 2019-04-20 DIAGNOSIS — R748 Abnormal levels of other serum enzymes: Secondary | ICD-10-CM

## 2019-04-20 DIAGNOSIS — J069 Acute upper respiratory infection, unspecified: Secondary | ICD-10-CM

## 2019-04-20 DIAGNOSIS — K219 Gastro-esophageal reflux disease without esophagitis: Secondary | ICD-10-CM

## 2019-04-20 DIAGNOSIS — M5030 Other cervical disc degeneration, unspecified cervical region: Secondary | ICD-10-CM

## 2019-04-20 DIAGNOSIS — G8929 Other chronic pain: Secondary | ICD-10-CM

## 2019-04-20 DIAGNOSIS — R0789 Other chest pain: Secondary | ICD-10-CM

## 2019-04-20 DIAGNOSIS — M545 Low back pain: Secondary | ICD-10-CM

## 2019-04-20 DIAGNOSIS — E876 Hypokalemia: Secondary | ICD-10-CM

## 2019-04-20 LAB — COMPREHENSIVE METABOLIC PANEL
ALT: 115 U/L — ABNORMAL HIGH (ref 0–44)
AST: 64 U/L — ABNORMAL HIGH (ref 15–41)
Albumin: 3.5 g/dL (ref 3.5–5.0)
Alkaline Phosphatase: 261 U/L — ABNORMAL HIGH (ref 38–126)
Anion gap: 9 (ref 5–15)
BUN: 19 mg/dL (ref 6–20)
CO2: 24 mmol/L (ref 22–32)
Calcium: 9.5 mg/dL (ref 8.9–10.3)
Chloride: 107 mmol/L (ref 98–111)
Creatinine, Ser: 0.51 mg/dL (ref 0.44–1.00)
GFR calc Af Amer: >60 mL/min (ref >60)
GFR calc non Af Amer: >60 mL/min (ref >60)
Glucose, Bld: 142 mg/dL — ABNORMAL HIGH (ref 70–99)
Potassium: 3.2 mmol/L — ABNORMAL LOW (ref 3.5–5.1)
Sodium: 140 mmol/L (ref 135–145)
Total Bilirubin: 0.2 mg/dL — ABNORMAL LOW (ref 0.3–1.2)
Total Protein: 6.6 g/dL (ref 6.5–8.1)

## 2019-04-20 LAB — CBC WITH DIFFERENTIAL/PLATELET
Abs Immature Granulocytes: 0.02 10*3/uL (ref 0.00–0.07)
Basophils Absolute: 0 10*3/uL (ref 0.0–0.1)
Basophils Relative: 0 %
Eosinophils Absolute: 0 10*3/uL (ref 0.0–0.5)
Eosinophils Relative: 0 %
HCT: 34 % — ABNORMAL LOW (ref 36.0–46.0)
Hemoglobin: 11.2 g/dL — ABNORMAL LOW (ref 12.0–15.0)
Immature Granulocytes: 0 %
Lymphocytes Relative: 23 %
Lymphs Abs: 1.2 10*3/uL (ref 0.7–4.0)
MCH: 31.6 pg (ref 26.0–34.0)
MCHC: 32.9 g/dL (ref 30.0–36.0)
MCV: 96 fL (ref 80.0–100.0)
Monocytes Absolute: 0.4 10*3/uL (ref 0.1–1.0)
Monocytes Relative: 8 %
Neutro Abs: 3.5 10*3/uL (ref 1.7–7.7)
Neutrophils Relative %: 69 %
Platelets: 291 10*3/uL (ref 150–400)
RBC: 3.54 MIL/uL — ABNORMAL LOW (ref 3.87–5.11)
RDW: 11.9 % (ref 11.5–15.5)
WBC: 5 10*3/uL (ref 4.0–10.5)
nRBC: 0 % (ref 0.0–0.2)

## 2019-04-20 LAB — FERRITIN: Ferritin: 136 ng/mL (ref 11–307)

## 2019-04-20 LAB — C-REACTIVE PROTEIN: CRP: 0.6 mg/dL (ref <1.0)

## 2019-04-20 LAB — MAGNESIUM: Magnesium: 2 mg/dL (ref 1.7–2.4)

## 2019-04-20 LAB — PHOSPHORUS: Phosphorus: 3.6 mg/dL (ref 2.5–4.6)

## 2019-04-20 LAB — D-DIMER, QUANTITATIVE: D-Dimer, Quant: <0.27 ug/mL-FEU (ref 0.00–0.50)

## 2019-04-20 MED ORDER — POTASSIUM CHLORIDE CRYS ER 20 MEQ PO TBCR
40.0000 meq | EXTENDED_RELEASE_TABLET | ORAL | Status: AC
  Administered 2019-04-20 (×2): 40 meq via ORAL
  Filled 2019-04-20 (×2): qty 2

## 2019-04-20 NOTE — Progress Notes (Signed)
SATURATION QUALIFICATIONS: (This note is used to comply with regulatory documentation for home oxygen)  Patient Saturations on Room Air at Rest = 98%  Patient Saturations on Room Air while Ambulating = 100%   Please briefly explain why patient needs home oxygen: Not necessary. Patient weaned to RA.

## 2019-04-20 NOTE — Progress Notes (Signed)
PROGRESS NOTE  Lisa [name] AST:[id number] DOB: 1970/10/11   PCP: Mateo Flow, MD  Patient is from: Home.  Active at baseline.  DOA: 04/17/2019 LOS: 3  Brief Narrative / Interim history: 48 year old female with history of DDD, chronic back pain, migraine and GERD who tested positive for COVID-19 on 04/04/2019 presenting with worsening S OB, dry cough, diaphoresis and hypoxemia.  Reportedly desaturated to 80% on room air at home that prompted her to come to ED.  In ED, hemodynamically stable.  Saturation 98% on RA.  Reportedly desaturated to 82% with ambulation on RA.  CBC and BMP without significant finding.  High-sensitivity troponin negative x2.  CXR with ill-defined airspace opacity in RLL.  CTA chest negative for PE but with patchy peripheral airspace disease bilaterally.   Started on remdesivir and Decadron and admitted for acute hypoxic respiratory failure due to Covid pneumonia.   Subjective: No major events overnight of this morning.  Complaints worsening back pain.  She has chronic back pain.  Chest pain improved this morning.  Reports significant shortness of breath with minimal exertion.  Denies nausea, emesis or abdominal pain.  Objective: Vitals:   04/19/19 1414 04/19/19 2147 04/20/19 0446 04/20/19 1000  BP: 100/63 (!) 105/46 (!) 121/59 (!) 161/88  Pulse: 72 62 (!) 52 87  Resp: '20 18 20 20  ' Temp: 98.8 F (37.1 C) 98.8 F (37.1 C) 98.5 F (36.9 C)   TempSrc: Oral Oral Oral   SpO2: 100% 98% 100% 97%  Weight:      Height:        Intake/Output Summary (Last 24 hours) at 04/20/2019 1205 Last data filed at 04/20/2019 0947 Gross per 24 hour  Intake 1180 ml  Output -  Net 1180 ml   Filed Weights   04/17/19 2256  Weight: 55.8 kg    Examination:  GENERAL: No acute distress.  Appears well.  HEENT: MMM.  Vision and hearing grossly intact.  NECK: Supple.  No apparent JVD.  RESP:  No IWOB. Good air movement bilaterally. CVS:  RRR. Heart sounds normal.   ABD/GI/GU: Bowel sounds present. Soft. Non tender.  MSK/EXT: Tenderness across a chest anteriorly. SKIN: no apparent skin lesion or wound NEURO: Awake, alert and oriented appropriately.  No apparent focal neuro deficit. PSYCH: Calm. Normal affect.  Procedures:  None  Assessment & Plan: Acute respiratory failure with hypoxia due to COVID-19 pneumonia -Reportedly desaturated to 80% at home, and 82% with ambulation on RA in the ED -Still with significant dyspnea with minimal exertion despite excellent saturation at rest. -Inflammatory markers resolved. Recent Labs    04/17/19 2057 04/18/19 0323 04/19/19 0354 04/20/19 0407  DDIMER 1.40* 0.28 0.33 <0.27  FERRITIN 187 159 144 136  LDH 137  --   --   --   CRP <0.5 0.6 0.5 0.6  -Continue remdesivir and Decadron-12/11>> -Incentive spirometry, flutter valve, inhalers, antitussive and vitamins -OOB and daily ambulation  Atypical chest pain: Pain is not exertional.  Worse with movement.  Reproducible to palpation suggesting musculoskeletal etiology.  High-sensitivity troponin negative x2 on admit.  EKG with lateral T wave changes, RAE and RAD.  -Analgesics and K pad  Non-anion gap metabolic acidosis: IV fluid?  Resolved. -Continue monitoring  Transaminitis/elevated alk phos and GGT: Likely due to COVID-19 and remdesivir.  Had nausea, vomiting and diarrhea previously.  No GI symptoms now.  Mild RUQ tenderness on exam but no Murphy.  Acute hepatitis panel negative.  She is status post cholecystectomy.  RUQ  ultrasound not impressive. -Continue trending  Hypochondriasis/DDD/chronic back pain: Stable -Continue home MS Contin, as needed Norco and Robaxin  Constipation: Resolved. -Continue Senokot-S and MiraLAX twice daily as needed  GERD -Continue Pepcid  Hypokalemia -Replenish and recheck.               DVT prophylaxis: Subcu Lovenox Code Status: Full code but does not want to be on ventilator if no success with  resuscitation.  She says she does not want her husband to make a decision as he won't be able to honor her wish. Family Communication: Patient and/or RN. Available if any question. Disposition Plan: Remains inpatient due to significant respiratory distress and desaturation with minimal exertion Consultants: None   Microbiology summarized: COVID-19 screen positive. HIV screen negative.  Sch Meds:  Scheduled Meds: . dexamethasone (DECADRON) injection  6 mg Intravenous Q24H  . enoxaparin (LOVENOX) injection  40 mg Subcutaneous Q24H  . famotidine  40 mg Oral Daily  . Ipratropium-Albuterol  1 puff Inhalation Q6H  . Melatonin  30 mg Oral QHS  . methocarbamol  750 mg Oral TID  . morphine  30 mg Oral TID  . potassium chloride  40 mEq Oral Q4H  . vitamin C  500 mg Oral Daily  . zinc sulfate  220 mg Oral Daily   Continuous Infusions: . remdesivir 100 mg in NS 100 mL 100 mg (04/20/19 0852)   PRN Meds:.chlorpheniramine-HYDROcodone, guaiFENesin-dextromethorphan, HYDROcodone-acetaminophen, magnesium citrate, ondansetron **OR** ondansetron (ZOFRAN) IV, polyethylene glycol, senna-docusate  Antimicrobials: Anti-infectives (From admission, onward)   Start     Dose/Rate Route Frequency Ordered Stop   04/18/19 1000  remdesivir 100 mg in sodium chloride 0.9 % 100 mL IVPB     100 mg 200 mL/hr over 30 Minutes Intravenous Daily 04/17/19 2034 04/22/19 0959   04/17/19 2045  remdesivir 200 mg in sodium chloride 0.9% 250 mL IVPB     200 mg 580 mL/hr over 30 Minutes Intravenous Once 04/17/19 2034 04/17/19 2221       I have personally reviewed the following labs and images: CBC: Recent Labs  Lab 04/17/19 1509 04/18/19 0323 04/19/19 0354 04/20/19 0407  WBC 4.6 3.2* 5.3 5.0  NEUTROABS 3.1 2.7 3.4 3.5  HGB 13.6 12.3 10.9* 11.2*  HCT 41.1 37.1 32.9* 34.0*  MCV 95.6 94.6 95.4 96.0  PLT 352 322 286 291   BMP &GFR Recent Labs  Lab 04/17/19 1509 04/17/19 2057 04/18/19 0323 04/19/19 0354  04/20/19 0407  NA 141 140 140 142 140  K 4.1 3.9 3.8 3.5 3.2*  CL 109 108 110 110 107  CO2 23 24 18* 24 24  GLUCOSE 113* 143* 191* 120* 142*  BUN '17 16 16 18 19  ' CREATININE 0.63 0.72 0.45 0.58 0.51  CALCIUM 9.8 9.7 9.5 9.4 9.5  MG  --   --  2.2 2.1 2.0  PHOS  --   --  2.5 3.8 3.6   Estimated Creatinine Clearance: 74.9 mL/min (by C-G formula based on SCr of 0.51 mg/dL). Liver & Pancreas: Recent Labs  Lab 04/17/19 2057 04/18/19 0323 04/19/19 0354 04/20/19 0407  AST 44* 36 57* 64*  ALT 156* 125* 108* 115*  ALKPHOS 257* 226* 228* 261*  BILITOT 0.6 0.9 0.4 0.2*  PROT 8.1 7.0 6.3* 6.6  ALBUMIN 4.3 3.8 3.3* 3.5   No results for input(s): LIPASE, AMYLASE in the last 168 hours. No results for input(s): AMMONIA in the last 168 hours. Diabetic: No results for input(s): HGBA1C in the last 72  hours. No results for input(s): GLUCAP in the last 168 hours. Cardiac Enzymes: No results for input(s): CKTOTAL, CKMB, CKMBINDEX, TROPONINI in the last 168 hours. No results for input(s): PROBNP in the last 8760 hours. Coagulation Profile: No results for input(s): INR, PROTIME in the last 168 hours. Thyroid Function Tests: No results for input(s): TSH, T4TOTAL, FREET4, T3FREE, THYROIDAB in the last 72 hours. Lipid Profile: Recent Labs    04/17/19 2057  TRIG 69   Anemia Panel: Recent Labs    04/19/19 0354 04/20/19 0407  FERRITIN 144 136   Urine analysis: No results found for: COLORURINE, APPEARANCEUR, LABSPEC, PHURINE, GLUCOSEU, HGBUR, BILIRUBINUR, KETONESUR, PROTEINUR, UROBILINOGEN, NITRITE, LEUKOCYTESUR Sepsis Labs: Invalid input(s): PROCALCITONIN, [place]  Microbiology: Recent Results (from the past 240 hour(s))  Blood Culture (routine x 2)     Status: None (Preliminary result)   Collection Time: 04/17/19  8:57 PM   Specimen: BLOOD RIGHT FOREARM  Result Value Ref Range Status   Specimen Description   Final    BLOOD RIGHT FOREARM Performed at [facility] [address] [place], [place] [address]    Special Requests   Final    BOTTLES DRAWN AEROBIC AND ANAEROBIC Blood Culture results may not be optimal due to an inadequate volume of blood received in culture bottles Performed at [facility] [address] [place], [place] [address]    Culture   Final    NO GROWTH 2 DAYS Performed at [facility] [address], [place], [place] [address]    Report Status PENDING  Incomplete  Blood Culture (routine x 2)     Status: None (Preliminary result)   Collection Time: 04/17/19  8:58 PM   Specimen: BLOOD LEFT HAND  Result Value Ref Range Status   Specimen Description   Final    BLOOD LEFT HAND Performed at [facility] [address] [place], [place] [address]    Special Requests   Final    BOTTLES DRAWN AEROBIC AND ANAEROBIC Blood Culture adequate volume Performed at [facility] [address], [place], [place] [address]    Culture   Final    NO GROWTH 2 DAYS Performed at [facility] [address] [place],  [address]    Report Status PENDING  Incomplete  SARS CORONAVIRUS 2 (TAT 6-24 HRS) Nasopharyngeal Nasopharyngeal Swab     Status: Abnormal   Collection Time: 04/17/19 10:07 PM   Specimen: Nasopharyngeal Swab  Result Value Ref Range Status   SARS Coronavirus 2 POSITIVE (A) NEGATIVE Final    Comment: RESULT CALLED TO, READ BACK BY AND VERIFIED WITH: ARDAE LEA RN.'@[email or web address]'  ON 12.12.2020 BY TCALDWELL MT. (NOTE) SARS-CoV-2 target nucleic acids are DETECTED. The SARS-CoV-2 RNA is generally detectable in upper and lower respiratory specimens during the acute phase of infection. Positive results are indicative of the presence of SARS-CoV-2 RNA. Clinical correlation with patient history and other diagnostic information is  necessary to determine patient infection status. Positive results do not rule out bacterial infection or co-infection with  other viruses.  The expected result is Negative. Fact Sheet for Patients: SugarRoll.be Fact Sheet for Healthcare Providers: https://www.woods-mathews.com/ This test is not yet approved or cleared by the [place] FDA and  has been authorized for detection and/or diagnosis of SARS-CoV-2 by FDA under an Emergency Use Authorization (EUA). This EUA will remain  in effect (meaning this test can be  used) for the duration of the COVID-19  declaration under Section 564(b)(1) of the Act, 21 U.S.C. section 360bbb-3(b)(1), unless the authorization is terminated or revoked sooner. Performed at [facility], [place] [address], [place], [place] [address]     Radiology Studies: US Abdomen Limited RUQ  Result Date: 04/19/2019 CLINICAL DATA:  Right upper quadrant pain, COVID-19 positivity EXAM: ULTRASOUND ABDOMEN LIMITED RIGHT UPPER QUADRANT COMPARISON:  02/06/2018 FINDINGS: Gallbladder: Surgically removed Common bile duct: Diameter: 4.7 mm. Liver: No focal lesion identified. Within normal limits in parenchymal echogenicity. Portal vein is patent on color Doppler imaging with normal direction of blood flow towards the liver. Other: None. IMPRESSION: Status post cholecystectomy.  No acute abnormality is noted. Electronically Signed   By: Inez Catalina M.D.   On: 04/19/2019 15:11     Kataryna Mcquilkin T. [place]  If 7PM-7AM, please contact night-coverage [email or web address] Password [email or web address] 04/20/2019, 12:05 PM

## 2019-04-20 NOTE — Discharge Instructions (Signed)
COVID-19 To protect others: Take steps to prevent the virus from spreading to others.  Stay away from other and members of your household at least for 10 days. Let healthy household members care for children and pets, if possible. If you have to care for children or pets, wash your hands often and wear a mask. If possible, stay in your own room, separate from others. Use a different bathroom.Make sure that all people in your household wash their hands well and often.  Leave your house only to seek medical care. Do not use public transport.  Do not travel while you are sick.  Wash your hands often with soap and water for 20 seconds. If soap and water are not available, use alcohol-based hand sanitizer.  Cough or sneeze into a tissue or your sleeve or elbow. Do not cough or sneeze into your hand or into the air.  Wear a cloth face covering or face mask.  Get help or return to the hospital right away if:  You have trouble breathing.  You have pain or pressure in your chest.  You have confusion.  You have bluish lips and fingernails.  You have difficulty waking from sleep.  You have symptoms that get worse. These symptoms may represent a serious problem that is an emergency. Do not wait to see if the symptoms will go away. Get medical help right away. Call your local emergency services (911 in the [place] Do not drive yourself to the hospital. Let the emergency medical personnel know if you think you have COVID-19.  To protect yourself in the future:   Do not travel to areas where COVID-19 is a risk. The areas where COVID-19 is reported change often. To identify high-risk areas and travel restrictions, check the CDC travel website: FatFares.com.br  If you live in, or must travel to, an area where COVID-19 is a risk, take precautions to avoid infection. ? Stay away from people who are sick. ? Wash your hands often with soap and water for 20 seconds. If soap and water are  not available, use an alcohol-based hand sanitizer. ? Avoid touching your mouth, face, eyes, or nose. ? Avoid going out in public, follow guidance from your state and local health authorities. ? If you must go out in public, wear a cloth face covering or face mask. ? Disinfect objects and surfaces that are frequently touched every day. This may include:  Counters and tables.  Doorknobs and light switches.  Sinks and faucets.  Electronics, such as phones, remote controls, keyboards, computers, and tablets.   Where to find more information  Centers for Disease Control and Prevention: PurpleGadgets.be  World Health Organization: https://www.castaneda.info/

## 2019-04-20 NOTE — TOC Progression Note (Signed)
Transition of Care [facility]) - Progression Note    Patient Details  Name: Lisa [name] MRN: [id number] Date of Birth: May 30, 1970  Transition of [facility]) CM/SW Contact  Purcell Mouton, RN Phone Number: 04/20/2019, 4:34 PM  Clinical Narrative:    Pt home with spouse. COVID positive on 11/28 increased SOB, Sats 80 at home. Pt admitted with a positive COVID on 12/12. Will continue to follow for discharge needs.       Expected Discharge Plan and Services                                                 Social Determinants of Health (SDOH) Interventions    Readmission Risk Interventions No flowsheet data found.

## 2019-04-21 LAB — CBC WITH DIFFERENTIAL/PLATELET
Abs Immature Granulocytes: 0.03 10*3/uL (ref 0.00–0.07)
Basophils Absolute: 0 10*3/uL (ref 0.0–0.1)
Basophils Relative: 0 %
Eosinophils Absolute: 0 10*3/uL (ref 0.0–0.5)
Eosinophils Relative: 0 %
HCT: 34.4 % — ABNORMAL LOW (ref 36.0–46.0)
Hemoglobin: 11.3 g/dL — ABNORMAL LOW (ref 12.0–15.0)
Immature Granulocytes: 1 %
Lymphocytes Relative: 29 %
Lymphs Abs: 1.5 10*3/uL (ref 0.7–4.0)
MCH: 31.3 pg (ref 26.0–34.0)
MCHC: 32.8 g/dL (ref 30.0–36.0)
MCV: 95.3 fL (ref 80.0–100.0)
Monocytes Absolute: 0.6 10*3/uL (ref 0.1–1.0)
Monocytes Relative: 11 %
Neutro Abs: 3.2 10*3/uL (ref 1.7–7.7)
Neutrophils Relative %: 59 %
Platelets: 326 10*3/uL (ref 150–400)
RBC: 3.61 MIL/uL — ABNORMAL LOW (ref 3.87–5.11)
RDW: 11.9 % (ref 11.5–15.5)
WBC: 5.3 10*3/uL (ref 4.0–10.5)
nRBC: 0 % (ref 0.0–0.2)

## 2019-04-21 LAB — COMPREHENSIVE METABOLIC PANEL
ALT: 132 U/L — ABNORMAL HIGH (ref 0–44)
AST: 83 U/L — ABNORMAL HIGH (ref 15–41)
Albumin: 3.5 g/dL (ref 3.5–5.0)
Alkaline Phosphatase: 284 U/L — ABNORMAL HIGH (ref 38–126)
Anion gap: 8 (ref 5–15)
BUN: 12 mg/dL (ref 6–20)
CO2: 25 mmol/L (ref 22–32)
Calcium: 9.4 mg/dL (ref 8.9–10.3)
Chloride: 106 mmol/L (ref 98–111)
Creatinine, Ser: 0.54 mg/dL (ref 0.44–1.00)
GFR calc Af Amer: >60 mL/min (ref >60)
GFR calc non Af Amer: >60 mL/min (ref >60)
Glucose, Bld: 125 mg/dL — ABNORMAL HIGH (ref 70–99)
Potassium: 3.8 mmol/L (ref 3.5–5.1)
Sodium: 139 mmol/L (ref 135–145)
Total Bilirubin: 0.5 mg/dL (ref 0.3–1.2)
Total Protein: 6.4 g/dL — ABNORMAL LOW (ref 6.5–8.1)

## 2019-04-21 LAB — C-REACTIVE PROTEIN: CRP: <0.5 mg/dL (ref <1.0)

## 2019-04-21 LAB — MAGNESIUM: Magnesium: 2 mg/dL (ref 1.7–2.4)

## 2019-04-21 LAB — D-DIMER, QUANTITATIVE: D-Dimer, Quant: 0.28 ug/mL-FEU (ref 0.00–0.50)

## 2019-04-21 LAB — FERRITIN: Ferritin: 282 ng/mL (ref 11–307)

## 2019-04-21 LAB — PHOSPHORUS: Phosphorus: 3.6 mg/dL (ref 2.5–4.6)

## 2019-04-21 MED ORDER — DM-GUAIFENESIN ER 30-600 MG PO TB12: 1.0000 | ORAL_TABLET | Freq: Two times a day (BID) | ORAL | 0 refills | Status: DC

## 2019-04-21 MED ORDER — SENNOSIDES-DOCUSATE SODIUM 8.6-50 MG PO TABS: 1.0000 | ORAL_TABLET | Freq: Two times a day (BID) | ORAL | 0 refills | Status: DC | PRN

## 2019-04-21 NOTE — Discharge Summary (Signed)
Physician Discharge Summary  LING FLESCH EOF:[id number] DOB: 1970-08-18 DOA: 04/17/2019  PCP: Mateo Flow, MD  Admit date: 04/17/2019 Discharge date: 04/21/2019  Admitted From: Home Disposition: Home  Recommendations for Outpatient Follow-up:  1. Follow ups as below. 2. Please obtain CBC/CMP/Mag at follow up 3. Please follow up on the following pending results: None  Home Health: None Equipment/Devices: None  Discharge Condition: Stable CODE STATUS: Stable  Follow-up Information    Mateo Flow, MD Follow up in 2 week(s).   Specialty: Family Medicine Why: return to the ER for new or worsening symptoms Contact information: [place] [address] [phone]           Hospital Course: 48 year old female with history of DDD, chronic back pain, migraine and GERD who tested positive for COVID-19 on 04/04/2019 presenting with worsening S OB, dry cough, diaphoresis and hypoxemia.  Reportedly desaturated to 80% on room air at home that prompted her to come to ED.  In ED, hemodynamically stable.  Saturation 98% on RA.  Reportedly desaturated to 82% with ambulation on RA.  CBC and BMP without significant finding.  High-sensitivity troponin negative x2.  CXR with ill-defined airspace opacity in RLL.  CTA chest negative for PE but with patchy peripheral airspace disease bilaterally.   Started on remdesivir and Decadron and admitted for acute hypoxic respiratory failure due to Covid pneumonia.   Patient received remdesivir and Decadron from 12/11-12/15 with significant improvement in her breathing and oxygenation.   Patient was ambulated on room air and maintained saturation in upper 90s to 100% without distress.  Infection and return precautions discussed on the day of discharge.  See individual coronaries below for more hospital course.  Discharge Diagnoses:  Acute respiratory failure with hypoxia due to COVID-19 pneumonia:  -Respiratory failure  resolved.  Inflammatory markers resolved.  Atypical chest pain: Musculoskeletal.  Pain is not exertional.  Worse with movement.  Reproducible to palpation. High-sensitivity troponin negative x2 on admit.  EKG with lateral T wave changes, RAE and RAD.  Pain basically resolved.  Non-anion gap metabolic acidosis: IV fluid?  Resolved.  Transaminitis/elevated alk phos and GGT: Likely due to COVID-19 and remdesivir.  Had nausea, vomiting and diarrhea previously.  No GI symptoms now.  Mild RUQ tenderness on exam but no Murphy.  Acute hepatitis panel negative.  She is status post cholecystectomy.  RUQ ultrasound not impressive. -Recheck CMP at follow-up.  Hypochondriasis/DDD/chronic back pain: Stable -Continue home MS Contin, as needed Norco and Robaxin  Constipation: Resolved. -Bowel regimen as needed  GERD -Continue home medications  Hypokalemia: Resolved.  Discharge Instructions  Discharge Instructions    MyChart COVID-19 home monitoring program   Complete by: Apr 20, 2019    Is the patient willing to use the [employer] for home monitoring?: No   Call MD for:  difficulty breathing, headache or visual disturbances   Complete by: As directed    Call MD for:  difficulty breathing, headache or visual disturbances   Complete by: As directed    Call MD for:  extreme fatigue   Complete by: As directed    Call MD for:  extreme fatigue   Complete by: As directed    Call MD for:  persistant dizziness or light-headedness   Complete by: As directed    Call MD for:  persistant nausea and vomiting   Complete by: As directed    Call MD for:  persistant nausea and vomiting   Complete  by: As directed    Diet general   Complete by: As directed    Discharge instructions   Complete by: As directed    It has been a pleasure taking care of you! You were hospitalized and treated for COVID-19 infection. You are potentially infectious for the next 10-15 days. We recommend you isolate  yourself and take the necessary precautions to prevent the virus from spreading.  Some of the steps to prevent the virus from spreading to others: Stay away from other and members of your household at least for 10-15 days. Let healthy household members care for children and pets, if possible. If you have to care for children or pets, wash your hands often and wear a mask. If possible, stay in your own room, separate from others. Use a different bathroom.Make sure that all people in your household wash their hands well and often. Leave your house only to seek medical care. Do not use public transport. Do not travel while you are sick. Wash your hands often with soap and water for 20 seconds. If soap and water are not available, use alcohol-based hand sanitizer. Cough or sneeze into a tissue or your sleeve or elbow. Do not cough or sneeze into your hand or into the air. Wear a cloth face covering or face mask.  Return precautions: Get help or return to the hospital right away if: You have trouble breathing. You have pain or pressure in your chest. You have confusion. You have bluish lips and fingernails. You have difficulty waking from sleep. You have symptoms that get worse. These symptoms may represent a serious problem that is an emergency. Do not wait to see if the symptoms will go away. Get medical help right away. Call your local emergency services (911 in the [place] Do not drive yourself to the hospital. Let the emergency medical personnel know if you think you have COVID-19.  To protect yourself in the future:  Do not travel to areas where COVID-19 is a risk. The areas where COVID-19 is reported change often. To identify high-risk areas and travel restrictions, check the CDC travel website: FatFares.com.br If you live in, or must travel to, an area where COVID-19 is a risk, take precautions to avoid infection. Stay away from people who are sick. Wash your hands often with  soap and water for 20 seconds. If soap and water are not available, use an alcohol-based hand sanitizer. Avoid touching your mouth, face, eyes, or nose. Avoid going out in public, follow guidance from your state and local health authorities. If you must go out in public, wear a cloth face covering or face mask. Disinfect objects and surfaces that are frequently touched every day. This may include: Counters and tables. Doorknobs and light switches. Sinks and faucets. Electronics, such as phones, remote controls, keyboards, computers, and tablets.    Where to find more information Centers for Disease Control and Prevention: PurpleGadgets.be World Health Organization: https://www.castaneda.info/     Take steps to prevent the virus from spreading to others. Stay away from other and members of your household at least for 10 days. Let healthy household members care for children and pets, if possible. If you have to care for children or pets, wash your hands often and wear a mask. If possible, stay in your own room, separate from others. Use a different bathroom.Make sure that all people in your household wash their hands well and often. Leave your house only to seek medical care. Do not use  public transport. Do not travel while you are sick. Wash your hands often with soap and water for 20 seconds. If soap and water are not available, use alcohol-based hand sanitizer. Cough or sneeze into a tissue or your sleeve or elbow. Do not cough or sneeze into your hand or into the air. Wear a cloth face covering or face mask.  Get help or return to the hospital right away if: You have trouble breathing. You have pain or pressure in your chest. You have confusion. You have bluish lips and fingernails. You have difficulty waking from sleep. You have symptoms that get worse. These symptoms may represent a serious problem that is an emergency. Do not wait to see if  the symptoms will go away. Get medical help right away. Call your local emergency services (911 in the [place] Do not drive yourself to the hospital. Let the emergency medical personnel know if you think you have COVID-19.  To protect yourself in the future:  Do not travel to areas where COVID-19 is a risk. The areas where COVID-19 is reported change often. To identify high-risk areas and travel restrictions, check the CDC travel website: FatFares.com.br If you live in, or must travel to, an area where COVID-19 is a risk, take precautions to avoid infection. Stay away from people who are sick. Wash your hands often with soap and water for 20 seconds. If soap and water are not available, use an alcohol-based hand sanitizer. Avoid touching your mouth, face, eyes, or nose. Avoid going out in public, follow guidance from your state and local health authorities. If you must go out in public, wear a cloth face covering or face mask. Disinfect objects and surfaces that are frequently touched every day. This may include: Counters and tables. Doorknobs and light switches. Sinks and faucets. Electronics, such as phones, remote controls, keyboards, computers, and tablets.    Where to find more information Centers for Disease Control and Prevention: PurpleGadgets.be World Health Organization: https://www.castaneda.info/   Increase activity slowly   Complete by: As directed    Increase activity slowly   Complete by: As directed      Allergies as of 04/21/2019      Reactions   Azithromycin Other (See Comments)   Unknown   Latex Other (See Comments)   Unknown   Metaxalone Itching   Shellfish Allergy       Medication List    STOP taking these medications   hyoscyamine 0.125 MG SL tablet Commonly known as: LEVSIN SL   linaclotide 145 MCG Caps capsule Commonly known as: Linzess   ranitidine 150 MG tablet Commonly known as: ZANTAC      TAKE these medications   Dexilant 60 MG capsule Generic drug: dexlansoprazole Take 1 capsule (60 mg total) by mouth daily.   dextromethorphan-guaiFENesin 30-600 MG 12hr tablet Commonly known as: MUCINEX DM Take 1 tablet by mouth 2 (two) times daily.   diclofenac 75 MG EC tablet Commonly known as: VOLTAREN TAKE 1 TABLET(75 MG) BY MOUTH TWICE DAILY What changed: See the new instructions.   Elmiron 100 MG capsule Generic drug: pentosan polysulfate Take 100 mg by mouth as needed (IC flareup).   famotidine 40 MG tablet Commonly known as: Pepcid Take 1 tablet (40 mg total) by mouth daily.   Flovent HFA 110 MCG/ACT inhaler Generic drug: fluticasone Inhale 2 puffs into the lungs daily as needed (shortness of breath).   HYDROcodone-acetaminophen 10-325 MG tablet Commonly known as: NORCO Take 1 tablet by mouth every  4 (four) hours as needed.   Melatonin 10 MG Caps Take 30 mg by mouth at bedtime.   methocarbamol 750 MG tablet Commonly known as: ROBAXIN Take 750 mg by mouth 3 (three) times daily.   morphine 30 MG 12 hr tablet Commonly known as: MS CONTIN Take 30 mg by mouth 3 (three) times daily.   ondansetron 4 MG disintegrating tablet Commonly known as: ZOFRAN-ODT Take 4 mg by mouth every 6 (six) hours as needed for nausea/vomiting.   polyethylene glycol 17 g packet Commonly known as: MIRALAX / GLYCOLAX Take 17 g by mouth as needed.   senna-docusate 8.6-50 MG tablet Commonly known as: Senokot-S Take 1 tablet by mouth 2 (two) times daily as needed for moderate constipation.   zonisamide 25 MG capsule Commonly known as: ZONEGRAN Take 25 mg by mouth as needed.       Consultations:  None  Procedures/Studies:  2D Echo: None   DG Chest 2 View  Result Date: 04/17/2019 CLINICAL DATA:  Shortness of breath. Additional history provided: Patient COVID positive 2 weeks ago, continued shortness of breath on exertion, intermittent fever, nausea, vomiting, diarrhea.  EXAM: CHEST - 2 VIEW COMPARISON:  Chest radiograph 04/11/2019 FINDINGS: Heart size within normal limits. Ill-defined airspace opacities within the right lung base. Subtle airspace opacities also questioned within the left lung base. No pleural effusion or pneumothorax. No acute bony abnormality. Surgical clips within the upper abdomen. IMPRESSION: Ill-defined airspace opacities within the right lung base. Subtle airspace opacities also questioned within the left lung base. Findings are consistent with pneumonia given provided history. Radiographic follow-up to resolution recommended. Electronically Signed   By: Kellie Simmering DO   On: 04/17/2019 14:00   CT Angio Chest PE W/Cm &/Or Wo Cm  Result Date: 04/17/2019 CLINICAL DATA:  Tachycardia and hypoxia. COVID-19 positive EXAM: CT ANGIOGRAPHY CHEST WITH CONTRAST TECHNIQUE: Multidetector CT imaging of the chest was performed using the standard protocol during bolus administration of intravenous contrast. Multiplanar CT image reconstructions and MIPs were obtained to evaluate the vascular anatomy. CONTRAST:  170m OMNIPAQUE IOHEXOL 350 MG/ML SOLN COMPARISON:  Chest two view 04/17/2019 FINDINGS: Cardiovascular: Negative for pulmonary embolism. Heart size normal. Thoracic aorta normal. Negative for pericardial effusion Mediastinum/Nodes: Negative for mass or adenopathy Lungs/Pleura: Patchy peripheral airspace disease in the right upper lobe and right lower lobe and in the left lower lobe. Infiltrates most consistent with viral pneumonia given history of COVID-19. No pleural effusion. Negative for collapse. Upper Abdomen: Negative Musculoskeletal: Negative Review of the MIP images confirms the above findings. IMPRESSION: 1. Negative for pulmonary embolism. 2. Patchy peripheral airspace disease bilaterally most consistent with viral pneumonia, given COVID-19 history. Electronically Signed   By: CFranchot GalloM.D.   On: 04/17/2019 17:27   UKoreaAbdomen Limited  RUQ  Result Date: 04/19/2019 CLINICAL DATA:  Right upper quadrant pain, COVID-19 positivity EXAM: ULTRASOUND ABDOMEN LIMITED RIGHT UPPER QUADRANT COMPARISON:  02/06/2018 FINDINGS: Gallbladder: Surgically removed Common bile duct: Diameter: 4.7 mm. Liver: No focal lesion identified. Within normal limits in parenchymal echogenicity. Portal vein is patent on color Doppler imaging with normal direction of blood flow towards the liver. Other: None. IMPRESSION: Status post cholecystectomy.  No acute abnormality is noted. Electronically Signed   By: MInez CatalinaM.D.   On: 04/19/2019 15:11      Discharge Exam: Vitals:   04/20/19 2111 04/21/19 0454  BP: (!) 111/56 (!) 106/51  Pulse: (!) 56 (!) 54  Resp: 16 16  Temp: 98.8 F (37.1  C) 98.7 F (37.1 C)  SpO2: 98% 97%    GENERAL: No acute distress.  Appears well.  HEENT: MMM.  Vision and hearing grossly intact.  NECK: Supple.  No apparent JVD.  RESP:  No IWOB.  Fair aeration bilaterally. CVS:  RRR. Heart sounds normal.  ABD/GI/GU: Bowel sounds present. Soft. Non tender.  MSK/EXT:  Moves extremities. No apparent deformity or edema.  SKIN: no apparent skin lesion or wound NEURO: Awake, alert and oriented appropriately.  No apparent focal neuro deficit. PSYCH: Calm. Normal affect.   The results of significant diagnostics from this hospitalization (including imaging, microbiology, ancillary and laboratory) are listed below for reference.     Microbiology: Recent Results (from the past 240 hour(s))  Blood Culture (routine x 2)     Status: None (Preliminary result)   Collection Time: 04/17/19  8:57 PM   Specimen: BLOOD RIGHT FOREARM  Result Value Ref Range Status   Specimen Description   Final    BLOOD RIGHT FOREARM Performed at [facility] [address] [place], [place] [address]    Special Requests   Final    BOTTLES DRAWN AEROBIC AND ANAEROBIC Blood Culture results may not be optimal due to an inadequate volume  of blood received in culture bottles Performed at [facility] [address], [place], [place] [address]    Culture   Final    NO GROWTH 3 DAYS Performed at [facility], [place] [address], [place], [place] [address]    Report Status PENDING  Incomplete  Blood Culture (routine x 2)     Status: None (Preliminary result)   Collection Time: 04/17/19  8:58 PM   Specimen: BLOOD LEFT HAND  Result Value Ref Range Status   Specimen Description   Final    BLOOD LEFT HAND Performed at [facility] [address] [place], [place] [address]    Special Requests   Final    BOTTLES DRAWN AEROBIC AND ANAEROBIC Blood Culture adequate volume Performed at [facility] [address], [place], [place] [address]    Culture   Final    NO GROWTH 3 DAYS Performed at [facility], [place] [address] [place], [place] [address]    Report Status PENDING  Incomplete  SARS CORONAVIRUS 2 (TAT 6-24 HRS) Nasopharyngeal Nasopharyngeal Swab     Status: Abnormal   Collection Time: 04/17/19 10:07 PM   Specimen: Nasopharyngeal Swab  Result Value Ref Range Status   SARS Coronavirus 2 POSITIVE (A) NEGATIVE Final    Comment: RESULT CALLED TO, READ BACK BY AND VERIFIED WITH: ARDAE LEA RN._0  ON 12.12.2020 BY TCALDWELL MT. (NOTE) SARS-CoV-2 target nucleic acids are DETECTED. The SARS-CoV-2 RNA is generally detectable in upper and lower respiratory specimens during the acute phase of infection. Positive results are indicative of the presence of SARS-CoV-2 RNA. Clinical correlation with patient history and other diagnostic information is  necessary to determine patient infection status. Positive results do not rule out bacterial infection or co-infection with other viruses.  The expected result is Negative. Fact Sheet for Patients: SugarRoll.be Fact Sheet for Healthcare  Providers: https://www.woods-mathews.com/ This test is not yet approved or cleared by the [place] FDA and  has been authorized for detection and/or diagnosis of SARS-CoV-2 by FDA under an Emergency Use Authorization (EUA). This EUA will remain  in effect (meaning this test can be  used) for the duration of the COVID-19 declaration under Section 564(b)(1) of  the Act, 21 U.S.C. section 360bbb-3(b)(1), unless the authorization is terminated or revoked sooner. Performed at [facility], [place] [address], [place], [place] [address]      Labs: BNP (last 3 results) No results for input(s): BNP in the last 8760 hours. Basic Metabolic Panel: Recent Labs  Lab 04/17/19 2057 04/18/19 0323 04/19/19 0354 04/20/19 0407 04/21/19 0407  NA 140 140 142 140 139  K 3.9 3.8 3.5 3.2* 3.8  CL 108 110 110 107 106  CO2 24 18* _0 GLUCOSE 143* 191* 120* 142* 125*  BUN _1 CREATININE 0.72 0.45 0.58 0.51 0.54  CALCIUM 9.7 9.5 9.4 9.5 9.4  MG  --  2.2 2.1 2.0 2.0  PHOS  --  2.5 3.8 3.6 3.6   Liver Function Tests: Recent Labs  Lab 04/17/19 2057 04/18/19 0323 04/19/19 0354 04/20/19 0407 04/21/19 0407  AST 44* 36 57* 64* 83*  ALT 156* 125* 108* 115* 132*  ALKPHOS 257* 226* 228* 261* 284*  BILITOT 0.6 0.9 0.4 0.2* 0.5  PROT 8.1 7.0 6.3* 6.6 6.4*  ALBUMIN 4.3 3.8 3.3* 3.5 3.5   No results for input(s): LIPASE, AMYLASE in the last 168 hours. No results for input(s): AMMONIA in the last 168 hours. CBC: Recent Labs  Lab 04/17/19 1509 04/18/19 0323 04/19/19 0354 04/20/19 0407 04/21/19 0407  WBC 4.6 3.2* 5.3 5.0 5.3  NEUTROABS 3.1 2.7 3.4 3.5 3.2  HGB 13.6 12.3 10.9* 11.2* 11.3*  HCT 41.1 37.1 32.9* 34.0* 34.4*  MCV 95.6 94.6 95.4 96.0 95.3  PLT 352 322 286 291 326   Cardiac Enzymes: No results for input(s): CKTOTAL, CKMB, CKMBINDEX, TROPONINI in the last 168 hours. BNP: Invalid input(s): POCBNP CBG: No results for input(s): GLUCAP in the last  168 hours. D-Dimer Recent Labs    04/20/19 0407 04/21/19 0407  DDIMER <0.27 0.28   Hgb A1c No results for input(s): HGBA1C in the last 72 hours. Lipid Profile No results for input(s): CHOL, HDL, LDLCALC, TRIG, CHOLHDL, LDLDIRECT in the last 72 hours. Thyroid function studies No results for input(s): TSH, T4TOTAL, T3FREE, THYROIDAB in the last 72 hours.  Invalid input(s): FREET3 Anemia work up Recent Labs    04/20/19 0407 04/21/19 0407  FERRITIN 136 282   Urinalysis No results found for: COLORURINE, APPEARANCEUR, LABSPEC, PHURINE, GLUCOSEU, HGBUR, BILIRUBINUR, KETONESUR, PROTEINUR, UROBILINOGEN, NITRITE, LEUKOCYTESUR Sepsis Labs Invalid input(s): PROCALCITONIN,  WBC,  LACTICIDVEN   Time coordinating discharge: 35 minutes  SIGNED:  Mercy Riding, MD  [facility] 04/21/2019, 3:11 PM  If 7PM-7AM, please contact night-coverage [email or web address] Password [email or web address]

## 2019-04-21 NOTE — Plan of Care (Signed)
Pt discharged today per [name] MD. Pt's IV site D/C'd and site WDL. PT VSS. Pt provided with home medication lost, discharge instructions and prescriptions. Verbalizes understanding. Pt left floor via WC in stable condition accompained by [name].

## 2019-04-21 NOTE — Care Management Important Message (Signed)
Important Message  Patient Details IM Letter given to Gabriel Earing RN Case Manager to present to the Patient Name: Lisa [name] MRN: [id number] Date of Birth: November 15, 1970   Medicare Important Message Given:  Yes     Kerin Salen 04/21/2019, 10:17 AM

## 2019-04-23 LAB — CULTURE, BLOOD (ROUTINE X 2)
Culture: NO GROWTH
Culture: NO GROWTH
Special Requests: ADEQUATE

## 2019-04-23 NOTE — Progress Notes (Unsigned)
Received a page from [phone].  When I called back, patient answered the phone call stating that him his breathing is worse and she is also having persistent "stomach pain".  She says it oxygen saturation is about 90.  She said she had stomach pain on and off while in the hospital, which is not persistent.  She denies nausea or vomiting.  She denies chest pain or fever.   She does not seem to be in distress when she talks to me over the phone.  She speaks in full sentences. I suggested going to the nearest emergency department or calling 911 if she continues to have pain and worsening shortness of breath.  Patient voiced understanding and agreed.

## 2019-04-28 DIAGNOSIS — Z6820 Body mass index (BMI) 20.0-20.9, adult: Secondary | ICD-10-CM | POA: Diagnosis not present

## 2019-04-28 DIAGNOSIS — R7401 Elevation of levels of liver transaminase levels: Secondary | ICD-10-CM | POA: Diagnosis not present

## 2019-04-28 DIAGNOSIS — Z8619 Personal history of other infectious and parasitic diseases: Secondary | ICD-10-CM | POA: Diagnosis not present

## 2019-05-11 ENCOUNTER — Telehealth: Payer: Self-pay

## 2019-05-11 DIAGNOSIS — M5412 Radiculopathy, cervical region: Secondary | ICD-10-CM | POA: Diagnosis not present

## 2019-05-11 DIAGNOSIS — M961 Postlaminectomy syndrome, not elsewhere classified: Secondary | ICD-10-CM | POA: Diagnosis not present

## 2019-05-11 DIAGNOSIS — M5136 Other intervertebral disc degeneration, lumbar region: Secondary | ICD-10-CM | POA: Diagnosis not present

## 2019-05-11 DIAGNOSIS — G894 Chronic pain syndrome: Secondary | ICD-10-CM | POA: Diagnosis not present

## 2019-05-11 NOTE — Telephone Encounter (Signed)
Left a voice mail for patient to call back.

## 2019-05-12 ENCOUNTER — Other Ambulatory Visit: Payer: Self-pay

## 2019-05-12 ENCOUNTER — Telehealth (INDEPENDENT_AMBULATORY_CARE_PROVIDER_SITE_OTHER): Payer: PPO | Admitting: Gastroenterology

## 2019-05-12 VITALS — Ht 67.0 in | Wt 130.0 lb

## 2019-05-12 DIAGNOSIS — Z9049 Acquired absence of other specified parts of digestive tract: Secondary | ICD-10-CM | POA: Diagnosis not present

## 2019-05-12 DIAGNOSIS — Z09 Encounter for follow-up examination after completed treatment for conditions other than malignant neoplasm: Secondary | ICD-10-CM

## 2019-05-12 DIAGNOSIS — K580 Irritable bowel syndrome with diarrhea: Secondary | ICD-10-CM

## 2019-05-12 DIAGNOSIS — Z8616 Personal history of COVID-19: Secondary | ICD-10-CM | POA: Diagnosis not present

## 2019-05-12 DIAGNOSIS — R945 Abnormal results of liver function studies: Secondary | ICD-10-CM | POA: Diagnosis not present

## 2019-05-12 DIAGNOSIS — R7989 Other specified abnormal findings of blood chemistry: Secondary | ICD-10-CM

## 2019-05-12 DIAGNOSIS — Z800 Family history of malignant neoplasm of digestive organs: Secondary | ICD-10-CM | POA: Diagnosis not present

## 2019-05-12 NOTE — Patient Instructions (Signed)
If you are age 50 or older, your body mass index should be between 23-30. Your Body mass index is 20.36 kg/m. If this is out of the aforementioned range listed, please consider follow up with your Primary Care Provider.  If you are age 33 or younger, your body mass index should be between 19-25. Your Body mass index is 20.36 kg/m. If this is out of the aformentioned range listed, please consider follow up with your Primary Care Provider.   TUMS as needed.   Pepcid 20 mg daily as needed.   Follow up in 6 months.   Thank you,  Dr. [name]

## 2019-05-12 NOTE — Progress Notes (Signed)
Chief Complaint: FU  Referring Provider:  Mateo Flow, MD  ,     ASSESSMENT AND PLAN;   #1.  Covid 19 pneumonia with abn LFTs (d/t Covid-19 or remdesivir). Neg [place] 04/2019  #2.  IBS with predominant diarrhea.  Better.  #3.  FH of colon cancer (mom at age 50).  Neg colon 08/2016.  #4.  EoE (on EGD 02/2017), neg SB Bx for celiac.  Asymptomatic.  Plan: - Blood test results from Dr Laurelyn Sickle office. -TUMS prn - Off Dexilant after COVID-19. - Pepcid 77m po qd prn - FU in 6 months.  HPI:    Lisa GAETANOis a 49y.o. female  [facility]t COVID-19 pneumonia requiring oxygen.  Treated with steroids/oxygen/did not require intubation.  Started having increasing liver function tests.  Remdesivir was stopped.  Ultrasound abdomen neg.   She has done well since discharge.  [facility]  Still with elevated liver tests but they are coming down well.  We do not have the the latest LFTs.  No itching, skin lesions, easy bruisability, intake of over-the-counter medications including diet pills, herbal medications, anabolic steroids.  She has stopped pain medications with Tylenol. No blood transfusions, IV drug use or family history of liver disease.  No jaundice dark urine or pale stools.  No history of alcohol abuse.  Acute hepatitis panel was negative.  Note that she is S/P cholecystectomy.  Eating much better.  There was no loss of taste or smell.  No anorexia.  Lost weight while in the hospital which she has gained it back.  No nausea, vomiting, heartburn, regurgitation, odynophagia or dysphagia.  No significant diarrhea or constipation.  No melena or hematochezia. No abdominal pain.  Husband JDellis Filbertdid not have COVID-19.  Unfortunately, Lisa [name]'s mom had COVID-19.  Has recovered.   Past Medical History:  Diagnosis Date  . Anxiety   . Chronic back pain   . Chronic constipation   .  Depression   . Eosinophilic esophagitis   . FH: colon cancer   . GERD (gastroesophageal reflux disease)   . Hiatal hernia   . Hx of Clostridium difficile infection   . IBS (irritable bowel syndrome)     Past Surgical History:  Procedure Laterality Date  . BACK SURGERY    . CHOLECYSTECTOMY    . COLONOSCOPY  08/17/2016   Internal hemorrhoids.   . ESOPHAGOGASTRODUODENOSCOPY  03/01/2017   Gastritis.   .Marland KitchenPLACEMENT OF BREAST IMPLANTS      Family History  Problem Relation Age of Onset  . Colon cancer Mother        took 3 feet of colon   . Rheumatic fever Mother        age 50-13.5 . Heart attack Maternal Aunt   . Stomach cancer Maternal Aunt   . Liver cancer Maternal Aunt   . Heart attack Maternal Uncle   . Lung cancer Maternal Uncle   . Kidney cancer Maternal Uncle   . Leukemia Paternal Aunt   . Alcoholism Father   . Alcoholism Brother   . Hypertension Brother   . Esophageal cancer Neg Hx     Social History   Tobacco Use  . Smoking status: Former SResearch scientist (life sciences) . Smokeless tobacco: Never Used  . Tobacco comment: quit 15 years ago like 2004  Substance Use Topics  . Alcohol use: Never  . Drug use:  Never    Current Outpatient Medications  Medication Sig Dispense Refill  . Calcium Carbonate Antacid (TUMS PO) Take 1 tablet by mouth as needed.    Marland Kitchen dexlansoprazole (DEXILANT) 60 MG capsule Take 1 capsule (60 mg total) by mouth daily. (Patient not taking: Reported on 05/11/2019) 30 capsule 6  . dextromethorphan-guaiFENesin (MUCINEX DM) 30-600 MG 12hr tablet Take 1 tablet by mouth 2 (two) times daily. 30 tablet 0  . diclofenac (VOLTAREN) 75 MG EC tablet TAKE 1 TABLET(75 MG) BY MOUTH TWICE DAILY (Patient not taking: No sig reported) 180 tablet 0  . famotidine (PEPCID) 40 MG tablet Take 1 tablet (40 mg total) by mouth daily. (Patient not taking: Reported on 05/11/2019) 30 tablet 11  . FLOVENT HFA 110 MCG/ACT inhaler Inhale 2 puffs into the lungs daily as needed (shortness of breath).   3   . HYDROcodone-acetaminophen (NORCO) 10-325 MG tablet Take 1 tablet by mouth every 4 (four) hours as needed.     . Melatonin 10 MG CAPS Take 30 mg by mouth at bedtime.     . methocarbamol (ROBAXIN) 750 MG tablet Take 750 mg by mouth 3 (three) times daily.     Marland Kitchen morphine (MS CONTIN) 30 MG 12 hr tablet Take 30 mg by mouth 3 (three) times daily.     . ondansetron (ZOFRAN-ODT) 4 MG disintegrating tablet Take 4 mg by mouth every 6 (six) hours as needed for nausea/vomiting.    . pentosan polysulfate (ELMIRON) 100 MG capsule Take 100 mg by mouth as needed (IC flareup).     . polyethylene glycol (MIRALAX / GLYCOLAX) packet Take 17 g by mouth as needed.     . rizatriptan (MAXALT) 10 MG tablet 1 tablet as needed.    . senna-docusate (SENOKOT-S) 8.6-50 MG tablet Take 1 tablet by mouth 2 (two) times daily as needed for moderate constipation. (Patient not taking: Reported on 05/11/2019) 60 tablet 0  . zonisamide (ZONEGRAN) 25 MG capsule Take 25 mg by mouth as needed.      Current Facility-Administered Medications  Medication Dose Route Frequency Provider Last Rate Last Admin  . triamcinolone acetonide (KENALOG) 10 MG/ML injection 10 mg  10 mg Other Once Landis Martins, DPM        Allergies  Allergen Reactions  . Azithromycin Other (See Comments)    Unknown   . Latex Other (See Comments)    Unknown   . Metaxalone Itching  . Shellfish Allergy     Review of Systems:  Negative except for HPI     Physical Exam:    Ht _0  (1.702 m)   Wt 130 lb (59 kg)   BMI 20.36 kg/m  Filed Weights   05/12/19 0817  Weight: 130 lb (59 kg)   Televisit Data Reviewed: I have personally reviewed following labs and imaging studies  CBC Latest Ref Rng & Units 04/21/2019 04/20/2019 04/19/2019  WBC 4.0 - 10.5 K/uL 5.3 5.0 5.3  Hemoglobin 12.0 - 15.0 g/dL 11.3(L) 11.2(L) 10.9(L)  Hematocrit 36.0 - 46.0 % 34.4(L) 34.0(L) 32.9(L)  Platelets 150 - 400 K/uL 326 291 286   Hepatic Function Latest Ref Rng & Units  04/21/2019 04/20/2019 04/19/2019  Total Protein 6.5 - 8.1 g/dL 6.4(L) 6.6 6.3(L)  Albumin 3.5 - 5.0 g/dL 3.5 3.5 3.3(L)  AST 15 - 41 U/L 83(H) 64(H) 57(H)  ALT 0 - 44 U/L 132(H) 115(H) 108(H)  Alk Phosphatase 38 - 126 U/L 284(H) 261(H) 228(H)  Total Bilirubin 0.3 - 1.2 mg/dL 0.5 0.2(L)  0.4   I connected with  Lisa [name] on 05/12/19 by a video enabled telemedicine application and verified that I am speaking with the correct person using two identifiers.   I discussed the limitations of evaluation and management by telemedicine. The patient expressed understanding and agreed to proceed.  Time spent on call/review of records: 25 min   Lisa Austria, MD 05/12/2019, 1:33 PM  Cc: Mateo Flow, MD

## 2019-05-19 DIAGNOSIS — R197 Diarrhea, unspecified: Secondary | ICD-10-CM | POA: Diagnosis not present

## 2019-05-19 DIAGNOSIS — R1013 Epigastric pain: Secondary | ICD-10-CM | POA: Diagnosis not present

## 2019-06-06 DIAGNOSIS — K219 Gastro-esophageal reflux disease without esophagitis: Secondary | ICD-10-CM | POA: Diagnosis not present

## 2019-06-06 DIAGNOSIS — J309 Allergic rhinitis, unspecified: Secondary | ICD-10-CM | POA: Diagnosis not present

## 2019-06-06 DIAGNOSIS — F339 Major depressive disorder, recurrent, unspecified: Secondary | ICD-10-CM | POA: Diagnosis not present

## 2019-07-06 DIAGNOSIS — M5136 Other intervertebral disc degeneration, lumbar region: Secondary | ICD-10-CM | POA: Diagnosis not present

## 2019-07-06 DIAGNOSIS — M5412 Radiculopathy, cervical region: Secondary | ICD-10-CM | POA: Diagnosis not present

## 2019-07-06 DIAGNOSIS — G894 Chronic pain syndrome: Secondary | ICD-10-CM | POA: Diagnosis not present

## 2019-07-06 DIAGNOSIS — M961 Postlaminectomy syndrome, not elsewhere classified: Secondary | ICD-10-CM | POA: Diagnosis not present

## 2019-07-14 DIAGNOSIS — Z6822 Body mass index (BMI) 22.0-22.9, adult: Secondary | ICD-10-CM | POA: Diagnosis not present

## 2019-07-14 DIAGNOSIS — R109 Unspecified abdominal pain: Secondary | ICD-10-CM | POA: Diagnosis not present

## 2019-07-20 NOTE — Progress Notes (Deleted)
Cardiology Office Note:    Date:  07/20/2019   ID:  Lisa [name], DOB 02/10/1971, MRN [id number]  PCP:  Mateo Flow, MD  Cardiologist:  Shirlee More, MD   Referring MD: Mateo Flow, MD  ASSESSMENT:    No diagnosis found. PLAN:    In order of problems listed above:  1. ***  Next appointment   Medication Adjustments/Labs and Tests Ordered: Current medicines are reviewed at length with the patient today.  Concerns regarding medicines are outlined above.  No orders of the defined types were placed in this encounter.  No orders of the defined types were placed in this encounter.    No chief complaint on file. ***  History of Present Illness:    Lisa [name] is a 49 y.o. female who is being seen today for the evaluation of *** at the request of Mateo Flow, MD.  COVID-19 pneumonia admitted 04/17/2019 04/21/2019 [place].  She was admitted to the hospital because of hypoxemia and was treated with remdesivir and Decadron with improvement in breathing and oxygenation.  Her discharge summary also relates that she had atypical musculoskeletal chest pain reproducible to palpation associated with movement and her high-sensitivity troponins were normal. Her EKG from 04/16/2019 showed sinus rhythm nonspecific ST and T abnormality.  CT angio of the chest 04/17/2019 showed normal cardiovascular structures there is no vascular calcification and there is patchy peripheral airspace disease bilaterally most consistent with viral pneumonia  Past Medical History:  Diagnosis Date  . Anxiety   . Chronic back pain   . Chronic constipation   . Depression   . Eosinophilic esophagitis   . FH: colon cancer   . GERD (gastroesophageal reflux disease)   . Hiatal hernia   . Hx of Clostridium difficile infection   . IBS (irritable bowel syndrome)     Past Surgical History:  Procedure Laterality Date  . BACK SURGERY    . CHOLECYSTECTOMY    . COLONOSCOPY  08/17/2016   Internal hemorrhoids.   . ESOPHAGOGASTRODUODENOSCOPY  03/01/2017   Gastritis.   Marland Kitchen PLACEMENT OF BREAST IMPLANTS      Current Medications: No outpatient medications have been marked as taking for the 07/21/19 encounter (Appointment) with Richardo Priest, MD.   Current Facility-Administered Medications for the 07/21/19 encounter (Appointment) with Richardo Priest, MD  Medication  . triamcinolone acetonide (KENALOG) 10 MG/ML injection 10 mg     Allergies:   Azithromycin, Latex, Metaxalone, and Shellfish allergy   Social History   Socioeconomic History  . Marital status: Married    Spouse name: Not on file  . Number of children: 0  . Years of education: Not on file  . Highest education level: Not on file  Occupational History  . Not on file  Tobacco Use  . Smoking status: Former Research scientist (life sciences)  . Smokeless tobacco: Never Used  . Tobacco comment: quit 15 years ago like 2004  Substance and Sexual Activity  . Alcohol use: Never  . Drug use: Never  . Sexual activity: Not on file  Other Topics Concern  . Not on file  Social History Narrative  . Not on file   Social Determinants of Health   Financial Resource Strain:   . Difficulty of Paying Living Expenses:   Food Insecurity:   . Worried About Charity fundraiser in the Last Year:   . Arboriculturist in the Last Year:   Transportation Needs:   .  Lack of Transportation (Medical):   Marland Kitchen Lack of Transportation (Non-Medical):   Physical Activity:   . Days of Exercise per Week:   . Minutes of Exercise per Session:   Stress:   . Feeling of Stress :   Social Connections:   . Frequency of Communication with Friends and Family:   . Frequency of Social Gatherings with Friends and Family:   . Attends Religious Services:   . Active Member of Clubs or Organizations:   . Attends Archivist Meetings:   Marland Kitchen Marital Status:      Family History: The patient's ***family history includes Alcoholism in her brother and father; Colon  cancer in her mother; Heart attack in her maternal aunt and maternal uncle; Hypertension in her brother; Kidney cancer in her maternal uncle; Leukemia in her paternal aunt; Liver cancer in her maternal aunt; Lung cancer in her maternal uncle; Rheumatic fever in her mother; Stomach cancer in her maternal aunt. There is no history of Esophageal cancer.  ROS:   ROS Please see the history of present illness.    *** All other systems reviewed and are negative.  EKGs/Labs/Other Studies Reviewed:    The following studies were reviewed today: ***  EKG:  EKG is *** ordered today.  The ekg ordered today is personally reviewed and demonstrates ***  Recent Labs: 04/21/2019: ALT 132; BUN 12; Creatinine, Ser 0.54; Hemoglobin 11.3; Magnesium 2.0; Platelets 326; Potassium 3.8; Sodium 139  Recent Lipid Panel    Component Value Date/Time   TRIG 69 04/17/2019 2057    Physical Exam:    VS:  There were no vitals taken for this visit.    Wt Readings from Last 3 Encounters:  05/12/19 130 lb (59 kg)  04/17/19 123 lb 1.6 oz (55.8 kg)  01/02/19 130 lb (59 kg)     GEN: *** Well nourished, well developed in no acute distress HEENT: Normal NECK: No JVD; No carotid bruits LYMPHATICS: No lymphadenopathy CARDIAC: ***RRR, no murmurs, rubs, gallops RESPIRATORY:  Clear to auscultation without rales, wheezing or rhonchi  ABDOMEN: Soft, non-tender, non-distended MUSCULOSKELETAL:  No edema; No deformity  SKIN: Warm and dry NEUROLOGIC:  Alert and oriented x 3 PSYCHIATRIC:  Normal affect     Signed, Shirlee More, MD  07/20/2019 8:21 AM    [facility]

## 2019-07-21 ENCOUNTER — Ambulatory Visit: Payer: PPO | Admitting: Cardiology

## 2019-08-21 ENCOUNTER — Other Ambulatory Visit: Payer: Self-pay

## 2019-08-24 DIAGNOSIS — M961 Postlaminectomy syndrome, not elsewhere classified: Secondary | ICD-10-CM | POA: Diagnosis not present

## 2019-08-24 DIAGNOSIS — M5412 Radiculopathy, cervical region: Secondary | ICD-10-CM | POA: Diagnosis not present

## 2019-08-24 DIAGNOSIS — Z79899 Other long term (current) drug therapy: Secondary | ICD-10-CM | POA: Diagnosis not present

## 2019-08-24 DIAGNOSIS — M533 Sacrococcygeal disorders, not elsewhere classified: Secondary | ICD-10-CM | POA: Diagnosis not present

## 2019-08-24 DIAGNOSIS — G894 Chronic pain syndrome: Secondary | ICD-10-CM | POA: Diagnosis not present

## 2019-08-24 NOTE — Progress Notes (Signed)
Cardiology Office Note:    Date:  08/25/2019   ID:  Lisa [name], DOB [birth year], MRN [id number]  PCP:  Mateo Flow, MD  Cardiologist:  Shirlee More, MD    Referring MD: Mateo Flow, MD    ASSESSMENT:    1. Shortness of breath   2. Nonspecific abnormal electrocardiogram (ECG) (EKG)   3. Palpitations    PLAN:    In order of problems listed above:  This became a more complicated visit than expected.  Initially I was unaware that she had been hospitalized with COVID-19.  Her problem is persistent shortness of breath afterwards and will do echocardiogram to assess for cardiomyopathy and repeat an office EKG today.  She has had palpitation in the past took a beta-blocker done well requested prescription of short acting to take as needed at this time declines an extended cardiac monitor.  I asked her to come back and see me in the office in 4 to 6 weeks to assess her response.  Clinically I suspect she has ongoing symptoms related to pulmonary injury from her lung infection.  Next appointment: 6 weeks   Medication Adjustments/Labs and Tests Ordered: Current medicines are reviewed at length with the patient today.  Concerns regarding medicines are outlined above.  No orders of the defined types were placed in this encounter.  No orders of the defined types were placed in this encounter.   No chief complaint on file.   History of Present Illness:    Lisa [name] is a 49 y.o. female with a family history of CAD and a hx of previous chest pain evaluation 2004 and again in 2018 by my previous practice [facility] regional cardiology.  Last seen 03/04/2017 by Dr. Prentice Docker [facility].  He also has a history of gastritis and esophagitis is a former smoker she had a stress echocardiogram performed [facility] 03/18/2017, unfortunately I cannot see the results in Care Everywhere  Compliance with diet, lifestyle and medications: Yes  She was hospitalized COVID-19  December CT of the chest 04/17/2019 showed normal cardiac contour no pulmonary embolism and bilateral infiltrates consistent with viral pneumonia.  EKG 1211 showed sinus rhythm Marked repolarization changes ischemia.  She thinks her cardiac ultrasound was done I cannot find a report did not have cardiac enzymes drawn  She has never fully recovered she finds that she has brain fog fatigue and she is short of breath with any activities even bathing dressing and bending over she says that her x-rays are improving.  She has no cough or wheezing no edema orthopnea no chest pain or syncope.  She still has intermittent palpitation or rapid heart rate declines a moderate requested prescription for as needed beta-blocker that she is taking previously.  For further evaluation echocardiogram looking for evidence of cardiomyopathy.  Also recheck an EKG in our office today Past Medical History:  Diagnosis Date  . Acute respiratory disease due to COVID-19 virus 04/17/2019  . Anxiety   . Bursitis 07/12/2015  . Chronic back pain   . Chronic constipation   . Chronic interstitial cystitis 11/12/2016  . DDD (degenerative disc disease), cervical 01/02/2019   MRI from February 01, 2017 showed mild degenerative changes.  . DDD (degenerative disc disease), lumbar 01/02/2019   Status post fusion in 2009 and 2010  . Depression   . Eosinophilic esophagitis   . FH: colon cancer   . GERD (gastroesophageal reflux disease)   . Hiatal hernia   .  History of Clostridioides difficile infection 01/02/2019  . History of endometriosis 01/02/2019  . History of gastroesophageal reflux (GERD) 01/02/2019  . History of migraine 01/02/2019  . Hx of Clostridium difficile infection   . IBS (irritable bowel syndrome)   . Interstitial cystitis 01/02/2019  . Major depressive disorder, single episode, unspecified 06/09/2013  . Myofascial pain 06/04/2018  . Post laminectomy syndrome 10/09/2013  . Primary osteoarthritis of both hands 01/02/2019    Clinical and radiographic findings are consistent with osteoarthritis.  All autoimmune work-up was negative.  Use of Voltaren gel and natural anti-inflammatorie  . SI (sacroiliac) pain 09/09/2015    Past Surgical History:  Procedure Laterality Date  . BACK SURGERY    . CHOLECYSTECTOMY    . COLONOSCOPY  08/17/2016   Internal hemorrhoids.   . ESOPHAGOGASTRODUODENOSCOPY  03/01/2017   Gastritis.   Marland Kitchen PLACEMENT OF BREAST IMPLANTS      Current Medications: Current Meds  Medication Sig  . albuterol (VENTOLIN HFA) 108 (90 Base) MCG/ACT inhaler Inhale 2 puffs into the lungs every 4 (four) hours as needed.  . bimatoprost (LATISSE) 0.03 % ophthalmic solution Apply 1 drop to eye at bedtime.  . Calcium Carbonate Antacid (TUMS PO) Take 1 tablet by mouth as needed.  . COMBIVENT RESPIMAT 20-100 MCG/ACT AERS respimat Inhale 1 puff into the lungs every 6 (six) hours as needed.  . famotidine (PEPCID) 40 MG tablet Take 1 tablet (40 mg total) by mouth daily.  Marland Kitchen FLOVENT HFA 110 MCG/ACT inhaler Inhale 2 puffs into the lungs daily as needed (shortness of breath).   Marland Kitchen HYDROcodone-acetaminophen (NORCO) 10-325 MG tablet Take 1 tablet by mouth every 4 (four) hours as needed.   . Melatonin 10 MG CAPS Take 30 mg by mouth at bedtime.   . methocarbamol (ROBAXIN) 750 MG tablet Take 750 mg by mouth 3 (three) times daily.   Marland Kitchen morphine (MS CONTIN) 30 MG 12 hr tablet Take 30 mg by mouth 3 (three) times daily.   . pentosan polysulfate (ELMIRON) 100 MG capsule Take 100 mg by mouth as needed (IC flareup).   . polyethylene glycol (MIRALAX / GLYCOLAX) packet Take 17 g by mouth as needed.   . zonisamide (ZONEGRAN) 100 MG capsule Take 300 mg by mouth daily.  Marland Kitchen zonisamide (ZONEGRAN) 25 MG capsule Take 25 mg by mouth as needed.    Current Facility-Administered Medications for the 08/25/19 encounter (Office Visit) with Richardo Priest, MD  Medication  . triamcinolone acetonide (KENALOG) 10 MG/ML injection 10 mg     Allergies:    Azithromycin, Latex, Metaxalone, and Shellfish allergy   Social History   Socioeconomic History  . Marital status: Married    Spouse name: Not on file  . Number of children: 0  . Years of education: Not on file  . Highest education level: Not on file  Occupational History  . Not on file  Tobacco Use  . Smoking status: Former Research scientist (life sciences)  . Smokeless tobacco: Never Used  . Tobacco comment: quit 15 years ago like 2004  Substance and Sexual Activity  . Alcohol use: Never  . Drug use: Never  . Sexual activity: Not on file  Other Topics Concern  . Not on file  Social History Narrative  . Not on file   Social Determinants of Health   Financial Resource Strain:   . Difficulty of Paying Living Expenses:   Food Insecurity:   . Worried About Charity fundraiser in the Last Year:   . Ran  Out of Food in the Last Year:   Transportation Needs:   . Lack of Transportation (Medical):   Marland Kitchen Lack of Transportation (Non-Medical):   Physical Activity:   . Days of Exercise per Week:   . Minutes of Exercise per Session:   Stress:   . Feeling of Stress :   Social Connections:   . Frequency of Communication with Friends and Family:   . Frequency of Social Gatherings with Friends and Family:   . Attends Religious Services:   . Active Member of Clubs or Organizations:   . Attends Archivist Meetings:   Marland Kitchen Marital Status:      Family History: The patient's family history includes Alcoholism in her brother and father; Colon cancer in her mother; Heart attack in her maternal aunt and maternal uncle; Hypertension in her brother; Kidney cancer in her maternal uncle; Leukemia in her paternal aunt; Liver cancer in her maternal aunt; Lung cancer in her maternal uncle; Rheumatic fever in her mother; Stomach cancer in her maternal aunt. There is no history of Esophageal cancer. ROS:   Please see the history of present illness.    All other systems reviewed and are negative.  EKGs/Labs/Other  Studies Reviewed:    The following studies were reviewed today:  EKG:  EKG ordered today and personally reviewed.  The ekg ordered today demonstrates sinus rhythm minor nonspecific ST changes near normal and markedly improved from the EKG done in hospital with acute COVID-19 pneumonia  Recent Labs: 04/21/2019: ALT 132; BUN 12; Creatinine, Ser 0.54; Hemoglobin 11.3; Magnesium 2.0; Platelets 326; Potassium 3.8; Sodium 139  Recent Lipid Panel    Component Value Date/Time   TRIG 69 04/17/2019 2057    Physical Exam:    VS:  BP 112/68 (BP Location: Right Arm, Patient Position: Sitting, Cuff Size: Normal)   Pulse 81   Temp (!) 97.1 F (36.2 C)   Ht 5\' 7"  (1.702 m)   Wt 138 lb (62.6 kg)   SpO2 98%   BMI 21.61 kg/m     Wt Readings from Last 3 Encounters:  08/25/19 138 lb (62.6 kg)  05/12/19 130 lb (59 kg)  04/17/19 123 lb 1.6 oz (55.8 kg)     GEN: She does not appear ill well nourished, well developed in no acute distress HEENT: Normal NECK: No JVD; No carotid bruits LYMPHATICS: No lymphadenopathy CARDIAC: RRR, no murmurs, rubs, gallops RESPIRATORY:  Clear to auscultation without rales, wheezing or rhonchi  ABDOMEN: Soft, non-tender, non-distended MUSCULOSKELETAL:  No edema; No deformity  SKIN: Warm and dry NEUROLOGIC:  Alert and oriented x 3 PSYCHIATRIC:  Normal affect    Signed, Shirlee More, MD  08/25/2019 3:54 PM    [facility]

## 2019-08-25 ENCOUNTER — Other Ambulatory Visit: Payer: Self-pay

## 2019-08-25 ENCOUNTER — Ambulatory Visit (INDEPENDENT_AMBULATORY_CARE_PROVIDER_SITE_OTHER): Payer: PPO | Admitting: Cardiology

## 2019-08-25 ENCOUNTER — Encounter: Payer: Self-pay | Admitting: Cardiology

## 2019-08-25 VITALS — BP 112/68 | HR 81 | Temp 97.1°F | Ht 67.0 in | Wt 138.0 lb

## 2019-08-25 DIAGNOSIS — R0602 Shortness of breath: Secondary | ICD-10-CM

## 2019-08-25 DIAGNOSIS — R002 Palpitations: Secondary | ICD-10-CM

## 2019-08-25 DIAGNOSIS — R9431 Abnormal electrocardiogram [ECG] [EKG]: Secondary | ICD-10-CM | POA: Diagnosis not present

## 2019-08-25 MED ORDER — METOPROLOL TARTRATE 25 MG PO TABS: 25.0000 mg | ORAL_TABLET | Freq: Every day | ORAL | 3 refills | Status: DC | PRN

## 2019-08-25 NOTE — Patient Instructions (Signed)
Medication Instructions:  Your physician has recommended you make the following change in your medication:  START: Lopressor 25 mg take one tablet daily as needed for rapid heart rate.  *If you need a refill on your cardiac medications before your next appointment, please call your pharmacy*   Lab Work: None If you have labs (blood work) drawn today and your tests are completely normal, you will receive your results only by: Marland Kitchen MyChart Message (if you have MyChart) OR . A paper copy in the mail If you have any lab test that is abnormal or we need to change your treatment, we will call you to review the results.   Testing/Procedures: Your physician has requested that you have an echocardiogram. Echocardiography is a painless test that uses sound waves to create images of your heart. It provides your doctor with information about the size and shape of your heart and how well your heart's chambers and valves are working. This procedure takes approximately one hour. There are no restrictions for this procedure.     Follow-Up: At [facility], you and your health needs are our priority.  As part of our continuing mission to provide you with exceptional heart care, we have created designated Provider Care Teams.  These Care Teams include your primary Cardiologist (physician) and Advanced Practice Providers (APPs -  Physician Assistants and Nurse Practitioners) who all work together to provide you with the care you need, when you need it.  We recommend signing up for the patient portal called "MyChart".  Sign up information is provided on this After Visit Summary.  MyChart is used to connect with patients for Virtual Visits (Telemedicine).  Patients are able to view lab/test results, encounter notes, upcoming appointments, etc.  Non-urgent messages can be sent to your provider as well.   To learn more about what you can do with MyChart, go to [email or web address].    Your next appointment:    4-8 week(s)  The format for your next appointment:   In Person  Provider:   Shirlee More, MD   Other Instructions

## 2019-09-10 DIAGNOSIS — M5412 Radiculopathy, cervical region: Secondary | ICD-10-CM | POA: Diagnosis not present

## 2019-09-17 ENCOUNTER — Ambulatory Visit (INDEPENDENT_AMBULATORY_CARE_PROVIDER_SITE_OTHER): Payer: PPO

## 2019-09-17 ENCOUNTER — Other Ambulatory Visit: Payer: Self-pay

## 2019-09-17 DIAGNOSIS — R9431 Abnormal electrocardiogram [ECG] [EKG]: Secondary | ICD-10-CM

## 2019-09-17 DIAGNOSIS — R002 Palpitations: Secondary | ICD-10-CM

## 2019-09-17 NOTE — Progress Notes (Signed)
Complete echocardiogram has been performed.  Jimmy Jurell Basista RDCS, RVT 

## 2019-09-18 NOTE — Progress Notes (Signed)
Patient called.     Pt notified of normal Echo.   Berniece Salines, DO  09/18/2019 12:35 PM EDT  Normal echocardiogram

## 2019-09-28 NOTE — Progress Notes (Signed)
Cardiology Office Note:    Date:  09/29/2019   ID:  Lisa [name], DOB 03-15-1971, MRN [id number]  PCP:  Mateo Flow, MD  Cardiologist:  Shirlee More, MD    Referring MD: Mateo Flow, MD    ASSESSMENT:    1. Shortness of breath   2. Nonspecific abnormal electrocardiogram (ECG) (EKG)    PLAN:    In order of problems listed above:  1. Shortness of breath nonspecific EKG changes unrelated to COVID-19 cardiac involvement.  She is reassured I asked her to start a regular exercise program most likely deconditioning is the primary problem should respond I will plan to see him back in the office as needed.  I do not think she requires additional evaluation such as MR or ischemic test.   Next appointment: As needed   Medication Adjustments/Labs and Tests Ordered: Current medicines are reviewed at length with the patient today.  Concerns regarding medicines are outlined above.  No orders of the defined types were placed in this encounter.  No orders of the defined types were placed in this encounter.   Chief Complaint  Patient presents with  . Follow-up    4-8 WK FU   . Shortness of Breath    After cardiacecho    History of Present Illness:    Lisa [name] is a 49 y.o. female with a hx of SOB following severe COVID 19 pneumonia last seen 08/25/2019. Compliance with diet, lifestyle and medications: Yes  She is seen back in the office in follow-up and I reviewed her echocardiogram which was normal.  She is spontaneously improving and is less weak fatigued and short of breath Gave her instructions initiate home walking program using her treadmill starting to 1/2 mph 5 to 10 minutes increasing up to goal 20 to 30 minutes a day increasing more than 1 to 2 minutes every 1 to 2 weeks and ideally up to 3 mph and 20 to 30 minutes/day.  She is reassured.  She has an upcoming appointment with her primary care doctor and I told her require better chest x-ray.  Echo: 09/17/2019  1. Left ventricular ejection fraction, by estimation, is 60 to 65%. The  left ventricle has normal function. The left ventricle has no regional  wall motion abnormalities. Left ventricular diastolic parameters were  normal.   2. Right ventricular systolic function is normal. The right ventricular  size is normal. There is normal pulmonary artery systolic pressure.   3. The mitral valve is normal in structure. Trivial mitral valve  regurgitation. No evidence of mitral stenosis.   4. The aortic valve is normal in structure. Aortic valve regurgitation is  not visualized. No aortic stenosis is present.   5. The inferior vena cava is normal in size with greater than 50%  respiratory variability, suggesting right atrial pressure of 3 mmHg.   Past Medical History:  Diagnosis Date  . Acute respiratory disease due to COVID-19 virus 04/17/2019  . Anxiety   . Bursitis 07/12/2015  . Chronic back pain   . Chronic constipation   . Chronic interstitial cystitis 11/12/2016  . DDD (degenerative disc disease), cervical 01/02/2019   MRI from February 01, 2017 showed mild degenerative changes.  . DDD (degenerative disc disease), lumbar 01/02/2019   Status post fusion in 2009 and 2010  . Depression   . Eosinophilic esophagitis   . FH: colon cancer   . GERD (gastroesophageal reflux disease)   . Hiatal hernia   . History  of Clostridioides difficile infection 01/02/2019  . History of endometriosis 01/02/2019  . History of gastroesophageal reflux (GERD) 01/02/2019  . History of migraine 01/02/2019  . Hx of Clostridium difficile infection   . IBS (irritable bowel syndrome)   . Interstitial cystitis 01/02/2019  . Major depressive disorder, single episode, unspecified 06/09/2013  . Myofascial pain 06/04/2018  . Post laminectomy syndrome 10/09/2013  . Primary osteoarthritis of both hands 01/02/2019   Clinical and radiographic findings are consistent with osteoarthritis.  All autoimmune work-up was negative.  Use of  Voltaren gel and natural anti-inflammatorie  . SI (sacroiliac) pain 09/09/2015    Past Surgical History:  Procedure Laterality Date  . BACK SURGERY    . CHOLECYSTECTOMY    . COLONOSCOPY  08/17/2016   Internal hemorrhoids.   . ESOPHAGOGASTRODUODENOSCOPY  03/01/2017   Gastritis.   Marland Kitchen PLACEMENT OF BREAST IMPLANTS      Current Medications: Current Meds  Medication Sig  . albuterol (VENTOLIN HFA) 108 (90 Base) MCG/ACT inhaler Inhale 2 puffs into the lungs every 4 (four) hours as needed.  . bimatoprost (LATISSE) 0.03 % ophthalmic solution Apply 1 drop to eye at bedtime.  . Calcium Carbonate Antacid (TUMS PO) Take 1 tablet by mouth as needed.  . COMBIVENT RESPIMAT 20-100 MCG/ACT AERS respimat Inhale 1 puff into the lungs every 6 (six) hours as needed.  Marland Kitchen dextromethorphan-guaiFENesin (MUCINEX DM) 30-600 MG 12hr tablet Take 1 tablet by mouth 2 (two) times daily.  . diclofenac (VOLTAREN) 75 MG EC tablet TAKE 1 TABLET(75 MG) BY MOUTH TWICE DAILY  . famotidine (PEPCID) 40 MG tablet Take 1 tablet (40 mg total) by mouth daily.  Marland Kitchen FLOVENT HFA 110 MCG/ACT inhaler Inhale 2 puffs into the lungs daily as needed (shortness of breath).   Marland Kitchen HYDROcodone-acetaminophen (NORCO) 10-325 MG tablet Take 1 tablet by mouth every 4 (four) hours as needed.   . Melatonin 10 MG CAPS Take 30 mg by mouth at bedtime.   . methocarbamol (ROBAXIN) 750 MG tablet Take 750 mg by mouth 3 (three) times daily.   . metoprolol tartrate (LOPRESSOR) 25 MG tablet Take 1 tablet (25 mg total) by mouth daily as needed. For rapid heart rate.  . morphine (MS CONTIN) 30 MG 12 hr tablet Take 30 mg by mouth 3 (three) times daily.   . ondansetron (ZOFRAN-ODT) 4 MG disintegrating tablet Take 4 mg by mouth every 6 (six) hours as needed for nausea/vomiting.  . pentosan polysulfate (ELMIRON) 100 MG capsule Take 100 mg by mouth as needed (IC flareup).   . polyethylene glycol (MIRALAX / GLYCOLAX) packet Take 17 g by mouth as needed.   . rizatriptan  (MAXALT) 10 MG tablet 1 tablet as needed.  . senna-docusate (SENOKOT-S) 8.6-50 MG tablet Take 1 tablet by mouth 2 (two) times daily as needed for moderate constipation.  Marland Kitchen zonisamide (ZONEGRAN) 100 MG capsule Take 300 mg by mouth daily.  Marland Kitchen zonisamide (ZONEGRAN) 25 MG capsule Take 25 mg by mouth as needed.    Current Facility-Administered Medications for the 09/29/19 encounter (Office Visit) with Richardo Priest, MD  Medication  . triamcinolone acetonide (KENALOG) 10 MG/ML injection 10 mg     Allergies:   Azithromycin, Latex, Metaxalone, and Shellfish allergy   Social History   Socioeconomic History  . Marital status: Married    Spouse name: Not on file  . Number of children: 0  . Years of education: Not on file  . Highest education level: Not on file  Occupational History  .  Not on file  Tobacco Use  . Smoking status: Former Research scientist (life sciences)  . Smokeless tobacco: Never Used  . Tobacco comment: quit 15 years ago like 2004  Substance and Sexual Activity  . Alcohol use: Never  . Drug use: Never  . Sexual activity: Not on file  Other Topics Concern  . Not on file  Social History Narrative  . Not on file   Social Determinants of Health   Financial Resource Strain:   . Difficulty of Paying Living Expenses:   Food Insecurity:   . Worried About Charity fundraiser in the Last Year:   . Arboriculturist in the Last Year:   Transportation Needs:   . Film/video editor (Medical):   Marland Kitchen Lack of Transportation (Non-Medical):   Physical Activity:   . Days of Exercise per Week:   . Minutes of Exercise per Session:   Stress:   . Feeling of Stress :   Social Connections:   . Frequency of Communication with Friends and Family:   . Frequency of Social Gatherings with Friends and Family:   . Attends Religious Services:   . Active Member of Clubs or Organizations:   . Attends Archivist Meetings:   Marland Kitchen Marital Status:      Family History: The patient's family history includes  Alcoholism in her brother and father; Colon cancer in her mother; Heart attack in her maternal aunt and maternal uncle; Hypertension in her brother; Kidney cancer in her maternal uncle; Leukemia in her paternal aunt; Liver cancer in her maternal aunt; Lung cancer in her maternal uncle; Rheumatic fever in her mother; Stomach cancer in her maternal aunt. There is no history of Esophageal cancer. ROS:   Please see the history of present illness.    All other systems reviewed and are negative.  EKGs/Labs/Other Studies Reviewed:    The following studies were reviewed today:  EKG:  EKG 08/25/2019 showed sinus rhythm nonspecific T waves inferior malalignments  Recent Labs: 04/21/2019: ALT 132; BUN 12; Creatinine, Ser 0.54; Hemoglobin 11.3; Magnesium 2.0; Platelets 326; Potassium 3.8; Sodium 139  Recent Lipid Panel    Component Value Date/Time   TRIG 69 04/17/2019 2057    Physical Exam:    VS:  BP 116/78   Pulse 86   Ht 5\' 7"  (1.702 m)   Wt 136 lb (61.7 kg)   SpO2 97%   BMI 21.30 kg/m     Wt Readings from Last 3 Encounters:  09/29/19 136 lb (61.7 kg)  08/25/19 138 lb (62.6 kg)  05/12/19 130 lb (59 kg)     GEN:  Well nourished, well developed in no acute distress HEENT: Normal NECK: No JVD; No carotid bruits LYMPHATICS: No lymphadenopathy CARDIAC: RRR, no murmurs, rubs, gallops RESPIRATORY:  Clear to auscultation without rales, wheezing or rhonchi  ABDOMEN: Soft, non-tender, non-distended MUSCULOSKELETAL:  No edema; No deformity  SKIN: Warm and dry NEUROLOGIC:  Alert and oriented x 3 PSYCHIATRIC:  Normal affect    Signed, Shirlee More, MD  09/29/2019 2:57 PM    [facility]

## 2019-09-29 ENCOUNTER — Ambulatory Visit (INDEPENDENT_AMBULATORY_CARE_PROVIDER_SITE_OTHER): Payer: PPO | Admitting: Cardiology

## 2019-09-29 ENCOUNTER — Encounter: Payer: Self-pay | Admitting: Cardiology

## 2019-09-29 ENCOUNTER — Other Ambulatory Visit: Payer: Self-pay

## 2019-09-29 VITALS — BP 116/78 | HR 86 | Ht 67.0 in | Wt 136.0 lb

## 2019-09-29 DIAGNOSIS — R0602 Shortness of breath: Secondary | ICD-10-CM

## 2019-09-29 DIAGNOSIS — R9431 Abnormal electrocardiogram [ECG] [EKG]: Secondary | ICD-10-CM | POA: Diagnosis not present

## 2019-09-29 MED ORDER — METOPROLOL SUCCINATE ER 25 MG PO TB24: 25.0000 mg | ORAL_TABLET | Freq: Every day | ORAL | 3 refills | Status: DC

## 2019-09-29 NOTE — Patient Instructions (Signed)
Medication Instructions:  Your physician has recommended you make the following change in your medication:  START: Toprol XL 25 mg take one tablet by mouth daily.  *If you need a refill on your cardiac medications before your next appointment, please call your pharmacy*   Lab Work: None If you have labs (blood work) drawn today and your tests are completely normal, you will receive your results only by: Marland Kitchen MyChart Message (if you have MyChart) OR . A paper copy in the mail If you have any lab test that is abnormal or we need to change your treatment, we will call you to review the results.   Testing/Procedures: None   Follow-Up: At [facility], you and your health needs are our priority.  As part of our continuing mission to provide you with exceptional heart care, we have created designated Provider Care Teams.  These Care Teams include your primary Cardiologist (physician) and Advanced Practice Providers (APPs -  Physician Assistants and Nurse Practitioners) who all work together to provide you with the care you need, when you need it.  We recommend signing up for the patient portal called "MyChart".  Sign up information is provided on this After Visit Summary.  MyChart is used to connect with patients for Virtual Visits (Telemedicine).  Patients are able to view lab/test results, encounter notes, upcoming appointments, etc.  Non-urgent messages can be sent to your provider as well.   To learn more about what you can do with MyChart, go to [email or web address].    Your next appointment:   As needed The format for your next appointment:   In Person  Provider:   Shirlee More, MD   Other Instructions

## 2019-10-22 ENCOUNTER — Other Ambulatory Visit: Payer: Self-pay | Admitting: Gastroenterology

## 2019-11-02 ENCOUNTER — Other Ambulatory Visit: Payer: Self-pay | Admitting: Gastroenterology

## 2019-11-04 ENCOUNTER — Telehealth: Payer: Self-pay | Admitting: Gastroenterology

## 2019-11-04 ENCOUNTER — Other Ambulatory Visit: Payer: Self-pay | Admitting: Gastroenterology

## 2019-11-04 NOTE — Telephone Encounter (Signed)
Refill sent to pharmacy. Pt aware.

## 2019-11-23 DIAGNOSIS — M961 Postlaminectomy syndrome, not elsewhere classified: Secondary | ICD-10-CM | POA: Diagnosis not present

## 2019-11-23 DIAGNOSIS — M5136 Other intervertebral disc degeneration, lumbar region: Secondary | ICD-10-CM | POA: Diagnosis not present

## 2019-11-23 DIAGNOSIS — G894 Chronic pain syndrome: Secondary | ICD-10-CM | POA: Diagnosis not present

## 2019-11-23 DIAGNOSIS — Z79899 Other long term (current) drug therapy: Secondary | ICD-10-CM | POA: Diagnosis not present

## 2019-11-23 DIAGNOSIS — M5412 Radiculopathy, cervical region: Secondary | ICD-10-CM | POA: Diagnosis not present

## 2019-11-23 DIAGNOSIS — M7918 Myalgia, other site: Secondary | ICD-10-CM | POA: Diagnosis not present

## 2019-12-03 DIAGNOSIS — J309 Allergic rhinitis, unspecified: Secondary | ICD-10-CM | POA: Diagnosis not present

## 2019-12-03 DIAGNOSIS — R635 Abnormal weight gain: Secondary | ICD-10-CM | POA: Diagnosis not present

## 2019-12-03 DIAGNOSIS — Z6824 Body mass index (BMI) 24.0-24.9, adult: Secondary | ICD-10-CM | POA: Diagnosis not present

## 2019-12-03 DIAGNOSIS — T50905A Adverse effect of unspecified drugs, medicaments and biological substances, initial encounter: Secondary | ICD-10-CM | POA: Diagnosis not present

## 2019-12-28 DIAGNOSIS — Z79899 Other long term (current) drug therapy: Secondary | ICD-10-CM | POA: Diagnosis not present

## 2019-12-28 DIAGNOSIS — M7071 Other bursitis of hip, right hip: Secondary | ICD-10-CM | POA: Diagnosis not present

## 2020-01-02 ENCOUNTER — Other Ambulatory Visit: Payer: Self-pay | Admitting: Gastroenterology

## 2020-01-06 DIAGNOSIS — Z20822 Contact with and (suspected) exposure to covid-19: Secondary | ICD-10-CM | POA: Diagnosis not present

## 2020-01-06 DIAGNOSIS — J209 Acute bronchitis, unspecified: Secondary | ICD-10-CM | POA: Diagnosis not present

## 2020-01-06 DIAGNOSIS — Z20828 Contact with and (suspected) exposure to other viral communicable diseases: Secondary | ICD-10-CM | POA: Diagnosis not present

## 2020-01-25 DIAGNOSIS — G894 Chronic pain syndrome: Secondary | ICD-10-CM | POA: Diagnosis not present

## 2020-01-25 DIAGNOSIS — M7918 Myalgia, other site: Secondary | ICD-10-CM | POA: Diagnosis not present

## 2020-01-25 DIAGNOSIS — M5136 Other intervertebral disc degeneration, lumbar region: Secondary | ICD-10-CM | POA: Diagnosis not present

## 2020-01-25 DIAGNOSIS — M961 Postlaminectomy syndrome, not elsewhere classified: Secondary | ICD-10-CM | POA: Diagnosis not present

## 2020-01-25 DIAGNOSIS — M5412 Radiculopathy, cervical region: Secondary | ICD-10-CM | POA: Diagnosis not present

## 2020-03-21 DIAGNOSIS — R21 Rash and other nonspecific skin eruption: Secondary | ICD-10-CM | POA: Diagnosis not present

## 2020-03-21 DIAGNOSIS — J019 Acute sinusitis, unspecified: Secondary | ICD-10-CM | POA: Diagnosis not present

## 2020-04-05 DIAGNOSIS — M8580 Other specified disorders of bone density and structure, unspecified site: Secondary | ICD-10-CM | POA: Diagnosis not present

## 2020-04-05 DIAGNOSIS — G43909 Migraine, unspecified, not intractable, without status migrainosus: Secondary | ICD-10-CM | POA: Diagnosis not present

## 2020-04-05 DIAGNOSIS — K219 Gastro-esophageal reflux disease without esophagitis: Secondary | ICD-10-CM | POA: Diagnosis not present

## 2020-04-25 DIAGNOSIS — M5136 Other intervertebral disc degeneration, lumbar region: Secondary | ICD-10-CM | POA: Diagnosis not present

## 2020-04-25 DIAGNOSIS — Z79899 Other long term (current) drug therapy: Secondary | ICD-10-CM | POA: Diagnosis not present

## 2020-04-25 DIAGNOSIS — M961 Postlaminectomy syndrome, not elsewhere classified: Secondary | ICD-10-CM | POA: Diagnosis not present

## 2020-04-25 DIAGNOSIS — M5412 Radiculopathy, cervical region: Secondary | ICD-10-CM | POA: Diagnosis not present

## 2020-04-25 DIAGNOSIS — G894 Chronic pain syndrome: Secondary | ICD-10-CM | POA: Diagnosis not present

## 2020-06-06 DIAGNOSIS — M542 Cervicalgia: Secondary | ICD-10-CM | POA: Diagnosis not present

## 2020-06-06 DIAGNOSIS — J069 Acute upper respiratory infection, unspecified: Secondary | ICD-10-CM | POA: Diagnosis not present

## 2020-06-08 DIAGNOSIS — M961 Postlaminectomy syndrome, not elsewhere classified: Secondary | ICD-10-CM | POA: Diagnosis not present

## 2020-06-08 DIAGNOSIS — M5412 Radiculopathy, cervical region: Secondary | ICD-10-CM | POA: Diagnosis not present

## 2020-06-08 DIAGNOSIS — M5136 Other intervertebral disc degeneration, lumbar region: Secondary | ICD-10-CM | POA: Diagnosis not present

## 2020-06-08 DIAGNOSIS — M533 Sacrococcygeal disorders, not elsewhere classified: Secondary | ICD-10-CM | POA: Diagnosis not present

## 2020-06-15 DIAGNOSIS — D485 Neoplasm of uncertain behavior of skin: Secondary | ICD-10-CM | POA: Diagnosis not present

## 2020-06-15 DIAGNOSIS — D225 Melanocytic nevi of trunk: Secondary | ICD-10-CM | POA: Diagnosis not present

## 2020-06-15 DIAGNOSIS — L570 Actinic keratosis: Secondary | ICD-10-CM | POA: Diagnosis not present

## 2020-06-15 DIAGNOSIS — L820 Inflamed seborrheic keratosis: Secondary | ICD-10-CM | POA: Diagnosis not present

## 2020-06-29 DIAGNOSIS — L578 Other skin changes due to chronic exposure to nonionizing radiation: Secondary | ICD-10-CM | POA: Diagnosis not present

## 2020-06-29 DIAGNOSIS — L821 Other seborrheic keratosis: Secondary | ICD-10-CM | POA: Diagnosis not present

## 2020-06-29 DIAGNOSIS — D485 Neoplasm of uncertain behavior of skin: Secondary | ICD-10-CM | POA: Diagnosis not present

## 2020-07-20 ENCOUNTER — Other Ambulatory Visit: Payer: Self-pay | Admitting: Gastroenterology

## 2020-08-02 DIAGNOSIS — M961 Postlaminectomy syndrome, not elsewhere classified: Secondary | ICD-10-CM | POA: Diagnosis not present

## 2020-08-02 DIAGNOSIS — M5412 Radiculopathy, cervical region: Secondary | ICD-10-CM | POA: Diagnosis not present

## 2020-08-02 DIAGNOSIS — Z79899 Other long term (current) drug therapy: Secondary | ICD-10-CM | POA: Diagnosis not present

## 2020-08-02 DIAGNOSIS — G894 Chronic pain syndrome: Secondary | ICD-10-CM | POA: Diagnosis not present

## 2020-08-02 DIAGNOSIS — M7918 Myalgia, other site: Secondary | ICD-10-CM | POA: Diagnosis not present

## 2020-08-31 DIAGNOSIS — K589 Irritable bowel syndrome without diarrhea: Secondary | ICD-10-CM | POA: Diagnosis not present

## 2020-08-31 DIAGNOSIS — Z1331 Encounter for screening for depression: Secondary | ICD-10-CM | POA: Diagnosis not present

## 2020-08-31 DIAGNOSIS — R1013 Epigastric pain: Secondary | ICD-10-CM | POA: Diagnosis not present

## 2020-08-31 DIAGNOSIS — Z0000 Encounter for general adult medical examination without abnormal findings: Secondary | ICD-10-CM | POA: Diagnosis not present

## 2020-08-31 DIAGNOSIS — Z6823 Body mass index (BMI) 23.0-23.9, adult: Secondary | ICD-10-CM | POA: Diagnosis not present

## 2020-09-05 DIAGNOSIS — Z6822 Body mass index (BMI) 22.0-22.9, adult: Secondary | ICD-10-CM | POA: Diagnosis not present

## 2020-09-05 DIAGNOSIS — M7061 Trochanteric bursitis, right hip: Secondary | ICD-10-CM | POA: Diagnosis not present

## 2020-09-05 DIAGNOSIS — G894 Chronic pain syndrome: Secondary | ICD-10-CM | POA: Diagnosis not present

## 2020-10-04 DIAGNOSIS — M533 Sacrococcygeal disorders, not elsewhere classified: Secondary | ICD-10-CM | POA: Diagnosis not present

## 2020-10-04 DIAGNOSIS — M5412 Radiculopathy, cervical region: Secondary | ICD-10-CM | POA: Diagnosis not present

## 2020-10-04 DIAGNOSIS — G894 Chronic pain syndrome: Secondary | ICD-10-CM | POA: Diagnosis not present

## 2020-10-04 DIAGNOSIS — M961 Postlaminectomy syndrome, not elsewhere classified: Secondary | ICD-10-CM | POA: Diagnosis not present

## 2020-10-14 DIAGNOSIS — J040 Acute laryngitis: Secondary | ICD-10-CM | POA: Diagnosis not present

## 2020-10-14 DIAGNOSIS — J069 Acute upper respiratory infection, unspecified: Secondary | ICD-10-CM | POA: Diagnosis not present

## 2020-10-14 DIAGNOSIS — Z20828 Contact with and (suspected) exposure to other viral communicable diseases: Secondary | ICD-10-CM | POA: Diagnosis not present

## 2020-11-01 DIAGNOSIS — Z124 Encounter for screening for malignant neoplasm of cervix: Secondary | ICD-10-CM | POA: Diagnosis not present

## 2020-11-01 DIAGNOSIS — Z01419 Encounter for gynecological examination (general) (routine) without abnormal findings: Secondary | ICD-10-CM | POA: Diagnosis not present

## 2020-11-01 DIAGNOSIS — Z6822 Body mass index (BMI) 22.0-22.9, adult: Secondary | ICD-10-CM | POA: Diagnosis not present

## 2020-11-01 DIAGNOSIS — G43909 Migraine, unspecified, not intractable, without status migrainosus: Secondary | ICD-10-CM | POA: Insufficient documentation

## 2020-11-01 DIAGNOSIS — L900 Lichen sclerosus et atrophicus: Secondary | ICD-10-CM | POA: Diagnosis not present

## 2020-11-01 HISTORY — DX: Migraine, unspecified, not intractable, without status migrainosus: G43.909

## 2020-11-02 ENCOUNTER — Other Ambulatory Visit: Payer: Self-pay | Admitting: Obstetrics & Gynecology

## 2020-11-02 DIAGNOSIS — Z1231 Encounter for screening mammogram for malignant neoplasm of breast: Secondary | ICD-10-CM

## 2020-11-03 DIAGNOSIS — I10 Essential (primary) hypertension: Secondary | ICD-10-CM | POA: Diagnosis not present

## 2020-11-03 DIAGNOSIS — E785 Hyperlipidemia, unspecified: Secondary | ICD-10-CM | POA: Diagnosis not present

## 2020-11-24 ENCOUNTER — Other Ambulatory Visit: Payer: Self-pay | Admitting: Cardiology

## 2020-12-02 DIAGNOSIS — M25559 Pain in unspecified hip: Secondary | ICD-10-CM | POA: Diagnosis not present

## 2020-12-02 DIAGNOSIS — M62838 Other muscle spasm: Secondary | ICD-10-CM | POA: Diagnosis not present

## 2020-12-02 DIAGNOSIS — R102 Pelvic and perineal pain: Secondary | ICD-10-CM | POA: Diagnosis not present

## 2020-12-02 DIAGNOSIS — M6281 Muscle weakness (generalized): Secondary | ICD-10-CM | POA: Diagnosis not present

## 2020-12-02 DIAGNOSIS — M6289 Other specified disorders of muscle: Secondary | ICD-10-CM | POA: Diagnosis not present

## 2020-12-02 DIAGNOSIS — N941 Unspecified dyspareunia: Secondary | ICD-10-CM | POA: Diagnosis not present

## 2020-12-15 DIAGNOSIS — M6289 Other specified disorders of muscle: Secondary | ICD-10-CM | POA: Diagnosis not present

## 2020-12-15 DIAGNOSIS — R102 Pelvic and perineal pain: Secondary | ICD-10-CM | POA: Diagnosis not present

## 2020-12-15 DIAGNOSIS — M6281 Muscle weakness (generalized): Secondary | ICD-10-CM | POA: Diagnosis not present

## 2020-12-15 DIAGNOSIS — L900 Lichen sclerosus et atrophicus: Secondary | ICD-10-CM | POA: Diagnosis not present

## 2020-12-15 DIAGNOSIS — M25559 Pain in unspecified hip: Secondary | ICD-10-CM | POA: Diagnosis not present

## 2020-12-15 DIAGNOSIS — N762 Acute vulvitis: Secondary | ICD-10-CM | POA: Diagnosis not present

## 2020-12-15 DIAGNOSIS — N941 Unspecified dyspareunia: Secondary | ICD-10-CM | POA: Diagnosis not present

## 2020-12-15 DIAGNOSIS — M62838 Other muscle spasm: Secondary | ICD-10-CM | POA: Diagnosis not present

## 2020-12-21 DIAGNOSIS — R102 Pelvic and perineal pain: Secondary | ICD-10-CM | POA: Diagnosis not present

## 2020-12-21 DIAGNOSIS — M6281 Muscle weakness (generalized): Secondary | ICD-10-CM | POA: Diagnosis not present

## 2020-12-21 DIAGNOSIS — M25559 Pain in unspecified hip: Secondary | ICD-10-CM | POA: Diagnosis not present

## 2020-12-21 DIAGNOSIS — M62838 Other muscle spasm: Secondary | ICD-10-CM | POA: Diagnosis not present

## 2020-12-21 DIAGNOSIS — N941 Unspecified dyspareunia: Secondary | ICD-10-CM | POA: Diagnosis not present

## 2020-12-21 DIAGNOSIS — M6289 Other specified disorders of muscle: Secondary | ICD-10-CM | POA: Diagnosis not present

## 2020-12-27 DIAGNOSIS — Z79899 Other long term (current) drug therapy: Secondary | ICD-10-CM | POA: Diagnosis not present

## 2020-12-27 DIAGNOSIS — M5136 Other intervertebral disc degeneration, lumbar region: Secondary | ICD-10-CM | POA: Diagnosis not present

## 2020-12-27 DIAGNOSIS — M533 Sacrococcygeal disorders, not elsewhere classified: Secondary | ICD-10-CM | POA: Diagnosis not present

## 2020-12-27 DIAGNOSIS — G894 Chronic pain syndrome: Secondary | ICD-10-CM | POA: Diagnosis not present

## 2020-12-27 DIAGNOSIS — M961 Postlaminectomy syndrome, not elsewhere classified: Secondary | ICD-10-CM | POA: Diagnosis not present

## 2020-12-29 ENCOUNTER — Other Ambulatory Visit: Payer: Self-pay | Admitting: Gastroenterology

## 2020-12-30 DIAGNOSIS — Z1231 Encounter for screening mammogram for malignant neoplasm of breast: Secondary | ICD-10-CM | POA: Diagnosis not present

## 2021-01-10 ENCOUNTER — Other Ambulatory Visit: Payer: Self-pay | Admitting: Gastroenterology

## 2021-01-11 DIAGNOSIS — R309 Painful micturition, unspecified: Secondary | ICD-10-CM | POA: Diagnosis not present

## 2021-01-11 DIAGNOSIS — L293 Anogenital pruritus, unspecified: Secondary | ICD-10-CM | POA: Diagnosis not present

## 2021-02-06 ENCOUNTER — Other Ambulatory Visit: Payer: Self-pay | Admitting: Cardiology

## 2021-02-13 DIAGNOSIS — Z79899 Other long term (current) drug therapy: Secondary | ICD-10-CM | POA: Diagnosis not present

## 2021-03-17 DIAGNOSIS — G894 Chronic pain syndrome: Secondary | ICD-10-CM | POA: Diagnosis not present

## 2021-03-17 DIAGNOSIS — M7061 Trochanteric bursitis, right hip: Secondary | ICD-10-CM | POA: Diagnosis not present

## 2021-04-04 DIAGNOSIS — M533 Sacrococcygeal disorders, not elsewhere classified: Secondary | ICD-10-CM | POA: Diagnosis not present

## 2021-04-04 DIAGNOSIS — M961 Postlaminectomy syndrome, not elsewhere classified: Secondary | ICD-10-CM | POA: Diagnosis not present

## 2021-04-04 DIAGNOSIS — M5412 Radiculopathy, cervical region: Secondary | ICD-10-CM | POA: Diagnosis not present

## 2021-04-07 DIAGNOSIS — Z20828 Contact with and (suspected) exposure to other viral communicable diseases: Secondary | ICD-10-CM | POA: Diagnosis not present

## 2021-04-07 DIAGNOSIS — J329 Chronic sinusitis, unspecified: Secondary | ICD-10-CM | POA: Diagnosis not present

## 2021-04-07 DIAGNOSIS — J40 Bronchitis, not specified as acute or chronic: Secondary | ICD-10-CM | POA: Diagnosis not present

## 2021-05-02 DIAGNOSIS — L259 Unspecified contact dermatitis, unspecified cause: Secondary | ICD-10-CM | POA: Diagnosis not present

## 2021-05-02 DIAGNOSIS — Z20828 Contact with and (suspected) exposure to other viral communicable diseases: Secondary | ICD-10-CM | POA: Diagnosis not present

## 2021-05-02 DIAGNOSIS — U071 COVID-19: Secondary | ICD-10-CM | POA: Diagnosis not present

## 2021-05-02 DIAGNOSIS — J069 Acute upper respiratory infection, unspecified: Secondary | ICD-10-CM | POA: Diagnosis not present

## 2021-05-02 DIAGNOSIS — R21 Rash and other nonspecific skin eruption: Secondary | ICD-10-CM | POA: Diagnosis not present

## 2021-05-10 DIAGNOSIS — Z6823 Body mass index (BMI) 23.0-23.9, adult: Secondary | ICD-10-CM | POA: Diagnosis not present

## 2021-05-10 DIAGNOSIS — U099 Post covid-19 condition, unspecified: Secondary | ICD-10-CM | POA: Diagnosis not present

## 2021-05-23 ENCOUNTER — Other Ambulatory Visit: Payer: Self-pay | Admitting: Gastroenterology

## 2021-05-28 ENCOUNTER — Other Ambulatory Visit: Payer: Self-pay | Admitting: Gastroenterology

## 2021-05-30 ENCOUNTER — Telehealth: Payer: Self-pay | Admitting: Gastroenterology

## 2021-05-30 MED ORDER — DEXLANSOPRAZOLE 60 MG PO CPDR: DELAYED_RELEASE_CAPSULE | ORAL | 6 refills | Status: DC

## 2021-05-30 MED ORDER — FAMOTIDINE 40 MG PO TABS: 40.0000 mg | ORAL_TABLET | Freq: Every day | ORAL | 6 refills | Status: DC

## 2021-05-30 NOTE — Telephone Encounter (Signed)
Im glad to see she finally made an appointment after we have told her though her medications instructions multiple times. Script sent

## 2021-05-30 NOTE — Telephone Encounter (Signed)
Patient called said she went to the pharmacy and they told her they have no script for her patient is requesting a refill on the Dexilant and famotidine.

## 2021-06-03 IMAGING — CR DG CHEST 2V
2 series · 2 of 2 positions shown · non-contrast
Comparison: Chest radiograph 04/11/2019

CLINICAL DATA: Shortness of breath. Additional history provided:
Patient COVID positive 2 weeks ago, continued shortness of breath on
exertion, intermittent fever, nausea, vomiting, diarrhea.

EXAM:
CHEST - 2 VIEW

[w chest pa]
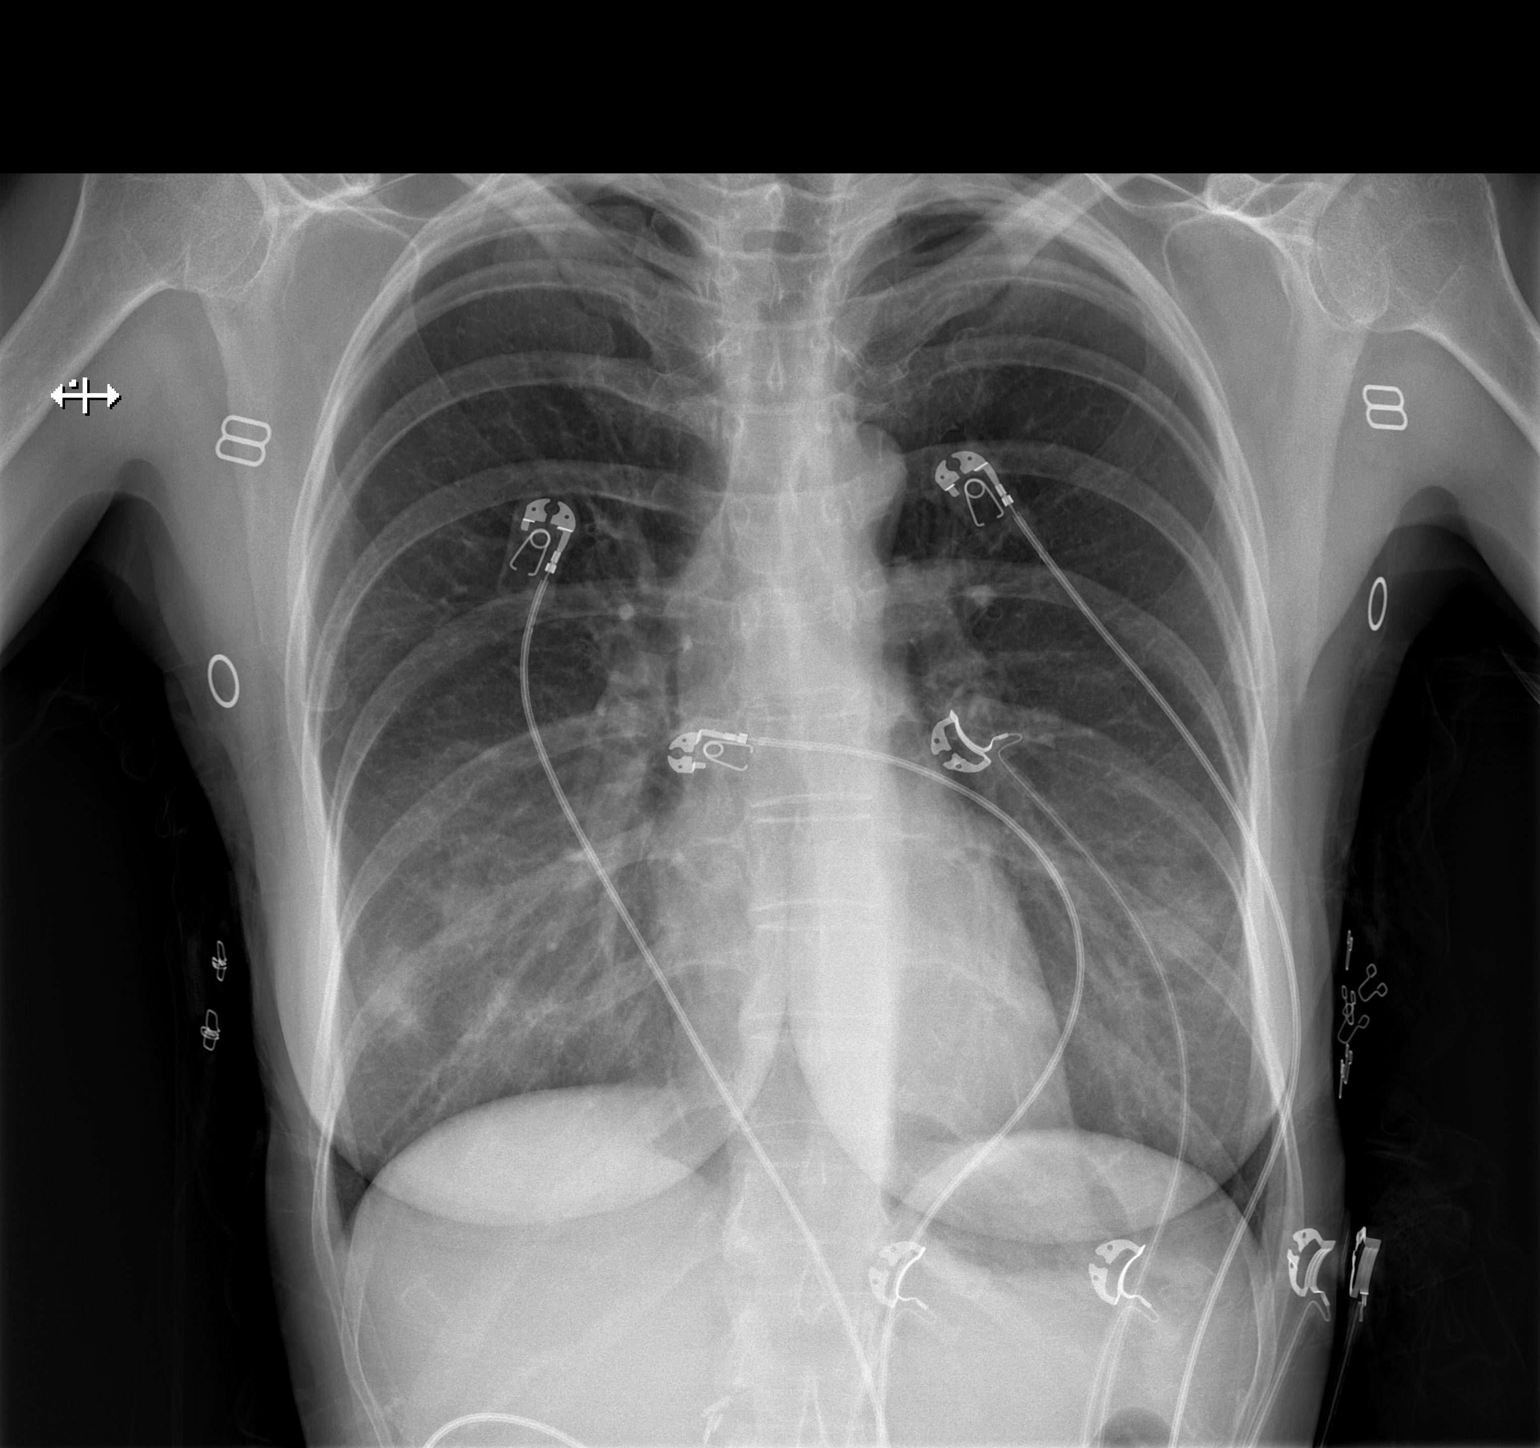

[w chest lat]
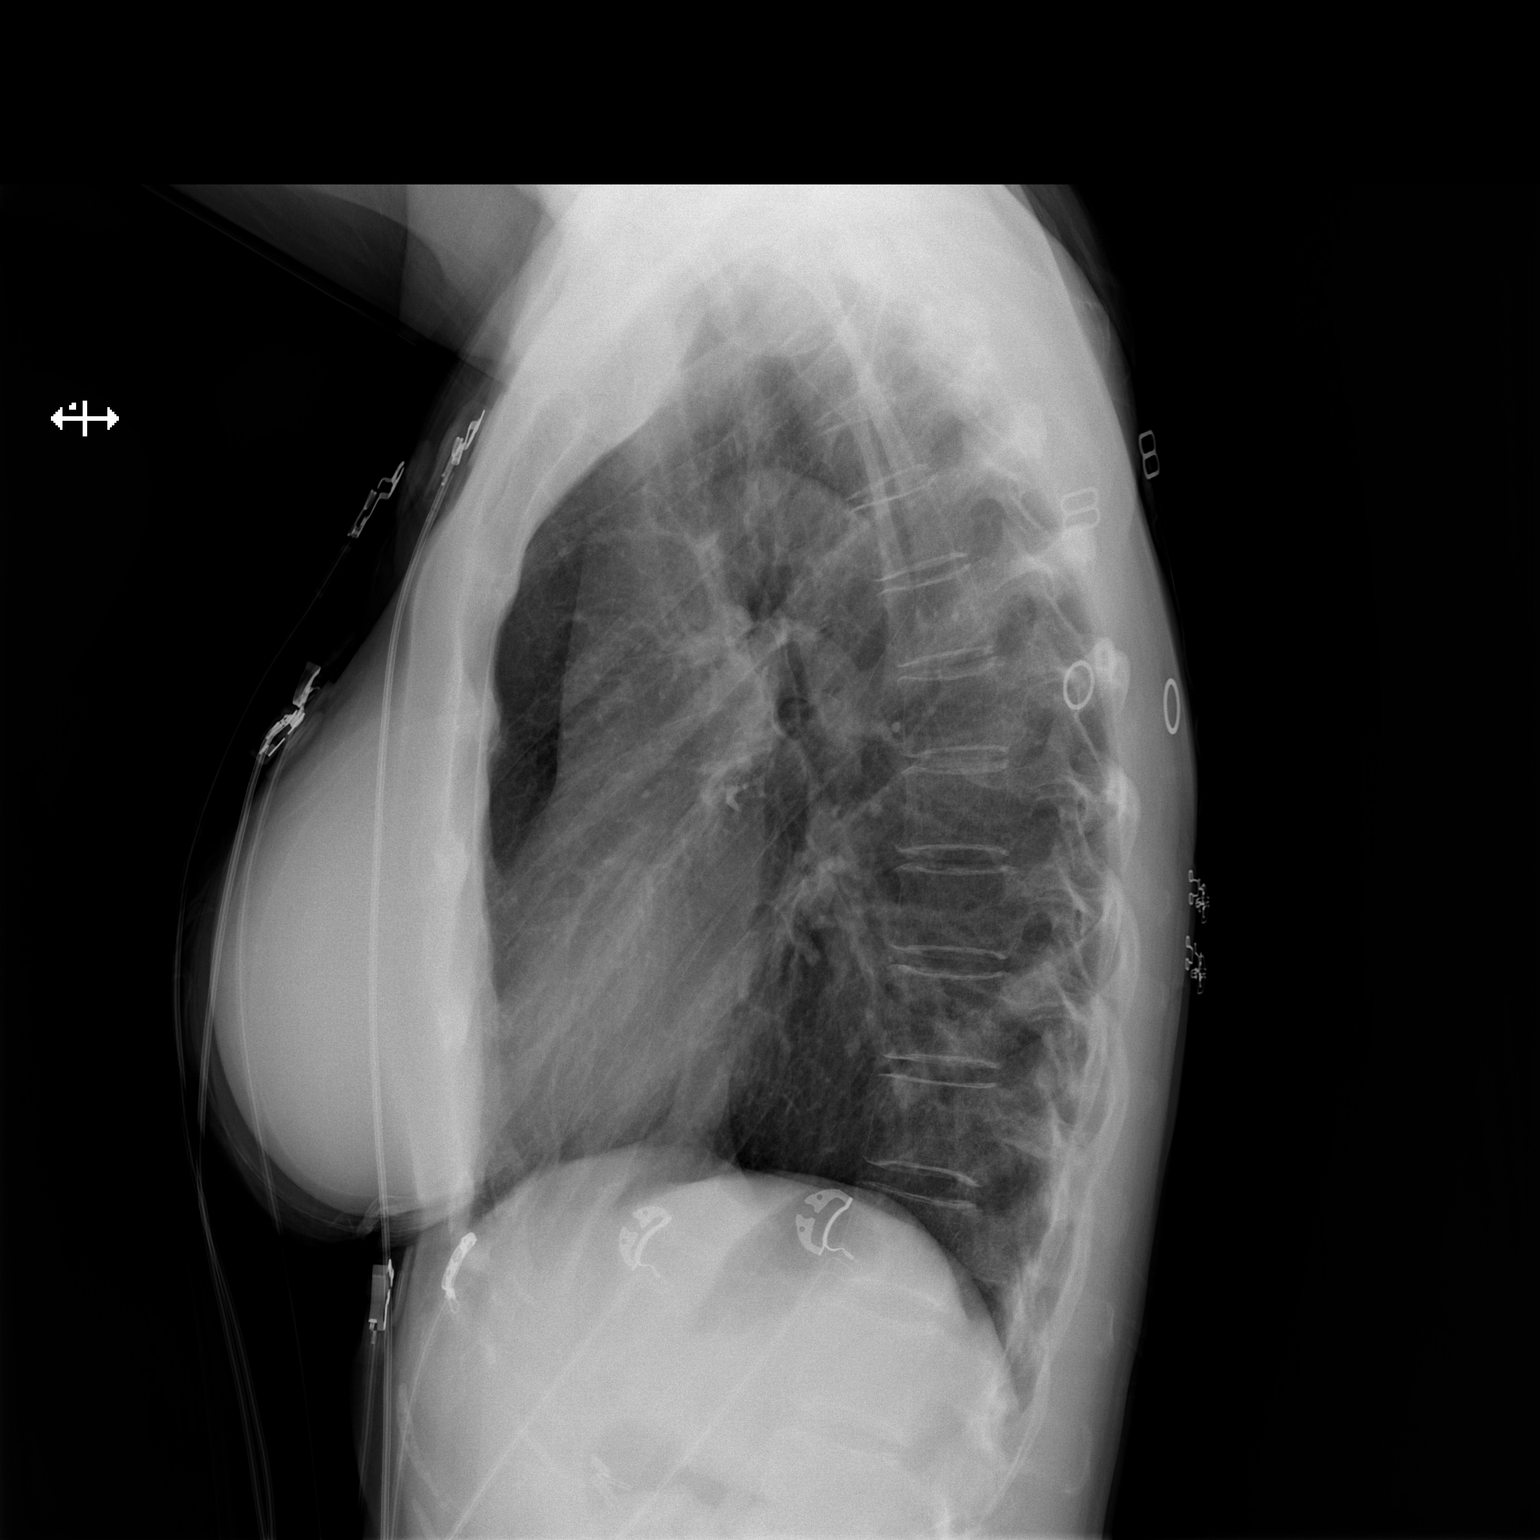

[2 of 2 positions shown; findings below may reference images not displayed]

FINDINGS: Heart size within normal limits.

Ill-defined airspace opacities within the right lung base. Subtle
airspace opacities also questioned within the left lung base.

No pleural effusion or pneumothorax.

No acute bony abnormality.

Surgical clips within the upper abdomen.
IMPRESSION: Ill-defined airspace opacities within the right lung base. Subtle
airspace opacities also questioned within the left lung base.
Findings are consistent with pneumonia given provided history.
Radiographic follow-up to resolution recommended.

## 2021-06-05 IMAGING — US US ABDOMEN LIMITED
1 series · 14 of 25 positions shown · non-contrast
Comparison: 02/06/2018

CLINICAL DATA: Right upper quadrant pain, [id number] positivity

EXAM:
ULTRASOUND ABDOMEN LIMITED RIGHT UPPER QUADRANT

[Series 1: us abdomen limited · 14 of 32 slices shown]
[im 1/32]
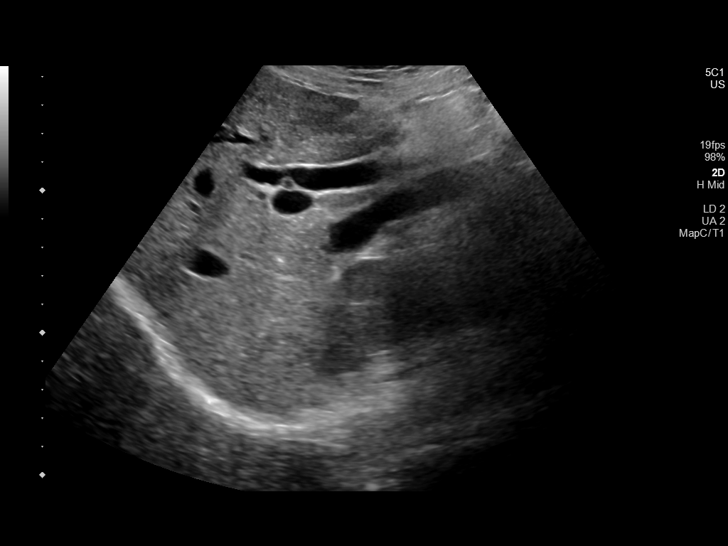
[im 3/32]
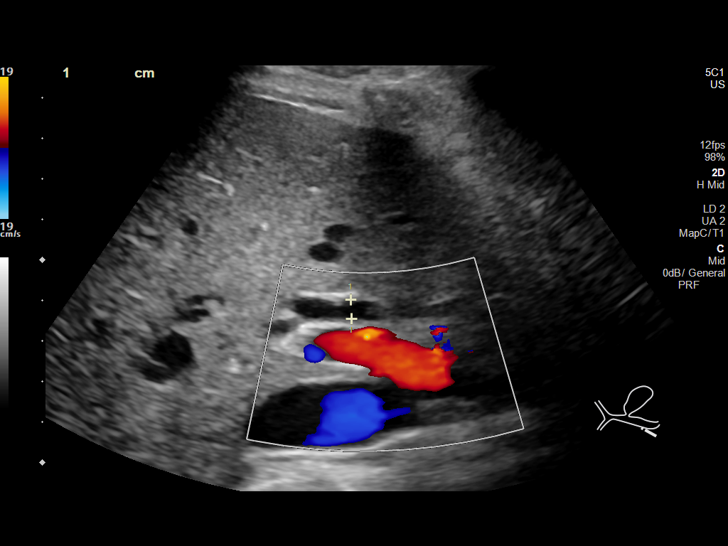
[im 6/32]
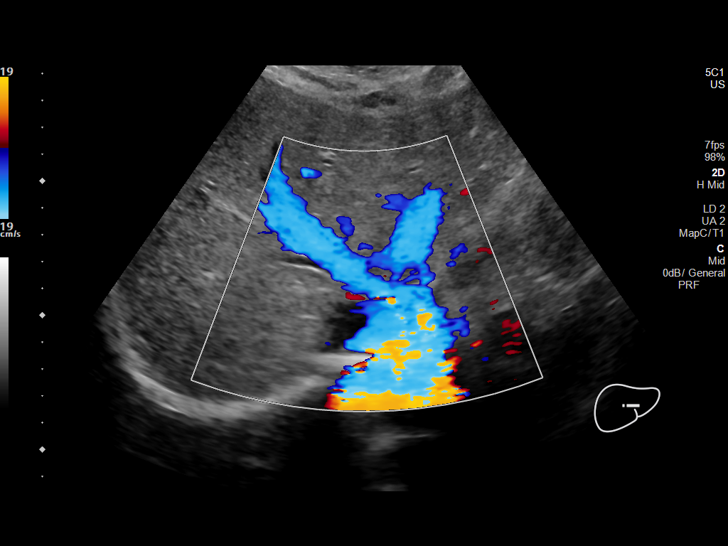
[im 8/32]
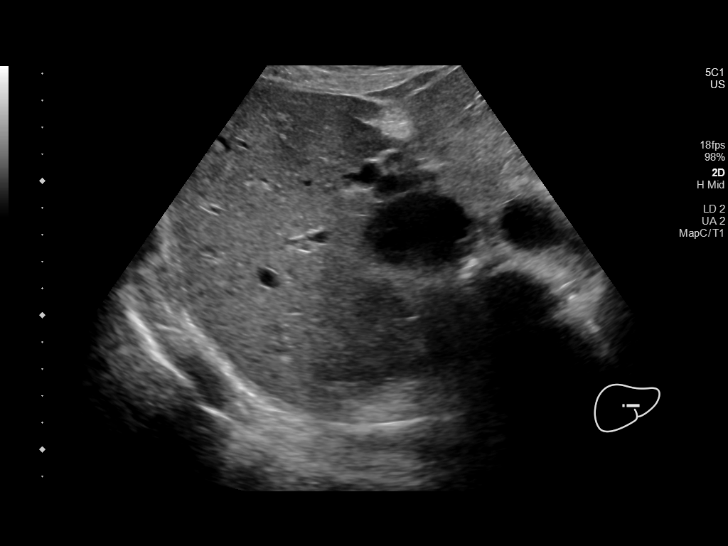
[im 11/32]
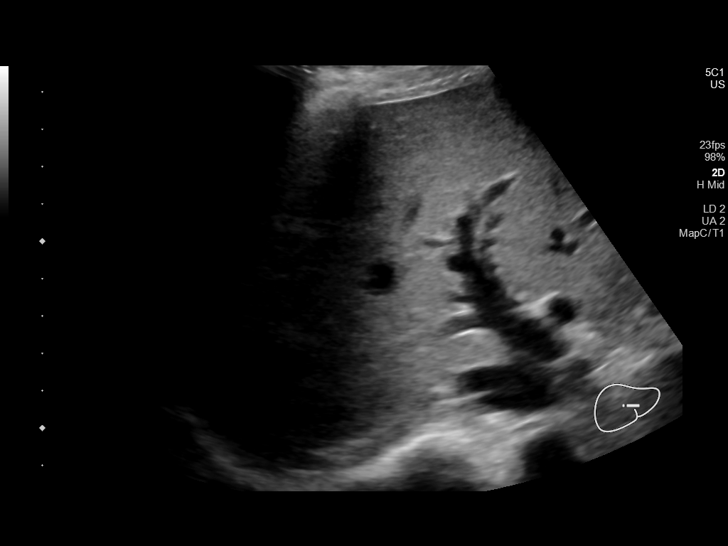
[im 12/32]
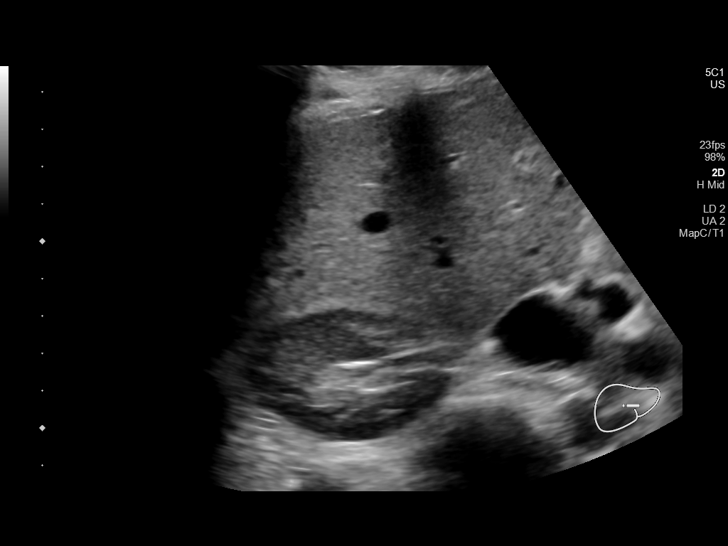
[im 15/32]
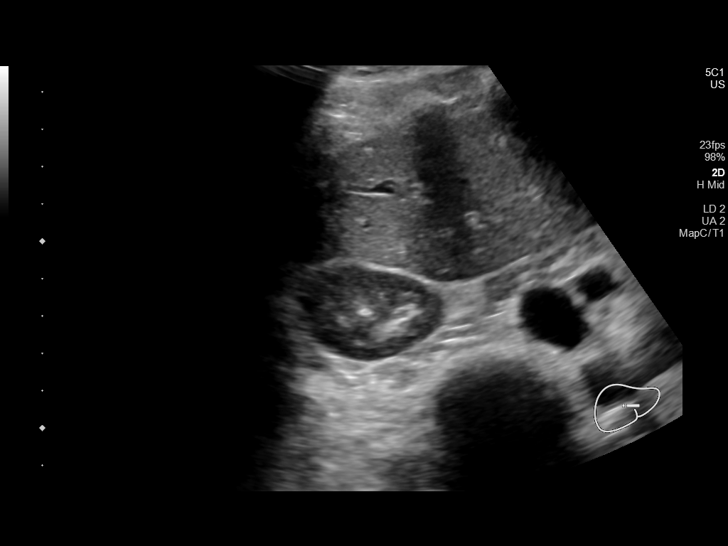
[im 17/32]
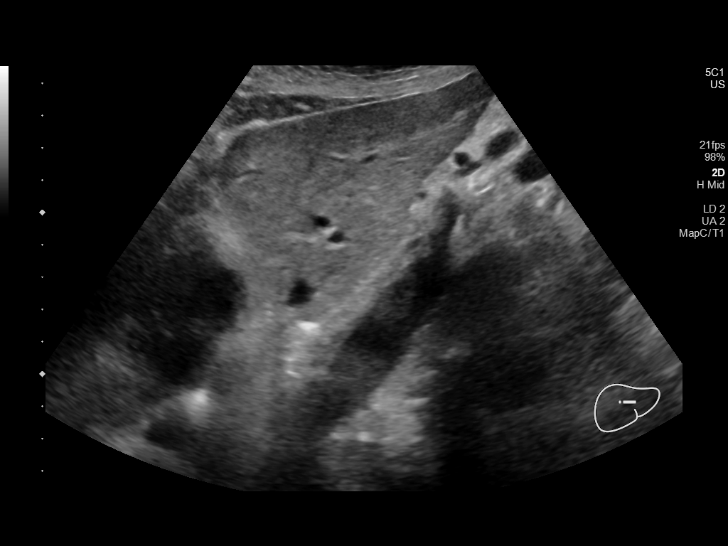
[im 20/32]
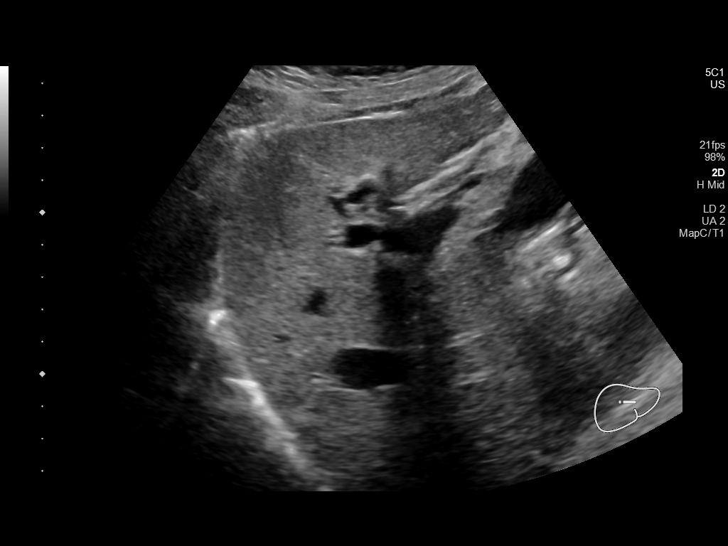
[im 21/32]
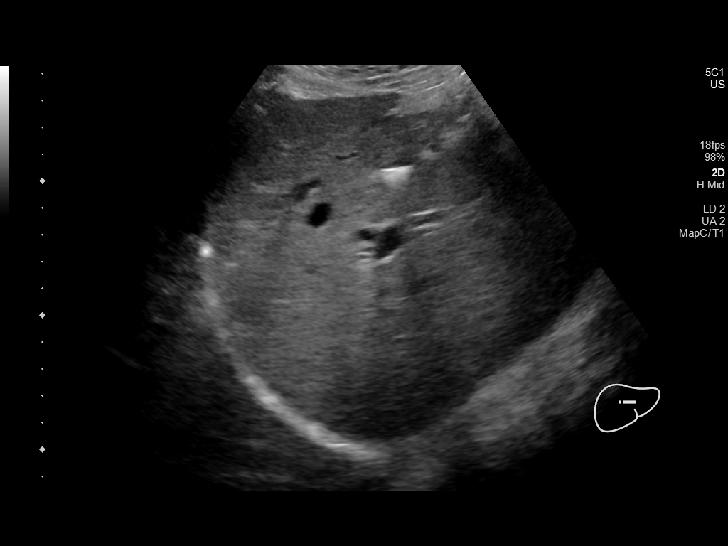
[im 24/32]
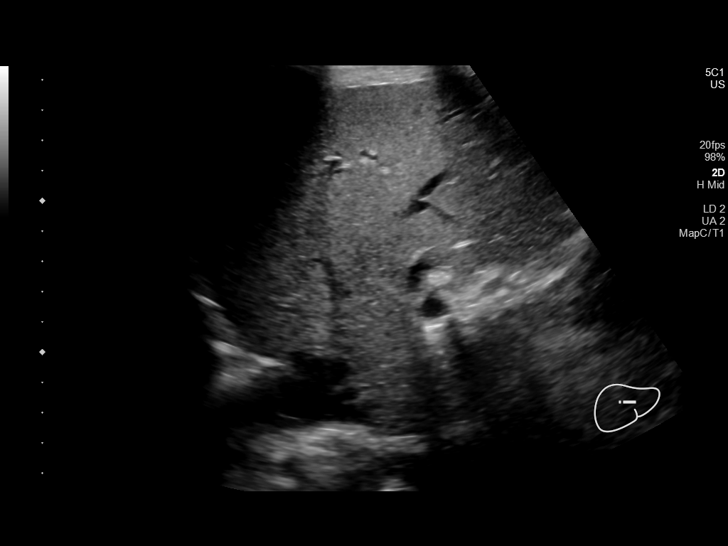
[im 26/32]
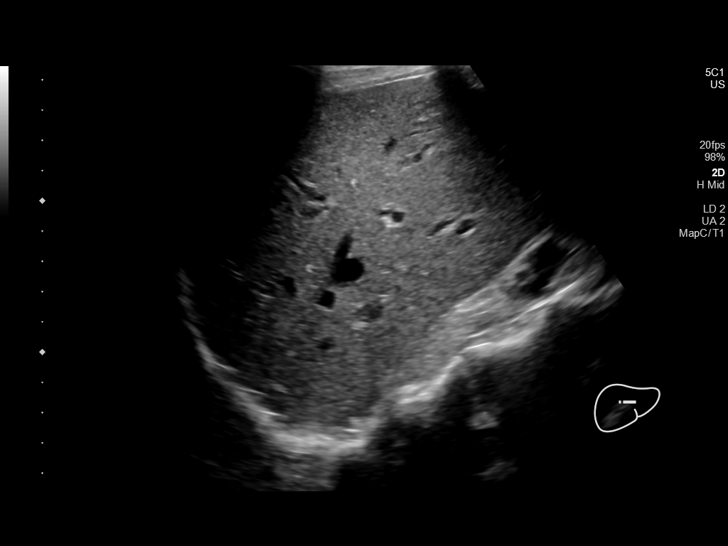
[im 29/32]
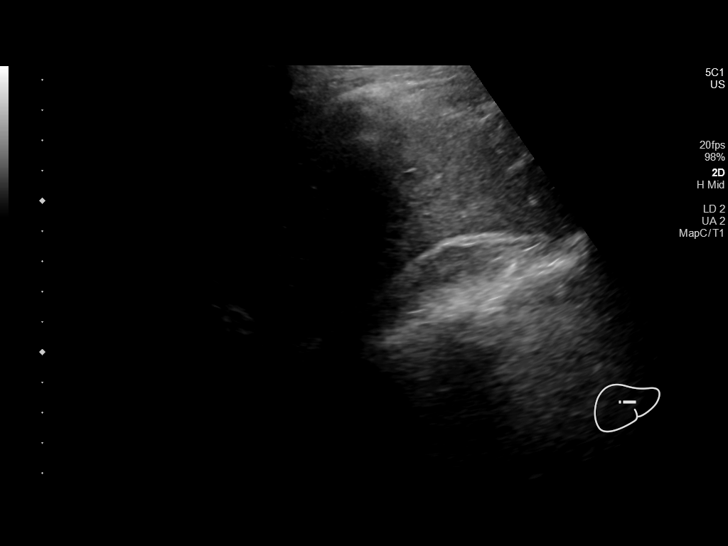
[im 32/32]
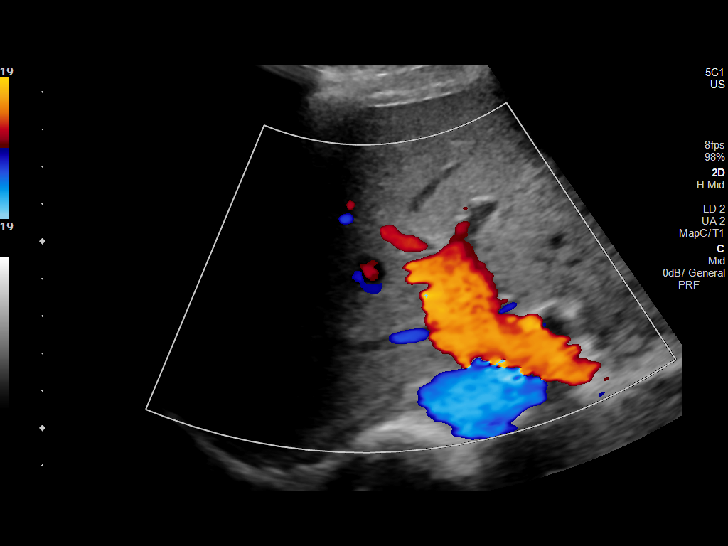

[14 of 25 positions shown; findings below may reference images not displayed]

FINDINGS: Gallbladder:

Surgically removed

Common bile duct:

Diameter: 4.7 mm.

Liver:

No focal lesion identified. Within normal limits in parenchymal
echogenicity. Portal vein is patent on color Doppler imaging with
normal direction of blood flow towards the liver.

Other: None.
IMPRESSION: Status post cholecystectomy.  No acute abnormality is noted.

## 2021-06-21 ENCOUNTER — Telehealth: Payer: Self-pay | Admitting: Gastroenterology

## 2021-06-21 DIAGNOSIS — C44719 Basal cell carcinoma of skin of left lower limb, including hip: Secondary | ICD-10-CM | POA: Diagnosis not present

## 2021-06-21 DIAGNOSIS — L650 Telogen effluvium: Secondary | ICD-10-CM | POA: Diagnosis not present

## 2021-06-21 DIAGNOSIS — L820 Inflamed seborrheic keratosis: Secondary | ICD-10-CM | POA: Diagnosis not present

## 2021-06-21 DIAGNOSIS — C44319 Basal cell carcinoma of skin of other parts of face: Secondary | ICD-10-CM | POA: Diagnosis not present

## 2021-06-21 DIAGNOSIS — L245 Irritant contact dermatitis due to other chemical products: Secondary | ICD-10-CM | POA: Diagnosis not present

## 2021-06-21 NOTE — Telephone Encounter (Signed)
Inbound call from patient stated that she has an OV with Dr. Lyndel Safe tomorrow at 59 and would like to see if there is anyway she can get that switched to a virtual. Please advise.

## 2021-06-21 NOTE — Telephone Encounter (Signed)
Spoke with the patient and explained she has not been seen since 2018 so she will need to come in to the office. We can not do a virtual visit for her.

## 2021-06-22 ENCOUNTER — Other Ambulatory Visit: Payer: Self-pay

## 2021-06-22 ENCOUNTER — Encounter: Payer: Self-pay | Admitting: Gastroenterology

## 2021-06-22 ENCOUNTER — Ambulatory Visit: Payer: PPO | Admitting: Gastroenterology

## 2021-06-22 VITALS — BP 126/86 | HR 73 | Ht 67.0 in | Wt 143.0 lb

## 2021-06-22 DIAGNOSIS — Z800 Family history of malignant neoplasm of digestive organs: Secondary | ICD-10-CM

## 2021-06-22 DIAGNOSIS — K200 Eosinophilic esophagitis: Secondary | ICD-10-CM

## 2021-06-22 DIAGNOSIS — K580 Irritable bowel syndrome with diarrhea: Secondary | ICD-10-CM

## 2021-06-22 DIAGNOSIS — R1310 Dysphagia, unspecified: Secondary | ICD-10-CM | POA: Diagnosis not present

## 2021-06-22 DIAGNOSIS — K2100 Gastro-esophageal reflux disease with esophagitis, without bleeding: Secondary | ICD-10-CM | POA: Diagnosis not present

## 2021-06-22 MED ORDER — DEXLANSOPRAZOLE 60 MG PO CPDR: DELAYED_RELEASE_CAPSULE | ORAL | 4 refills | Status: DC

## 2021-06-22 MED ORDER — DICYCLOMINE HCL 10 MG PO CAPS: 10.0000 mg | ORAL_CAPSULE | Freq: Two times a day (BID) | ORAL | 2 refills | Status: DC | PRN

## 2021-06-22 MED ORDER — FAMOTIDINE 40 MG PO TABS: 40.0000 mg | ORAL_TABLET | Freq: Every day | ORAL | 4 refills | Status: DC

## 2021-06-22 NOTE — Patient Instructions (Signed)
If you are age 52 or older, your body mass index should be between 23-30. Your Body mass index is 22.4 kg/m. If this is out of the aforementioned range listed, please consider follow up with your Primary Care Provider.  If you are age 47 or younger, your body mass index should be between 19-25. Your Body mass index is 22.4 kg/m. If this is out of the aformentioned range listed, please consider follow up with your Primary Care Provider.   __________________________________________________________  The [facility] providers would like to encourage you to use [facility] to communicate with providers for non-urgent requests or questions.  Due to long hold times on the telephone, sending your provider a message by [facility] may be a faster and more efficient way to get a response.  Please allow 48 business hours for a response.  Please remember that this is for non-urgent requests.    We have sent the following medications to your pharmacy for you to pick up at your convenience: Dexilant, Pepcid, Bentyl  You have been scheduled for an endoscopy and colonoscopy. Please follow the written instructions given to you at your visit today. Please pick up your prep supplies at the pharmacy within the next 1-3 days. If you use inhalers (even only as needed), please bring them with you on the day of your procedure.   It was a pleasure to see you today!  [name], M.D.

## 2021-06-22 NOTE — Progress Notes (Signed)
Chief Complaint: FU  Referring Provider:  Mateo Flow, MD  ,     ASSESSMENT AND PLAN;   #1.  GERD with occ dysphgia. H/O EoE in past.  #2.  IBS-C  #3.  FH of colon cancer (mom at age 52).   #4.  EoE (on EGD 02/2017), neg SB Bx for celiac.  Asymptomatic.  Plan:  -Dexilant 60mg  po QD #90, 4RF -pepcid 40mg  po QHS #90 4RF -EGD with eso Bx and dil/colon with miralax  -Bentyl 10mg  po BID prn #60, 2 RF -Dr Laurelyn Sickle office - blood test results.    I discussed EGD/Colonoscopy- the indications, risks, alternatives and potential complications including, but not limited to bleeding, infection, reaction to meds, damage to internal organs, cardiac and/or pulmonary problems, and perforation requiring surgery. The possibility that significant findings could be missed was explained. All ? were answered. Pt consents to proceed.  HPI:    Lisa [name] is a 51 y.o. female  For follow-up visit  Unfortunately had COVID second time from which she has recovered.  Unfortunately, this did result in increased GI symptoms including heartburn, regurgitation with occasional dysphagia.  She is better with Dexilant in the morning and Pepcid at night.  No odynophagia.  No fever chills or night sweats.  Continues to have constipation mostly with occasional diarrhea.  Has been on MiraLAX 17 g p.o. once a day the days when she is constipated.  She had few episodes of rectal bleeding in December 2022 which has resolved now.  Seen by Dr. Drue Novel blood work performed.  Records are awaited.  She is due for repeat colonoscopy and would like to get EGD done at the same time.  No weight loss.  No jaundice dark urine or pale stools.  From previous notes: -Adm to [facility] for acute respiratory failure with hypoxia d/t COVID-19 pneumonia requiring oxygen.  Treated with steroids/oxygen/did not require intubation.  Started having increasing liver function tests.  Remdesivir was stopped.  Ultrasound abdomen neg.  Negative acute hepatitis panel.  Note that she is s/p cholecystectomy.  Subsequently LFTs normalized.  Husband Dellis Filbert did not have COVID-19.  Unfortunately, Janaisa's mom had COVID-19.  Has recovered.  Wt Readings from Last 3 Encounters:  06/22/21 143 lb (64.9 kg)  09/29/19 136 lb (61.7 kg)  08/25/19 138 lb (62.6 kg)   Past GI procedures: Colonoscopy 08/17/2016 ([facility]): Small internal hemorrhoids.  Otherwise normal to TI.  Repeat in 5 years d/t FH of colon cancer (mom)  EGD 02/2017: -Gastritis -Eso bx with increased eosinophils consistent with eosinophilic esophagitis (>41 HPF) -Negative small bowel biopsies for celiac.  -S/P laparoscopic cholecystectomy 2003 d/t biliary dyskinesia. -S/p laparoscopy 1992 due to chronic pelvic pain/endometriosis, multiple laparoscopic surgeries. -Back surgery x2   Past Medical History:  Diagnosis Date   Acute respiratory disease due to COVID-19 virus 04/17/2019   Anxiety    Bursitis 07/12/2015   Chronic back pain    Chronic constipation    Chronic interstitial cystitis 11/12/2016   DDD (degenerative disc disease), cervical 01/02/2019   MRI from February 01, 2017 showed mild degenerative changes.   DDD (degenerative disc disease), lumbar 01/02/2019   Status post fusion in 2009 and 2010   Depression    Eosinophilic esophagitis    FH: colon cancer    GERD (gastroesophageal reflux disease)    Hiatal hernia    History of Clostridioides difficile infection 01/02/2019   History of endometriosis 01/02/2019   History of gastroesophageal reflux (GERD) 01/02/2019  History of migraine 01/02/2019   Hx of Clostridium difficile infection    IBS (irritable bowel syndrome)    Interstitial cystitis 01/02/2019   Major depressive disorder, single episode, unspecified 06/09/2013   Myofascial pain 06/04/2018   Post laminectomy syndrome 10/09/2013   Primary osteoarthritis of both hands 01/02/2019   Clinical and radiographic findings are consistent with osteoarthritis.  All  autoimmune work-up was negative.  Use of Voltaren gel and natural anti-inflammatorie   SI (sacroiliac) pain 09/09/2015    Past Surgical History:  Procedure Laterality Date   CHOLECYSTECTOMY  2002   COLONOSCOPY  08/17/2016   Internal hemorrhoids.    ESOPHAGOGASTRODUODENOSCOPY  03/01/2017   Gastritis.    LUMBAR FUSION     2009, 2010, 2011   PLACEMENT OF BREAST IMPLANTS      Family History  Problem Relation Age of Onset   Colon cancer Mother        took 3 feet of colon    Rheumatic fever Mother        age 53-13.5   Alcoholism Father    Alcoholism Brother    Hypertension Brother    Heart attack Maternal Aunt    Stomach cancer Maternal Aunt    Liver cancer Maternal Aunt    Pancreatic cancer Maternal Aunt    Heart attack Maternal Uncle    Lung cancer Maternal Uncle    Kidney cancer Maternal Uncle    Leukemia Paternal Aunt    Esophageal cancer Neg Hx    Rectal cancer Neg Hx     Social History   Tobacco Use   Smoking status: Former   Smokeless tobacco: Never   Tobacco comments:    quit 15 years ago like 2004  Vaping Use   Vaping Use: Never used  Substance Use Topics   Alcohol use: Never   Drug use: Never    Current Outpatient Medications  Medication Sig Dispense Refill   albuterol (VENTOLIN HFA) 108 (90 Base) MCG/ACT inhaler Inhale 2 puffs into the lungs every 4 (four) hours as needed.     bimatoprost (LATISSE) 0.03 % ophthalmic solution Apply 1 drop to eye at bedtime.     Calcium Carbonate Antacid (TUMS PO) Take 1 tablet by mouth as needed.     COMBIVENT RESPIMAT 20-100 MCG/ACT AERS respimat Inhale 1 puff into the lungs every 6 (six) hours as needed.     dexlansoprazole (DEXILANT) 60 MG capsule TAKE 1 CAPSULE(60 MG) BY MOUTH DAILY 30 capsule 6   dexlansoprazole (DEXILANT) 60 MG capsule TAKE 1 CAPSULE(60 MG) BY MOUTH DAILY 30 capsule 6   dextromethorphan-guaiFENesin (MUCINEX DM) 30-600 MG 12hr tablet Take 1 tablet by mouth 2 (two) times daily. 30 tablet 0    diclofenac (VOLTAREN) 75 MG EC tablet TAKE 1 TABLET(75 MG) BY MOUTH TWICE DAILY 180 tablet 0   famotidine (PEPCID) 40 MG tablet Take 1 tablet (40 mg total) by mouth daily. 30 tablet 6   FLOVENT HFA 110 MCG/ACT inhaler Inhale 2 puffs into the lungs daily as needed (shortness of breath).   3   HYDROcodone-acetaminophen (NORCO) 10-325 MG tablet Take 1 tablet by mouth every 4 (four) hours as needed.      Melatonin 10 MG CAPS Take 30 mg by mouth at bedtime.      methocarbamol (ROBAXIN) 750 MG tablet Take 750 mg by mouth 3 (three) times daily.      metoprolol succinate (TOPROL-XL) 25 MG 24 hr tablet TAKE 1 TABLET(25 MG) BY MOUTH DAILY 90 tablet 3  metoprolol tartrate (LOPRESSOR) 25 MG tablet TAKE 1 TABLET BY MOUTH DAILY AS NEEDED FOR RAPID HEART RATE 90 tablet 1   morphine (MS CONTIN) 30 MG 12 hr tablet Take 30 mg by mouth 3 (three) times daily.      ondansetron (ZOFRAN-ODT) 4 MG disintegrating tablet Take 4 mg by mouth every 6 (six) hours as needed for nausea/vomiting.     pentosan polysulfate (ELMIRON) 100 MG capsule Take 100 mg by mouth as needed (IC flareup).      polyethylene glycol (MIRALAX / GLYCOLAX) packet Take 17 g by mouth as needed.      rizatriptan (MAXALT) 10 MG tablet 1 tablet as needed.     Current Facility-Administered Medications  Medication Dose Route Frequency Provider Last Rate Last Admin   triamcinolone acetonide (KENALOG) 10 MG/ML injection 10 mg  10 mg Other Once Landis Martins, DPM        Allergies  Allergen Reactions   Azithromycin Other (See Comments)    Unknown    Latex Other (See Comments)    Unknown    Metaxalone Itching   Shellfish Allergy     Review of Systems:  Has anxiety/depression Has multiple allergies-seen by Dr. Neldon Mc Chronic back pain requiring narcotics Headaches Fatigue     Physical Exam:    BP 126/86    Pulse 73    Ht 5\' 7"  (1.702 m)    Wt 143 lb (64.9 kg)    SpO2 98%    BMI 22.40 kg/m  Filed Weights   06/22/21 1125  Weight: 143 lb  (64.9 kg)   Gen: awake, alert, NAD HEENT: anicteric, no pallor CV: RRR, no mrg Pulm: CTA b/l Abd: soft, NT/ND, +BS throughout Ext: no c/c/e Neuro: nonfocal   Carmell Austria, MD 06/22/2021, 11:36 AM  Cc: Mateo Flow, MD

## 2021-06-27 DIAGNOSIS — M5412 Radiculopathy, cervical region: Secondary | ICD-10-CM | POA: Diagnosis not present

## 2021-06-27 DIAGNOSIS — M533 Sacrococcygeal disorders, not elsewhere classified: Secondary | ICD-10-CM | POA: Diagnosis not present

## 2021-06-27 DIAGNOSIS — M961 Postlaminectomy syndrome, not elsewhere classified: Secondary | ICD-10-CM | POA: Diagnosis not present

## 2021-06-27 DIAGNOSIS — G894 Chronic pain syndrome: Secondary | ICD-10-CM | POA: Diagnosis not present

## 2021-06-28 DIAGNOSIS — D0439 Carcinoma in situ of skin of other parts of face: Secondary | ICD-10-CM | POA: Insufficient documentation

## 2021-06-28 DIAGNOSIS — C4491 Basal cell carcinoma of skin, unspecified: Secondary | ICD-10-CM | POA: Insufficient documentation

## 2021-06-28 HISTORY — DX: Carcinoma in situ of skin of other parts of face: D04.39

## 2021-06-28 HISTORY — DX: Basal cell carcinoma of skin, unspecified: C44.91

## 2021-07-04 ENCOUNTER — Encounter: Payer: Self-pay | Admitting: Gastroenterology

## 2021-07-06 DIAGNOSIS — M5412 Radiculopathy, cervical region: Secondary | ICD-10-CM | POA: Diagnosis not present

## 2021-07-06 DIAGNOSIS — C44719 Basal cell carcinoma of skin of left lower limb, including hip: Secondary | ICD-10-CM | POA: Diagnosis not present

## 2021-07-06 DIAGNOSIS — C44329 Squamous cell carcinoma of skin of other parts of face: Secondary | ICD-10-CM | POA: Diagnosis not present

## 2021-07-06 DIAGNOSIS — M5413 Radiculopathy, cervicothoracic region: Secondary | ICD-10-CM | POA: Diagnosis not present

## 2021-07-18 ENCOUNTER — Encounter: Payer: Self-pay | Admitting: Gastroenterology

## 2021-07-18 ENCOUNTER — Ambulatory Visit (AMBULATORY_SURGERY_CENTER): Payer: PPO | Admitting: Gastroenterology

## 2021-07-18 ENCOUNTER — Other Ambulatory Visit: Payer: Self-pay

## 2021-07-18 VITALS — BP 127/98 | HR 73 | Temp 97.5°F | Resp 12 | Ht 67.0 in | Wt 143.0 lb

## 2021-07-18 DIAGNOSIS — Z1211 Encounter for screening for malignant neoplasm of colon: Secondary | ICD-10-CM | POA: Diagnosis not present

## 2021-07-18 DIAGNOSIS — R1310 Dysphagia, unspecified: Secondary | ICD-10-CM

## 2021-07-18 DIAGNOSIS — Z800 Family history of malignant neoplasm of digestive organs: Secondary | ICD-10-CM | POA: Diagnosis not present

## 2021-07-18 DIAGNOSIS — K319 Disease of stomach and duodenum, unspecified: Secondary | ICD-10-CM | POA: Diagnosis not present

## 2021-07-18 DIAGNOSIS — K2970 Gastritis, unspecified, without bleeding: Secondary | ICD-10-CM

## 2021-07-18 DIAGNOSIS — K580 Irritable bowel syndrome with diarrhea: Secondary | ICD-10-CM | POA: Diagnosis not present

## 2021-07-18 DIAGNOSIS — F419 Anxiety disorder, unspecified: Secondary | ICD-10-CM | POA: Diagnosis not present

## 2021-07-18 DIAGNOSIS — F32A Depression, unspecified: Secondary | ICD-10-CM | POA: Diagnosis not present

## 2021-07-18 MED ORDER — DEXLANSOPRAZOLE 60 MG PO CPDR: 60.0000 mg | DELAYED_RELEASE_CAPSULE | Freq: Every day | ORAL | 3 refills | Status: DC

## 2021-07-18 MED ORDER — DEXLANSOPRAZOLE 60 MG PO CPDR: 60.0000 mg | DELAYED_RELEASE_CAPSULE | Freq: Every day | ORAL | 4 refills | Status: DC

## 2021-07-18 MED ORDER — SODIUM CHLORIDE 0.9 % IV SOLN: 500.0000 mL | Freq: Once | INTRAVENOUS | Status: DC

## 2021-07-18 NOTE — Op Note (Signed)
[name] ?Patient Name: Lisa [name] ?Procedure Date: 07/18/2021 3:17 PM ?MRN: [id number] ?Endoscopist: [name] , MD ?Age: 51 ?Referring MD:  ?Date of Birth: January 05, 1971 ?Gender: Female ?Account #: [id number] ?Procedure:                Upper GI endoscopy ?Indications:              Dysphagia. H/O EoE in past ?Medicines:                Monitored Anesthesia Care ?Procedure:                Pre-Anesthesia Assessment: ?                          - Prior to the procedure, a History and Physical  ?                          was performed, and patient medications and  ?                          allergies were reviewed. The patient's tolerance of  ?                          previous anesthesia was also reviewed. The risks  ?                          and benefits of the procedure and the sedation  ?                          options and risks were discussed with the patient.  ?                          All questions were answered, and informed consent  ?                          was obtained. Prior Anticoagulants: The patient has  ?                          taken no previous anticoagulant or antiplatelet  ?                          agents. ASA Grade Assessment: II - A patient with  ?                          mild systemic disease. After reviewing the risks  ?                          and benefits, the patient was deemed in  ?                          satisfactory condition to undergo the procedure. ?                          After obtaining informed consent, the endoscope was  ?  passed under direct vision. Throughout the  ?                          procedure, the patient's blood pressure, pulse, and  ?                          oxygen saturations were monitored continuously. The  ?                          Endoscope was introduced through the mouth, and  ?                          advanced to the second part of duodenum. The upper  ?                          GI endoscopy was accomplished  without difficulty.  ?                          The patient tolerated the procedure well. ?Scope In: ?Scope Out: ?Findings:                 The examined esophagus was normal except for few  ?                          superficial linear furrows. No strictures. Biopsies  ?                          were obtained from the proximal and distal  ?                          esophagus with cold forceps for histology of  ?                          suspected eosinophilic esophagitis. The scope was  ?                          withdrawn. Dilation was performed with a Venia Minks  ?                          dilator with mild resistance at 74 Fr and 50 Fr. ?                          Diffuse mild inflammation characterized by erythema  ?                          was found in the entire examined stomach. Biopsies  ?                          were taken with a cold forceps for histology from  ?                          the pylorus/antrum/angularis and from body of the  ?  stomach/fundus/cardia in separate containers.  ?                          Jodi Mourning protocol). ?                          The examined duodenum was normal. ?Complications:            No immediate complications. ?Estimated Blood Loss:     Estimated blood loss: none. ?Impression:               - Few linear furrows. Biopsied. Dilated. ?                          - Gastritis. Biopsied. ?                          - Normal examined duodenum. ?Recommendation:           - Patient has a contact number available for  ?                          emergencies. The signs and symptoms of potential  ?                          delayed complications were discussed with the  ?                          patient. Return to normal activities tomorrow.  ?                          Written discharge instructions were provided to the  ?                          patient. ?                          - Post dilatation diet. ?                          - Continue present medications  including Dexilant  ?                          60 mg p.o. once a day ?                          - Await pathology results. ?                          - The findings and recommendations were discussed  ?                          with the patient's family. ?[name], MD ?07/18/2021 4:05:32 PM ?This report has been signed electronically. ?

## 2021-07-18 NOTE — Op Note (Signed)
[facility] ?Patient Name: Lisa [name] ?Procedure Date: 07/18/2021 3:16 PM ?MRN: [id number] ?Endoscopist: [name] , MD ?Age: 51 ?Referring MD:  ?Date of Birth: 11/15/1970 ?Gender: Female ?Account #: [id number] ?Procedure:                Colonoscopy ?Indications:              Screening in patient at increased risk: Colorectal  ?                          cancer in mother age 51 ?Medicines:                Monitored Anesthesia Care ?Procedure:                Pre-Anesthesia Assessment: ?                          - Prior to the procedure, a History and Physical  ?                          was performed, and patient medications and  ?                          allergies were reviewed. The patient's tolerance of  ?                          previous anesthesia was also reviewed. The risks  ?                          and benefits of the procedure and the sedation  ?                          options and risks were discussed with the patient.  ?                          All questions were answered, and informed consent  ?                          was obtained. Prior Anticoagulants: The patient has  ?                          taken no previous anticoagulant or antiplatelet  ?                          agents. ASA Grade Assessment: II - A patient with  ?                          mild systemic disease. After reviewing the risks  ?                          and benefits, the patient was deemed in  ?                          satisfactory condition to undergo the procedure. ?  After obtaining informed consent, the colonoscope  ?                          was passed under direct vision. Throughout the  ?                          procedure, the patient's blood pressure, pulse, and  ?                          oxygen saturations were monitored continuously. The  ?                          Olympus PCF-H190DL (#[id number]) Colonoscope was  ?                          introduced through the anus and advanced  to the 2  ?                          cm into the ileum. The colonoscopy was performed  ?                          without difficulty. The patient tolerated the  ?                          procedure well. The quality of the bowel  ?                          preparation was adequate to identify polyps. The  ?                          terminal ileum, ileocecal valve, appendiceal  ?                          orifice, and rectum were photographed. ?Scope In: 3:36:26 PM ?Scope Out: 3:57:28 PM ?Scope Withdrawal Time: 0 hours 10 minutes 54 seconds  ?Total Procedure Duration: 0 hours 21 minutes 2 seconds  ?Findings:                 The colon (entire examined portion) appeared  ?                          normal. The colon was highly redundant. ?                          Non-bleeding internal hemorrhoids were found during  ?                          retroflexion. The hemorrhoids were small and Grade  ?                          I (internal hemorrhoids that do not prolapse). ?                          The terminal ileum appeared normal. ?  The exam was otherwise without abnormality on  ?                          direct and retroflexion views. ?Complications:            No immediate complications. ?Estimated Blood Loss:     Estimated blood loss: none. ?Impression:               - The entire examined colon is normal. ?                          - Non-bleeding internal hemorrhoids. ?                          - The examined portion of the ileum was normal. ?                          - The examination was otherwise normal on direct  ?                          and retroflexion views. ?                          - No specimens collected. ?Recommendation:           - Patient has a contact number available for  ?                          emergencies. The signs and symptoms of potential  ?                          delayed complications were discussed with the  ?                          patient. Return to normal  activities tomorrow.  ?                          Written discharge instructions were provided to the  ?                          patient. ?                          - Resume previous diet. ?                          - Continue present medications. ?                          - Repeat colonoscopy in 5 years for screening  ?                          purposes. Earlier, if with any new problems or  ?                          change in family history. ?                          -  The findings and recommendations were discussed  ?                          with the patient's family. ?[name], MD ?07/18/2021 4:08:41 PM ?This report has been signed electronically. ?

## 2021-07-18 NOTE — Progress Notes (Signed)
Called to room to assist during endoscopic procedure.  Patient ID and intended procedure confirmed with present staff. Received instructions for my participation in the procedure from the performing physician.  

## 2021-07-18 NOTE — Progress Notes (Signed)
? ? ?Chief Complaint: FU ? ?Referring Provider:  Mateo Flow, MD  ,   ? ? ?ASSESSMENT AND PLAN;  ? ?#1.  GERD with occ dysphgia. H/O EoE in past. ? ?#2.  IBS-C ? ?#3.  FH of colon cancer (mom at age 52).  ? ?#4.  EoE (on EGD 02/2017), neg SB Bx for celiac.  Asymptomatic. ? ?Plan: ? ?-Dexilant '60mg'$  po QD #90, 4RF ?-pepcid '40mg'$  po QHS #90 4RF ?-EGD with eso Bx and dil/colon with miralax  ?-Bentyl '10mg'$  po BID prn #60, 2 RF ?-Dr Laurelyn Sickle office - blood test results. ? ? ? ?I discussed EGD/Colonoscopy- the indications, risks, alternatives and potential complications including, but not limited to bleeding, infection, reaction to meds, damage to internal organs, cardiac and/or pulmonary problems, and perforation requiring surgery. The possibility that significant findings could be missed was explained. All ? were answered. Pt consents to proceed. ? ?HPI:   ? ?Lisa [name] is a 51 y.o. female  ?For follow-up visit ? ?Unfortunately had COVID second time from which she has recovered.  Unfortunately, this did result in increased GI symptoms including heartburn, regurgitation with occasional dysphagia.  She is better with Dexilant in the morning and Pepcid at night.  No odynophagia.  No fever chills or night sweats. ? ?Continues to have constipation mostly with occasional diarrhea.  Has been on MiraLAX 17 g p.o. once a day the days when she is constipated.  She had few episodes of rectal bleeding in December 2022 which has resolved now. ? ?Seen by Dr. Drue Novel blood work performed.  Records are awaited. ? ?She is due for repeat colonoscopy and would like to get EGD done at the same time. ? ?No weight loss.  No jaundice dark urine or pale stools. ? ?From previous notes: ?-Adm to [facility] for acute respiratory failure with hypoxia d/t COVID-19 pneumonia requiring oxygen.  Treated with steroids/oxygen/did not require intubation.  Started having increasing liver function tests.  Remdesivir was stopped.  Ultrasound abdomen neg.  Negative acute hepatitis panel.  Note that she is s/p cholecystectomy.  Subsequently LFTs normalized. ? ?Husband Dellis Filbert did not have COVID-19.  Unfortunately, Aanshi's mom had COVID-19.  Has recovered. ? ?Wt Readings from Last 3 Encounters:  ?07/18/21 143 lb (64.9 kg)  ?06/22/21 143 lb (64.9 kg)  ?09/29/19 136 lb (61.7 kg)  ? ?Past GI procedures: ?Colonoscopy 08/17/2016 ([facility]): Small internal hemorrhoids.  Otherwise normal to TI.  Repeat in 5 years d/t FH of colon cancer (mom) ? ?EGD 02/2017: ?-Gastritis ?-Eso bx with increased eosinophils consistent with eosinophilic esophagitis (>20 HPF) ?-Negative small bowel biopsies for celiac. ? ?-S/P laparoscopic cholecystectomy 2003 d/t biliary dyskinesia. ?-S/p laparoscopy 1992 due to chronic pelvic pain/endometriosis, multiple laparoscopic surgeries. ?-Back surgery x2 ? ? ?Past Medical History:  ?Diagnosis Date  ? Acute respiratory disease due to COVID-19 virus 04/17/2019  ? Anxiety   ? Bursitis 07/12/2015  ? Chronic back pain   ? Chronic constipation   ? Chronic interstitial cystitis 11/12/2016  ? DDD (degenerative disc disease), cervical 01/02/2019  ? MRI from February 01, 2017 showed mild degenerative changes.  ? DDD (degenerative disc disease), lumbar 01/02/2019  ? Status post fusion in 2009 and 2010  ? Depression   ? Eosinophilic esophagitis   ? FH: colon cancer   ? GERD (gastroesophageal reflux disease)   ? Hiatal hernia   ? History of Clostridioides difficile infection 01/02/2019  ? History of endometriosis 01/02/2019  ? History of gastroesophageal reflux (GERD) 01/02/2019  ?  History of migraine 01/02/2019  ? Hx of Clostridium difficile infection   ? IBS (irritable bowel syndrome)   ? Interstitial cystitis 01/02/2019  ? Major depressive disorder, single episode, unspecified 06/09/2013  ? Myofascial pain 06/04/2018  ? Post laminectomy syndrome 10/09/2013  ? Primary osteoarthritis of both hands 01/02/2019  ? Clinical and radiographic findings are consistent with osteoarthritis.  All  autoimmune work-up was negative.  Use of Voltaren gel and natural anti-inflammatorie  ? SI (sacroiliac) pain 09/09/2015  ? ? ?Past Surgical History:  ?Procedure Laterality Date  ? CHOLECYSTECTOMY  2002  ? COLONOSCOPY  08/17/2016  ? Internal hemorrhoids.   ? ESOPHAGOGASTRODUODENOSCOPY  03/01/2017  ? Gastritis.   ? LUMBAR FUSION    ? 2009, 2010, 2011  ? PLACEMENT OF BREAST IMPLANTS    ? ? ?Family History  ?Problem Relation Age of Onset  ? Colon cancer Mother   ?     took 3 feet of colon   ? Rheumatic fever Mother   ?     age 48-13.5  ? Alcoholism Father   ? Alcoholism Brother   ? Hypertension Brother   ? Heart attack Maternal Aunt   ? Stomach cancer Maternal Aunt   ? Liver cancer Maternal Aunt   ? Pancreatic cancer Maternal Aunt   ? Heart attack Maternal Uncle   ? Lung cancer Maternal Uncle   ? Kidney cancer Maternal Uncle   ? Leukemia Paternal Aunt   ? Esophageal cancer Neg Hx   ? Rectal cancer Neg Hx   ? ? ?Social History  ? ?Tobacco Use  ? Smoking status: Former  ? Smokeless tobacco: Never  ? Tobacco comments:  ?  quit 15 years ago like 2004  ?Vaping Use  ? Vaping Use: Never used  ?Substance Use Topics  ? Alcohol use: Never  ? Drug use: Never  ? ? ?Current Outpatient Medications  ?Medication Sig Dispense Refill  ? albuterol (VENTOLIN HFA) 108 (90 Base) MCG/ACT inhaler Inhale 2 puffs into the lungs every 4 (four) hours as needed.    ? bimatoprost (LATISSE) 0.03 % ophthalmic solution Apply 1 drop to eye at bedtime.    ? Calcium Carbonate Antacid (TUMS PO) Take 1 tablet by mouth as needed.    ? COMBIVENT RESPIMAT 20-100 MCG/ACT AERS respimat Inhale 1 puff into the lungs every 6 (six) hours as needed.    ? dextromethorphan-guaiFENesin (MUCINEX DM) 30-600 MG 12hr tablet Take 1 tablet by mouth 2 (two) times daily. 30 tablet 0  ? famotidine (PEPCID) 40 MG tablet Take 1 tablet (40 mg total) by mouth daily. 90 tablet 4  ? FLOVENT HFA 110 MCG/ACT inhaler Inhale 2 puffs into the lungs daily as needed (shortness of breath).   3   ? HYDROcodone-acetaminophen (NORCO) 10-325 MG tablet Take 1 tablet by mouth every 4 (four) hours as needed.     ? Melatonin 10 MG CAPS Take 30 mg by mouth at bedtime.     ? methocarbamol (ROBAXIN) 750 MG tablet Take 750 mg by mouth 3 (three) times daily.     ? metoprolol succinate (TOPROL-XL) 25 MG 24 hr tablet TAKE 1 TABLET(25 MG) BY MOUTH DAILY 90 tablet 3  ? morphine (MS CONTIN) 30 MG 12 hr tablet Take 30 mg by mouth 3 (three) times daily.     ? polyethylene glycol (MIRALAX / GLYCOLAX) packet Take 17 g by mouth as needed.     ? dexlansoprazole (DEXILANT) 60 MG capsule TAKE 1 CAPSULE(60 MG) BY MOUTH  DAILY 30 capsule 6  ? dexlansoprazole (DEXILANT) 60 MG capsule TAKE 1 CAPSULE(60 MG) BY MOUTH DAILY 90 capsule 4  ? diclofenac (VOLTAREN) 75 MG EC tablet TAKE 1 TABLET(75 MG) BY MOUTH TWICE DAILY 180 tablet 0  ? dicyclomine (BENTYL) 10 MG capsule Take 1 capsule (10 mg total) by mouth 2 (two) times daily as needed for spasms. 60 capsule 2  ? metoprolol tartrate (LOPRESSOR) 25 MG tablet TAKE 1 TABLET BY MOUTH DAILY AS NEEDED FOR RAPID HEART RATE 90 tablet 1  ? ondansetron (ZOFRAN-ODT) 4 MG disintegrating tablet Take 4 mg by mouth every 6 (six) hours as needed for nausea/vomiting.    ? pentosan polysulfate (ELMIRON) 100 MG capsule Take 100 mg by mouth as needed (IC flareup).     ? rizatriptan (MAXALT) 10 MG tablet 1 tablet as needed.    ? ?Current Facility-Administered Medications  ?Medication Dose Route Frequency Provider Last Rate Last Admin  ? 0.9 %  sodium chloride infusion  500 mL Intravenous Once [name], MD      ? triamcinolone acetonide (KENALOG) 10 MG/ML injection 10 mg  10 mg Other Once Landis Martins, DPM      ? ? ?Allergies  ?Allergen Reactions  ? Azithromycin Other (See Comments)  ?  Unknown ?  ? Latex Other (See Comments)  ?  Unknown ?  ? Metaxalone Itching  ? Shellfish Allergy   ? ? ?Review of Systems:  ?Has anxiety/depression ?Has multiple allergies-seen by Dr. Neldon Mc ?Chronic back pain requiring  narcotics ?Headaches ?Fatigue ? ?  ? ?Physical Exam:   ? ?BP 108/72   Pulse 83   Temp (!) 97.5 ?F (36.4 ?C)   Ht '5\' 7"'$  (1.702 m)   Wt 143 lb (64.9 kg)   SpO2 98%   BMI 22.40 kg/m?  ?Filed Weights  ? 07/18/21

## 2021-07-18 NOTE — Progress Notes (Signed)
To pacu, VSS. Report to [email or web address] 

## 2021-07-18 NOTE — Patient Instructions (Signed)
YOU HAD AN ENDOSCOPIC PROCEDURE TODAY AT THE [facility]:   Refer to the procedure report that was given to you for any specific questions about what was found during the examination.  If the procedure report does not answer your questions, please call your gastroenterologist to clarify.  If you requested that your care partner not be given the details of your procedure findings, then the procedure report has been included in a sealed envelope for you to review at your convenience later.  YOU SHOULD EXPECT: Some feelings of bloating in the abdomen. Passage of more gas than usual.  Walking can help get rid of the air that was put into your GI tract during the procedure and reduce the bloating. If you had a lower endoscopy (such as a colonoscopy or flexible sigmoidoscopy) you may notice spotting of blood in your stool or on the toilet paper. If you underwent a bowel prep for your procedure, you may not have a normal bowel movement for a few days.  Please Note:  You might notice some irritation and congestion in your nose or some drainage.  This is from the oxygen used during your procedure.  There is no need for concern and it should clear up in a day or so.  SYMPTOMS TO REPORT IMMEDIATELY:   Following lower endoscopy (colonoscopy or flexible sigmoidoscopy):  Excessive amounts of blood in the stool  Significant tenderness or worsening of abdominal pains  Swelling of the abdomen that is new, acute  Fever of 100F or higher   Following upper endoscopy (EGD)  Vomiting of blood or coffee ground material  New chest pain or pain under the shoulder blades  Painful or persistently difficult swallowing  New shortness of breath  Fever of 100F or higher  Black, tarry-looking stools  For urgent or emergent issues, a gastroenterologist can be reached at any hour by calling [phone]. Do not use MyChart messaging for urgent concerns.    DIET:  We do recommend a small meal at first, but  then you may proceed to your regular diet.  Drink plenty of fluids but you should avoid alcoholic beverages for 24 hours.  ACTIVITY:  You should plan to take it easy for the rest of today and you should NOT DRIVE or use heavy machinery until tomorrow (because of the sedation medicines used during the test).    FOLLOW UP: Our staff will call the number listed on your records 48-72 hours following your procedure to check on you and address any questions or concerns that you may have regarding the information given to you following your procedure. If we do not reach you, we will leave a message.  We will attempt to reach you two times.  During this call, we will ask if you have developed any symptoms of COVID 19. If you develop any symptoms (ie: fever, flu-like symptoms, shortness of breath, cough etc.) before then, please call [phone].  If you test positive for Covid 19 in the 2 weeks post procedure, please call and report this information to us.    If any biopsies were taken you will be contacted by phone or by letter within the next 1-3 weeks.  Please call us at [phone] if you have not heard about the biopsies in 3 weeks.    SIGNATURES/CONFIDENTIALITY: You and/or your care partner have signed paperwork which will be entered into your electronic medical record.  These signatures attest to the fact that that the information above on   your After Visit Summary has been reviewed and is understood.  Full responsibility of the confidentiality of this discharge information lies with you and/or your care-partner. 

## 2021-07-18 NOTE — Progress Notes (Signed)
I have reviewed the patient's medical history in detail and updated the computerized patient record.

## 2021-07-19 ENCOUNTER — Other Ambulatory Visit: Payer: Self-pay | Admitting: Plastic Surgery

## 2021-07-19 DIAGNOSIS — C44719 Basal cell carcinoma of skin of left lower limb, including hip: Secondary | ICD-10-CM | POA: Diagnosis not present

## 2021-07-20 ENCOUNTER — Telehealth: Payer: Self-pay

## 2021-07-20 ENCOUNTER — Telehealth: Payer: Self-pay | Admitting: *Deleted

## 2021-07-20 NOTE — Telephone Encounter (Signed)
?  Follow up Call-Voicemail left.  ?

## 2021-07-20 NOTE — Telephone Encounter (Signed)
?  Follow up Call- ? ?Call back number 07/18/2021  ?Post procedure Call Back phone  # [phone]  ?Permission to leave phone message Yes  ?Some recent data might be hidden  ?  ? ?Patient questions: ? ?Do you have a fever, pain , or abdominal swelling? No. ?Pain Score  0 * ? ?Have you tolerated food without any problems? Yes.   ? ?Have you been able to return to your normal activities? Yes.   ? ?Do you have any questions about your discharge instructions: ?Diet   No. ?Medications  No. ?Follow up visit  No. ? ?Do you have questions or concerns about your Care? No. ? ?Actions: ?* If pain score is 4 or above: ?No action needed, pain <4. ? ? ?

## 2021-07-27 ENCOUNTER — Encounter: Payer: Self-pay | Admitting: Gastroenterology

## 2021-07-30 ENCOUNTER — Encounter: Payer: Self-pay | Admitting: Gastroenterology

## 2021-08-10 ENCOUNTER — Telehealth: Payer: Self-pay | Admitting: Gastroenterology

## 2021-08-10 MED ORDER — DEXLANSOPRAZOLE 60 MG PO CPDR: 60.0000 mg | DELAYED_RELEASE_CAPSULE | Freq: Two times a day (BID) | ORAL | 4 refills | Status: DC

## 2021-08-10 MED ORDER — DICYCLOMINE HCL 10 MG PO CAPS: 10.0000 mg | ORAL_CAPSULE | Freq: Four times a day (QID) | ORAL | 1 refills | Status: DC | PRN

## 2021-08-10 NOTE — Telephone Encounter (Signed)
Done

## 2021-08-10 NOTE — Telephone Encounter (Signed)
Inbound call from patient requesting medication refill for Dexilant 2x a day and Bentyl sent to [facility] in [place] on [address] ?

## 2021-08-24 DIAGNOSIS — M5412 Radiculopathy, cervical region: Secondary | ICD-10-CM | POA: Diagnosis not present

## 2021-08-24 DIAGNOSIS — M47812 Spondylosis without myelopathy or radiculopathy, cervical region: Secondary | ICD-10-CM | POA: Diagnosis not present

## 2021-08-24 DIAGNOSIS — M25512 Pain in left shoulder: Secondary | ICD-10-CM | POA: Diagnosis not present

## 2021-08-24 DIAGNOSIS — M5410 Radiculopathy, site unspecified: Secondary | ICD-10-CM | POA: Diagnosis not present

## 2021-08-24 DIAGNOSIS — M6281 Muscle weakness (generalized): Secondary | ICD-10-CM | POA: Diagnosis not present

## 2021-08-24 DIAGNOSIS — M5125 Other intervertebral disc displacement, thoracolumbar region: Secondary | ICD-10-CM | POA: Diagnosis not present

## 2021-09-07 ENCOUNTER — Telehealth: Payer: Self-pay | Admitting: Gastroenterology

## 2021-09-07 NOTE — Telephone Encounter (Signed)
PA per covermymeds isnt needed. Will call insurance to see ?

## 2021-09-07 NOTE — Telephone Encounter (Signed)
Patient made aware that peer to peer will need to wait until doctor is back in office and she voiced understanding ?

## 2021-09-07 NOTE — Telephone Encounter (Signed)
Patient called to let us know she is unable to pick up dexilant medication because it needs prior authorization. Please advise. ?

## 2021-09-07 NOTE — Telephone Encounter (Signed)
Spoke with insurance company. Said that medication is a tier 4 medication which doesn't require a PA. The 2 times a day will need one.  ? ? ?Currently with insurance. She wants 2 times a day for PA ? ? ?Insurance is requesting a Peer to Peer for this medication. They will fax peer form. I will inform pt regarding her request..... ?

## 2021-09-13 DIAGNOSIS — D0439 Carcinoma in situ of skin of other parts of face: Secondary | ICD-10-CM | POA: Diagnosis not present

## 2021-09-13 DIAGNOSIS — Z9889 Other specified postprocedural states: Secondary | ICD-10-CM

## 2021-09-13 DIAGNOSIS — M952 Other acquired deformity of head: Secondary | ICD-10-CM | POA: Insufficient documentation

## 2021-09-13 HISTORY — DX: Other specified postprocedural states: Z98.890

## 2021-09-18 NOTE — Telephone Encounter (Signed)
Patient is aware auth for 2 times a day didn't go through. She understands and voiced understanding ?

## 2021-09-19 DIAGNOSIS — M961 Postlaminectomy syndrome, not elsewhere classified: Secondary | ICD-10-CM | POA: Diagnosis not present

## 2021-09-19 DIAGNOSIS — Z79899 Other long term (current) drug therapy: Secondary | ICD-10-CM

## 2021-09-19 DIAGNOSIS — M5412 Radiculopathy, cervical region: Secondary | ICD-10-CM | POA: Diagnosis not present

## 2021-09-19 HISTORY — DX: Other long term (current) drug therapy: Z79.899

## 2021-09-29 DIAGNOSIS — L209 Atopic dermatitis, unspecified: Secondary | ICD-10-CM | POA: Diagnosis not present

## 2021-09-29 DIAGNOSIS — J029 Acute pharyngitis, unspecified: Secondary | ICD-10-CM | POA: Diagnosis not present

## 2021-09-29 DIAGNOSIS — R051 Acute cough: Secondary | ICD-10-CM | POA: Diagnosis not present

## 2021-10-05 DIAGNOSIS — H6691 Otitis media, unspecified, right ear: Secondary | ICD-10-CM | POA: Diagnosis not present

## 2021-10-23 DIAGNOSIS — Z79899 Other long term (current) drug therapy: Secondary | ICD-10-CM | POA: Diagnosis not present

## 2021-10-23 DIAGNOSIS — M961 Postlaminectomy syndrome, not elsewhere classified: Secondary | ICD-10-CM | POA: Diagnosis not present

## 2021-10-23 DIAGNOSIS — G894 Chronic pain syndrome: Secondary | ICD-10-CM | POA: Diagnosis not present

## 2021-10-23 DIAGNOSIS — M5412 Radiculopathy, cervical region: Secondary | ICD-10-CM | POA: Diagnosis not present

## 2021-10-27 DIAGNOSIS — M5126 Other intervertebral disc displacement, lumbar region: Secondary | ICD-10-CM | POA: Diagnosis not present

## 2021-10-27 DIAGNOSIS — M961 Postlaminectomy syndrome, not elsewhere classified: Secondary | ICD-10-CM | POA: Diagnosis not present

## 2021-10-27 DIAGNOSIS — M4807 Spinal stenosis, lumbosacral region: Secondary | ICD-10-CM | POA: Diagnosis not present

## 2021-10-27 DIAGNOSIS — M5021 Other cervical disc displacement,  high cervical region: Secondary | ICD-10-CM | POA: Diagnosis not present

## 2021-10-27 DIAGNOSIS — M5412 Radiculopathy, cervical region: Secondary | ICD-10-CM | POA: Diagnosis not present

## 2021-11-01 DIAGNOSIS — G8929 Other chronic pain: Secondary | ICD-10-CM | POA: Diagnosis not present

## 2021-11-01 DIAGNOSIS — M7061 Trochanteric bursitis, right hip: Secondary | ICD-10-CM | POA: Diagnosis not present

## 2021-11-01 DIAGNOSIS — M533 Sacrococcygeal disorders, not elsewhere classified: Secondary | ICD-10-CM | POA: Diagnosis not present

## 2021-11-01 DIAGNOSIS — M961 Postlaminectomy syndrome, not elsewhere classified: Secondary | ICD-10-CM | POA: Diagnosis not present

## 2021-11-01 DIAGNOSIS — M47816 Spondylosis without myelopathy or radiculopathy, lumbar region: Secondary | ICD-10-CM | POA: Diagnosis not present

## 2021-11-01 DIAGNOSIS — M62838 Other muscle spasm: Secondary | ICD-10-CM | POA: Diagnosis not present

## 2021-11-20 ENCOUNTER — Other Ambulatory Visit: Payer: Self-pay | Admitting: Cardiology

## 2021-11-30 ENCOUNTER — Other Ambulatory Visit: Payer: Self-pay | Admitting: Cardiology

## 2021-12-18 DIAGNOSIS — M7061 Trochanteric bursitis, right hip: Secondary | ICD-10-CM | POA: Diagnosis not present

## 2021-12-21 DIAGNOSIS — N3091 Cystitis, unspecified with hematuria: Secondary | ICD-10-CM | POA: Diagnosis not present

## 2021-12-21 DIAGNOSIS — R300 Dysuria: Secondary | ICD-10-CM | POA: Diagnosis not present

## 2021-12-26 DIAGNOSIS — Z1231 Encounter for screening mammogram for malignant neoplasm of breast: Secondary | ICD-10-CM | POA: Diagnosis not present

## 2021-12-26 DIAGNOSIS — N952 Postmenopausal atrophic vaginitis: Secondary | ICD-10-CM | POA: Diagnosis not present

## 2021-12-26 DIAGNOSIS — Z01419 Encounter for gynecological examination (general) (routine) without abnormal findings: Secondary | ICD-10-CM | POA: Diagnosis not present

## 2021-12-26 DIAGNOSIS — Z6823 Body mass index (BMI) 23.0-23.9, adult: Secondary | ICD-10-CM | POA: Diagnosis not present

## 2021-12-27 DIAGNOSIS — L700 Acne vulgaris: Secondary | ICD-10-CM | POA: Diagnosis not present

## 2021-12-27 DIAGNOSIS — L600 Ingrowing nail: Secondary | ICD-10-CM | POA: Diagnosis not present

## 2021-12-28 DIAGNOSIS — M533 Sacrococcygeal disorders, not elsewhere classified: Secondary | ICD-10-CM | POA: Diagnosis not present

## 2021-12-28 DIAGNOSIS — M47816 Spondylosis without myelopathy or radiculopathy, lumbar region: Secondary | ICD-10-CM | POA: Insufficient documentation

## 2021-12-28 DIAGNOSIS — M961 Postlaminectomy syndrome, not elsewhere classified: Secondary | ICD-10-CM | POA: Diagnosis not present

## 2021-12-28 DIAGNOSIS — G894 Chronic pain syndrome: Secondary | ICD-10-CM | POA: Diagnosis not present

## 2021-12-28 DIAGNOSIS — G43909 Migraine, unspecified, not intractable, without status migrainosus: Secondary | ICD-10-CM | POA: Diagnosis not present

## 2021-12-28 HISTORY — DX: Spondylosis without myelopathy or radiculopathy, lumbar region: M47.816

## 2022-01-01 ENCOUNTER — Other Ambulatory Visit: Payer: Self-pay | Admitting: Plastic Surgery

## 2022-01-01 DIAGNOSIS — L570 Actinic keratosis: Secondary | ICD-10-CM | POA: Diagnosis not present

## 2022-01-01 DIAGNOSIS — D485 Neoplasm of uncertain behavior of skin: Secondary | ICD-10-CM | POA: Diagnosis not present

## 2022-01-17 DIAGNOSIS — L570 Actinic keratosis: Secondary | ICD-10-CM

## 2022-01-17 HISTORY — DX: Actinic keratosis: L57.0

## 2022-01-25 DIAGNOSIS — R300 Dysuria: Secondary | ICD-10-CM | POA: Diagnosis not present

## 2022-01-25 DIAGNOSIS — Z6824 Body mass index (BMI) 24.0-24.9, adult: Secondary | ICD-10-CM | POA: Diagnosis not present

## 2022-01-25 DIAGNOSIS — H6991 Unspecified Eustachian tube disorder, right ear: Secondary | ICD-10-CM | POA: Diagnosis not present

## 2022-01-31 ENCOUNTER — Other Ambulatory Visit: Payer: Self-pay | Admitting: Cardiology

## 2022-02-12 ENCOUNTER — Other Ambulatory Visit: Payer: Self-pay | Admitting: Cardiology

## 2022-02-14 ENCOUNTER — Other Ambulatory Visit: Payer: Self-pay | Admitting: Cardiology

## 2022-02-14 ENCOUNTER — Telehealth: Payer: Self-pay | Admitting: Cardiology

## 2022-02-14 MED ORDER — METOPROLOL SUCCINATE ER 25 MG PO TB24: 25.0000 mg | ORAL_TABLET | Freq: Every day | ORAL | 0 refills | Status: DC

## 2022-02-14 NOTE — Telephone Encounter (Signed)
Refill of Metoprolol Succinate 25 mg sent to [facility].

## 2022-02-14 NOTE — Telephone Encounter (Signed)
*  STAT* If patient is at the pharmacy, call can be transferred to refill team.   1. Which medications need to be refilled? (please list name of each medication and dose if known)   metoprolol succinate (TOPROL-XL) 25 MG 24 hr tablet  2. Which pharmacy/location (including street and city if local pharmacy) is medication to be sent to?  [facility] [place], [place]  3. Do they need a 30 day or 90 day supply? 90 day  Patient stated she only has 3 tablets left.  Patient has appointment scheduled on 04/12/22.

## 2022-02-26 DIAGNOSIS — K219 Gastro-esophageal reflux disease without esophagitis: Secondary | ICD-10-CM | POA: Diagnosis not present

## 2022-02-26 DIAGNOSIS — N958 Other specified menopausal and perimenopausal disorders: Secondary | ICD-10-CM | POA: Diagnosis not present

## 2022-02-26 DIAGNOSIS — E039 Hypothyroidism, unspecified: Secondary | ICD-10-CM | POA: Diagnosis not present

## 2022-02-26 DIAGNOSIS — M816 Localized osteoporosis [Lequesne]: Secondary | ICD-10-CM | POA: Diagnosis not present

## 2022-02-26 DIAGNOSIS — Z13820 Encounter for screening for osteoporosis: Secondary | ICD-10-CM | POA: Diagnosis not present

## 2022-02-28 DIAGNOSIS — J301 Allergic rhinitis due to pollen: Secondary | ICD-10-CM | POA: Diagnosis not present

## 2022-02-28 DIAGNOSIS — J4540 Moderate persistent asthma, uncomplicated: Secondary | ICD-10-CM | POA: Diagnosis not present

## 2022-02-28 DIAGNOSIS — G4700 Insomnia, unspecified: Secondary | ICD-10-CM | POA: Diagnosis not present

## 2022-02-28 DIAGNOSIS — M961 Postlaminectomy syndrome, not elsewhere classified: Secondary | ICD-10-CM | POA: Diagnosis not present

## 2022-02-28 DIAGNOSIS — R498 Other voice and resonance disorders: Secondary | ICD-10-CM | POA: Diagnosis not present

## 2022-02-28 DIAGNOSIS — G43909 Migraine, unspecified, not intractable, without status migrainosus: Secondary | ICD-10-CM | POA: Diagnosis not present

## 2022-02-28 DIAGNOSIS — L570 Actinic keratosis: Secondary | ICD-10-CM | POA: Diagnosis not present

## 2022-03-01 DIAGNOSIS — Z0000 Encounter for general adult medical examination without abnormal findings: Secondary | ICD-10-CM | POA: Diagnosis not present

## 2022-03-01 DIAGNOSIS — Z6825 Body mass index (BMI) 25.0-25.9, adult: Secondary | ICD-10-CM | POA: Diagnosis not present

## 2022-03-01 DIAGNOSIS — M76891 Other specified enthesopathies of right lower limb, excluding foot: Secondary | ICD-10-CM | POA: Diagnosis not present

## 2022-03-06 DIAGNOSIS — M5412 Radiculopathy, cervical region: Secondary | ICD-10-CM | POA: Diagnosis not present

## 2022-03-12 DIAGNOSIS — M810 Age-related osteoporosis without current pathological fracture: Secondary | ICD-10-CM | POA: Diagnosis not present

## 2022-03-16 DIAGNOSIS — J4540 Moderate persistent asthma, uncomplicated: Secondary | ICD-10-CM | POA: Diagnosis not present

## 2022-03-16 DIAGNOSIS — G4700 Insomnia, unspecified: Secondary | ICD-10-CM | POA: Diagnosis not present

## 2022-03-16 DIAGNOSIS — R498 Other voice and resonance disorders: Secondary | ICD-10-CM | POA: Diagnosis not present

## 2022-03-16 DIAGNOSIS — J301 Allergic rhinitis due to pollen: Secondary | ICD-10-CM | POA: Diagnosis not present

## 2022-03-22 DIAGNOSIS — G8929 Other chronic pain: Secondary | ICD-10-CM | POA: Diagnosis not present

## 2022-03-22 DIAGNOSIS — M533 Sacrococcygeal disorders, not elsewhere classified: Secondary | ICD-10-CM | POA: Diagnosis not present

## 2022-03-22 DIAGNOSIS — Z79899 Other long term (current) drug therapy: Secondary | ICD-10-CM | POA: Diagnosis not present

## 2022-03-22 DIAGNOSIS — M5412 Radiculopathy, cervical region: Secondary | ICD-10-CM | POA: Diagnosis not present

## 2022-03-22 DIAGNOSIS — M961 Postlaminectomy syndrome, not elsewhere classified: Secondary | ICD-10-CM | POA: Diagnosis not present

## 2022-03-22 DIAGNOSIS — M47816 Spondylosis without myelopathy or radiculopathy, lumbar region: Secondary | ICD-10-CM | POA: Diagnosis not present

## 2022-03-30 ENCOUNTER — Encounter (HOSPITAL_COMMUNITY): Payer: Self-pay

## 2022-03-30 ENCOUNTER — Emergency Department (HOSPITAL_COMMUNITY): Payer: PPO

## 2022-03-30 ENCOUNTER — Emergency Department (HOSPITAL_COMMUNITY)
Admission: EM | Admit: 2022-03-30 | Discharge: 2022-03-30 | Disposition: A | Discharge status: Home / Self Care | Payer: PPO | Attending: Emergency Medicine | Admitting: Emergency Medicine

## 2022-03-30 ENCOUNTER — Other Ambulatory Visit: Payer: Self-pay

## 2022-03-30 DIAGNOSIS — W108XXA Fall (on) (from) other stairs and steps, initial encounter: Secondary | ICD-10-CM | POA: Diagnosis not present

## 2022-03-30 DIAGNOSIS — S92014A Nondisplaced fracture of body of right calcaneus, initial encounter for closed fracture: Secondary | ICD-10-CM | POA: Diagnosis not present

## 2022-03-30 DIAGNOSIS — Y9301 Activity, walking, marching and hiking: Secondary | ICD-10-CM | POA: Insufficient documentation

## 2022-03-30 DIAGNOSIS — M25571 Pain in right ankle and joints of right foot: Secondary | ICD-10-CM | POA: Insufficient documentation

## 2022-03-30 DIAGNOSIS — S92001A Unspecified fracture of right calcaneus, initial encounter for closed fracture: Secondary | ICD-10-CM | POA: Insufficient documentation

## 2022-03-30 DIAGNOSIS — S99911A Unspecified injury of right ankle, initial encounter: Secondary | ICD-10-CM | POA: Diagnosis present

## 2022-03-30 MED ORDER — HYDROMORPHONE HCL 1 MG/ML IJ SOLN
1.0000 mg | Freq: Once | INTRAMUSCULAR | Status: AC
  Administered 2022-03-30: 1 mg via INTRAMUSCULAR
  Filled 2022-03-30: qty 1

## 2022-03-30 MED ORDER — OXYCODONE-ACETAMINOPHEN 5-325 MG PO TABS: 1.0000 | ORAL_TABLET | Freq: Four times a day (QID) | ORAL | 0 refills | Status: DC | PRN

## 2022-03-30 MED ORDER — HYDROMORPHONE HCL 1 MG/ML IJ SOLN
2.0000 mg | Freq: Once | INTRAMUSCULAR | Status: AC
  Administered 2022-03-30: 2 mg via INTRAMUSCULAR
  Filled 2022-03-30: qty 2

## 2022-03-30 NOTE — ED Triage Notes (Signed)
Pt arrived POV from home after falling down 10 steps. Pt is c/o right ankle pain. Pt denies hitting her head or LOC.

## 2022-03-30 NOTE — ED Provider Triage Note (Signed)
Emergency Medicine Provider Triage Evaluation Note  Lisa [name] , a 51 y.o. female  was evaluated in triage.  Pt complains of right ankle pain starting acutely about 45 minutes ago.  Patient fell down around 10 stairs.  She twisted her ankle.  Denies head or neck injury.  Unable to bear weight..  Review of Systems  Positive: Ankle pain and swelling Negative: Knee pain  Physical Exam  Ht '5\' 7"'$  (1.702 m)   Wt 68 kg   BMI 23.49 kg/m  Gen:   Awake, no distress   Resp:  Normal effort  MSK:   Moves extremities without difficulty  Other:  Swelling noted over the lateral malleolus, associated tenderness, patient is able to wiggle her toes  Medical Decision Making  Medically screening exam initiated at 2:18 PM.  Appropriate orders placed.  Lisa [name] was informed that the remainder of the evaluation will be completed by another provider, this initial triage assessment does not replace that evaluation, and the importance of remaining in the ED until their evaluation is complete.    Carlisle Cater, PA-C 03/30/22 1421

## 2022-03-30 NOTE — Discharge Instructions (Addendum)
You have a calcaneal fracture.  Please use the splint and do not bear weight on it.  Please use crutches.  I have prescribed you several days of Percocet.  You need to call your pain management doctor on Monday to let them know about your ED visit  Please call orthopedic doctor next week for follow-up  Return to ER if you have worse ankle pain, toes turning blue

## 2022-03-30 NOTE — ED Provider Notes (Signed)
[facility] EMERGENCY DEPARTMENT Provider Note   CSN: [id number] Arrival date & time: 03/30/22  1410     History  Chief Complaint  Patient presents with   Ankle Pain    Lisa [name] is a 51 y.o. female history of chronic back pain here presenting with right ankle injury.  Patient states that she was coming down the stairs and missed a step and fell down 10 steps.  She states that she twisted her right ankle and has severe pain unable to bear weight afterwards.  Denies any head injury or other injuries.  The history is provided by the patient.       Home Medications Prior to Admission medications   Medication Sig Start Date End Date Taking? Authorizing Provider  albuterol (VENTOLIN HFA) 108 (90 Base) MCG/ACT inhaler Inhale 2 puffs into the lungs every 4 (four) hours as needed. 04/04/19   [provider]  bimatoprost (LATISSE) 0.03 % ophthalmic solution Apply 1 drop to eye at bedtime. 06/12/19   [provider]  Calcium Carbonate Antacid (TUMS PO) Take 1 tablet by mouth as needed.    [provider]  COMBIVENT RESPIMAT 20-100 MCG/ACT AERS respimat Inhale 1 puff into the lungs every 6 (six) hours as needed. 07/08/19   [provider]  dexlansoprazole (DEXILANT) 60 MG capsule TAKE 1 CAPSULE(60 MG) BY MOUTH DAILY 06/22/21   [name], MD  dexlansoprazole (DEXILANT) 60 MG capsule Take 1 capsule (60 mg total) by mouth daily. 07/18/21   [name], MD  dexlansoprazole (DEXILANT) 60 MG capsule Take 1 capsule (60 mg total) by mouth 2 (two) times daily. 08/10/21   [name], MD  dextromethorphan-guaiFENesin [facility] DM) 30-600 MG 12hr tablet Take 1 tablet by mouth 2 (two) times daily. 04/21/19   Mercy Riding, MD  diclofenac (VOLTAREN) 75 MG EC tablet TAKE 1 TABLET(75 MG) BY MOUTH TWICE DAILY 10/21/17   Landis Martins, DPM  dicyclomine (BENTYL) 10 MG capsule Take 1 capsule (10 mg total) by mouth 4 (four) times daily as needed for  spasms. 08/10/21   [name], MD  famotidine (PEPCID) 40 MG tablet Take 1 tablet (40 mg total) by mouth daily. 06/22/21   [name], MD  FLOVENT HFA 110 MCG/ACT inhaler Inhale 2 puffs into the lungs daily as needed (shortness of breath).  03/05/17   [provider]  HYDROcodone-acetaminophen (NORCO) 10-325 MG tablet Take 1 tablet by mouth every 4 (four) hours as needed.  11/16/16   [provider]  Melatonin 10 MG CAPS Take 30 mg by mouth at bedtime.     [provider]  methocarbamol (ROBAXIN) 750 MG tablet Take 750 mg by mouth 3 (three) times daily.  04/10/16   [provider]  metoprolol succinate (TOPROL-XL) 25 MG 24 hr tablet Take 1 tablet (25 mg total) by mouth daily. Patient must keep appointment for 04/12/22 for further refills. 3 rd/final attempt 02/14/22   Richardo Priest, MD  metoprolol tartrate (LOPRESSOR) 25 MG tablet Take 1 tablet (25 mg total) by mouth daily as needed (rapid heart rate). Patient needs an appointment for further refills. 2 nd attempt 01/31/22   Richardo Priest, MD  morphine (MS CONTIN) 30 MG 12 hr tablet Take 30 mg by mouth 3 (three) times daily.  11/16/16   [provider]  ondansetron (ZOFRAN-ODT) 4 MG disintegrating tablet Take 4 mg by mouth every 6 (six) hours as needed for nausea/vomiting. 04/12/19   [provider]  pentosan polysulfate (ELMIRON) 100 MG capsule Take 100 mg by mouth as needed (IC flareup).  06/22/16   [provider]  polyethylene glycol (MIRALAX / GLYCOLAX) packet Take 17 g by mouth as needed.     [provider]  rizatriptan (MAXALT) 10 MG tablet 1 tablet as needed. 04/27/19   [provider]      Allergies    Azithromycin, Latex, Metaxalone, and Shellfish allergy    Review of Systems   Review of Systems  Musculoskeletal:        Right ankle pain  All other systems reviewed and are negative.   Physical Exam Updated Vital Signs BP (!) 170/90 (BP Location: Left  Arm)   Pulse (!) 131   Temp 98.7 F (37.1 C)   Resp 16   Ht '5\' 7"'$  (1.702 m)   Wt 68 kg   SpO2 100%   BMI 23.49 kg/m  Physical Exam Vitals and nursing note reviewed.  Constitutional:      Appearance: Normal appearance.  HENT:     Head: Normocephalic and atraumatic.     Comments: No signs of head trauma    Nose: Nose normal.     Mouth/Throat:     Mouth: Mucous membranes are moist.  Eyes:     Extraocular Movements: Extraocular movements intact.     Pupils: Pupils are equal, round, and reactive to light.  Cardiovascular:     Rate and Rhythm: Normal rate and regular rhythm.     Pulses: Normal pulses.     Heart sounds: Normal heart sounds.  Pulmonary:     Effort: Pulmonary effort is normal.     Breath sounds: Normal breath sounds.  Abdominal:     General: Abdomen is flat.     Palpations: Abdomen is soft.  Musculoskeletal:     Cervical back: Normal range of motion and neck supple.     Comments: No spinal tenderness and no hip tenderness.  Patient does have obvious right ankle swelling and pain and tenderness.  Patient has 2+ right DP pulse.  Neurological:     General: No focal deficit present.     Mental Status: She is alert and oriented to person, place, and time.  Psychiatric:        Mood and Affect: Mood normal.        Behavior: Behavior normal.     ED Results / Procedures / Treatments   Labs (all labs ordered are listed, but only abnormal results are displayed) Labs Reviewed - No data to display  EKG None  Radiology CT ANKLE RIGHT WO CONTRAST  Result Date: 03/30/2022 CLINICAL DATA:  Evaluate calcaneal fracture. EXAM: CT OF THE RIGHT ANKLE WITHOUT CONTRAST TECHNIQUE: Multidetector CT imaging of the right ankle was performed according to the standard protocol. Multiplanar CT image reconstructions were also generated. RADIATION DOSE REDUCTION: This exam was performed according to the departmental dose-optimization program which includes automated exposure control,  adjustment of the mA and/or kV according to patient size and/or use of iterative reconstruction technique. COMPARISON:  Radiographs, same date. FINDINGS: Complex comminuted central depression type calcaneal fracture with significant impaction and rotation of the posterior calcaneal facet. There is also a comminuted fracture through the base of the sustentacular limb. No fracture of the anterior process. The talus is intact. The tibiotalar joint is maintained. The midfoot bony structures are intact. Extensive subcutaneous soft tissue swelling/edema/hemorrhage associated with the fracture. Grossly by CT the major tendons and ligaments are intact. IMPRESSION: 1. Complex comminuted  central depression type calcaneal fracture with significant impaction and rotation of the posterior calcaneal facet. 2. Comminuted fracture through the base of the sustentacular limb. 3. Extensive subcutaneous soft tissue swelling/edema/hemorrhage associated with the fracture. Electronically Signed   By: Marijo Sanes M.D.   On: 03/30/2022 18:57   DG Ankle Complete Right  Result Date: 03/30/2022 CLINICAL DATA:  Fall with right ankle pain. EXAM: RIGHT ANKLE - COMPLETE 3+ VIEW COMPARISON:  None Available. FINDINGS: Comminuted fracture of the mid calcaneus is best appreciated on the lateral projection. No other acute fracture evident. No evidence for dislocation of the ankle. Soft tissue swelling noted. IMPRESSION: Comminuted fracture of the mid calcaneus. Electronically Signed   By: Misty Stanley M.D.   On: 03/30/2022 14:57    Procedures Procedures    Medications Ordered in ED Medications  HYDROmorphone (DILAUDID) injection 2 mg (has no administration in time range)  HYDROmorphone (DILAUDID) injection 1 mg (1 mg Intramuscular Given 03/30/22 1653)    ED Course/ Medical Decision Making/ A&P                           Medical Decision Making Lisa [name] is a 51 y.o. female here with fall with right ankle pain.  Patient has  right ankle swelling so we will get x-rays to rule out fracture.  Patient has no other signs of trauma.  Patient does have chronic pain and is on chronic pain meds  7:24 PM Patient's x-ray showed calcaneal fracture.  I discussed case with Dr. Zachery Dakins.  He recommended CT scan which also showed a calcaneal fracture.  He reviewed the CT scan and recommended short leg splint and crutches.  He will follow-up with patient next week.  Of note, patient is on morphine and Norco at home.  She states that since this is a holiday weekend, her pain contract would not be avoided if I prescribe her Percocet.  I told her to call her pain doctor on Monday.  Problems Addressed: Closed nondisplaced fracture of right calcaneus, unspecified portion of calcaneus, initial encounter: acute illness or injury  Amount and/or Complexity of Data Reviewed Radiology: ordered and independent interpretation performed. Decision-making details documented in ED Course.  Risk Prescription drug management.    Final Clinical Impression(s) / ED Diagnoses Final diagnoses:  None    Rx / DC Orders ED Discharge Orders     None         Drenda Freeze, MD 03/30/22 2022

## 2022-03-30 NOTE — Progress Notes (Signed)
Orthopedic Tech Progress Note Patient Details:  DELETHA JAFFEE 1971/01/04 [id number]  Ortho Devices Type of Ortho Device: Crutches, Short leg splint Ortho Device/Splint Location: RLE Ortho Device/Splint Interventions: Ordered, Application, Adjustment   Post Interventions Patient Tolerated: Well Instructions Provided: Adjustment of device, Care of device, Poper ambulation with device  Leanda Padmore L Jory Welke 03/30/2022, 7:41 PM

## 2022-04-03 DIAGNOSIS — S92001A Unspecified fracture of right calcaneus, initial encounter for closed fracture: Secondary | ICD-10-CM | POA: Diagnosis not present

## 2022-04-03 DIAGNOSIS — M79671 Pain in right foot: Secondary | ICD-10-CM | POA: Diagnosis not present

## 2022-04-10 DIAGNOSIS — F419 Anxiety disorder, unspecified: Secondary | ICD-10-CM | POA: Insufficient documentation

## 2022-04-10 DIAGNOSIS — Z800 Family history of malignant neoplasm of digestive organs: Secondary | ICD-10-CM | POA: Insufficient documentation

## 2022-04-10 DIAGNOSIS — K5909 Other constipation: Secondary | ICD-10-CM | POA: Insufficient documentation

## 2022-04-10 DIAGNOSIS — K200 Eosinophilic esophagitis: Secondary | ICD-10-CM | POA: Insufficient documentation

## 2022-04-10 DIAGNOSIS — F32A Depression, unspecified: Secondary | ICD-10-CM | POA: Insufficient documentation

## 2022-04-12 ENCOUNTER — Ambulatory Visit: Payer: PPO | Admitting: Cardiology

## 2022-04-13 DIAGNOSIS — M810 Age-related osteoporosis without current pathological fracture: Secondary | ICD-10-CM | POA: Diagnosis not present

## 2022-04-14 DIAGNOSIS — M810 Age-related osteoporosis without current pathological fracture: Secondary | ICD-10-CM

## 2022-04-14 HISTORY — DX: Age-related osteoporosis without current pathological fracture: M81.0

## 2022-04-16 ENCOUNTER — Other Ambulatory Visit: Payer: Self-pay | Admitting: Cardiology

## 2022-04-16 NOTE — Telephone Encounter (Signed)
Rx refill sent to pharmacy. 

## 2022-04-16 NOTE — Telephone Encounter (Signed)
Left message on voice mail for pt to get refills of medication from PCP. Left call back number if any questions.

## 2022-04-17 DIAGNOSIS — M961 Postlaminectomy syndrome, not elsewhere classified: Secondary | ICD-10-CM | POA: Diagnosis not present

## 2022-04-17 DIAGNOSIS — M47816 Spondylosis without myelopathy or radiculopathy, lumbar region: Secondary | ICD-10-CM | POA: Diagnosis not present

## 2022-04-17 DIAGNOSIS — M533 Sacrococcygeal disorders, not elsewhere classified: Secondary | ICD-10-CM | POA: Diagnosis not present

## 2022-04-17 DIAGNOSIS — G8929 Other chronic pain: Secondary | ICD-10-CM | POA: Diagnosis not present

## 2022-04-17 DIAGNOSIS — S92001A Unspecified fracture of right calcaneus, initial encounter for closed fracture: Secondary | ICD-10-CM | POA: Diagnosis not present

## 2022-05-08 DIAGNOSIS — S92001A Unspecified fracture of right calcaneus, initial encounter for closed fracture: Secondary | ICD-10-CM | POA: Diagnosis not present

## 2022-05-15 ENCOUNTER — Other Ambulatory Visit: Payer: Self-pay | Admitting: Cardiology

## 2022-05-15 NOTE — Telephone Encounter (Signed)
Needs appointment for Refills or need to be requested from patients pcp per Dr Bettina Gavia

## 2022-05-16 ENCOUNTER — Other Ambulatory Visit: Payer: Self-pay | Admitting: Gastroenterology

## 2022-06-01 DIAGNOSIS — S92001A Unspecified fracture of right calcaneus, initial encounter for closed fracture: Secondary | ICD-10-CM | POA: Diagnosis not present

## 2022-06-14 DIAGNOSIS — Z7689 Persons encountering health services in other specified circumstances: Secondary | ICD-10-CM | POA: Diagnosis not present

## 2022-06-14 DIAGNOSIS — R300 Dysuria: Secondary | ICD-10-CM | POA: Diagnosis not present

## 2022-06-14 DIAGNOSIS — S92002A Unspecified fracture of left calcaneus, initial encounter for closed fracture: Secondary | ICD-10-CM | POA: Diagnosis not present

## 2022-06-19 DIAGNOSIS — S92001A Unspecified fracture of right calcaneus, initial encounter for closed fracture: Secondary | ICD-10-CM | POA: Diagnosis not present

## 2022-06-22 DIAGNOSIS — G8929 Other chronic pain: Secondary | ICD-10-CM | POA: Diagnosis not present

## 2022-06-22 DIAGNOSIS — M533 Sacrococcygeal disorders, not elsewhere classified: Secondary | ICD-10-CM | POA: Diagnosis not present

## 2022-06-22 DIAGNOSIS — M961 Postlaminectomy syndrome, not elsewhere classified: Secondary | ICD-10-CM | POA: Diagnosis not present

## 2022-06-22 DIAGNOSIS — M47816 Spondylosis without myelopathy or radiculopathy, lumbar region: Secondary | ICD-10-CM | POA: Diagnosis not present

## 2022-06-22 DIAGNOSIS — M5412 Radiculopathy, cervical region: Secondary | ICD-10-CM | POA: Diagnosis not present

## 2022-07-03 DIAGNOSIS — N898 Other specified noninflammatory disorders of vagina: Secondary | ICD-10-CM | POA: Diagnosis not present

## 2022-07-03 DIAGNOSIS — N390 Urinary tract infection, site not specified: Secondary | ICD-10-CM | POA: Diagnosis not present

## 2022-07-10 DIAGNOSIS — M25561 Pain in right knee: Secondary | ICD-10-CM | POA: Diagnosis not present

## 2022-07-11 DIAGNOSIS — L650 Telogen effluvium: Secondary | ICD-10-CM | POA: Diagnosis not present

## 2022-07-11 DIAGNOSIS — L700 Acne vulgaris: Secondary | ICD-10-CM | POA: Diagnosis not present

## 2022-07-11 DIAGNOSIS — L570 Actinic keratosis: Secondary | ICD-10-CM | POA: Diagnosis not present

## 2022-07-18 DIAGNOSIS — M25561 Pain in right knee: Secondary | ICD-10-CM | POA: Diagnosis not present

## 2022-07-20 DIAGNOSIS — M25561 Pain in right knee: Secondary | ICD-10-CM | POA: Diagnosis not present

## 2022-07-23 NOTE — Progress Notes (Signed)
Surgery orders requested via Epic inbox. °

## 2022-07-23 NOTE — Progress Notes (Addendum)
COVID Vaccine Completed:  Date of COVID positive in last 90 days:  PCP - Bertram Millard, MD Cardiologist - Shirlee More, MD (last OV 2021)  Chest x-ray -  EKG -  Stress Test - 03-18-17 [facility] ECHO - 09-17-19 Epic Cardiac Cath -  Pacemaker/ICD device last checked: Spinal Cord Stimulator:  Bowel Prep -   Sleep Study -  CPAP -   Fasting Blood Sugar -  Checks Blood Sugar _____ times a day  Last dose of GLP1 agonist-  N/A GLP1 instructions:  N/A   Last dose of SGLT-2 inhibitors-  N/A SGLT-2 instructions: N/A   Blood Thinner Instructions: Aspirin Instructions:  ASA 81 Last Dose:  Activity level:  Can go up a flight of stairs and perform activities of daily living without stopping and without symptoms of chest pain or shortness of breath.  Able to exercise without symptoms  Unable to go up a flight of stairs without symptoms of     Anesthesia review:  Shortness of breath evaluated by cardiology  Patient denies shortness of breath, fever, cough and chest pain at PAT appointment  Patient verbalized understanding of instructions that were given to them at the PAT appointment. Patient was also instructed that they will need to review over the PAT instructions again at home before surgery.

## 2022-07-23 NOTE — Patient Instructions (Signed)
SURGICAL WAITING ROOM VISITATION Patients having surgery or a procedure may have no more than 2 support people in the waiting area - these visitors may rotate.    If the patient needs to stay at the hospital during part of their recovery, the visitor guidelines for inpatient rooms apply. Pre-op nurse will coordinate an appropriate time for 1 support person to accompany patient in pre-op.  This support person may not rotate.    Please refer to the [facility] website for the visitor guidelines for Inpatients (after your surgery is over and you are in a regular room).   Due to an increase in RSV and influenza rates and associated hospitalizations, children ages 51 and under may not visit patients in [place].     Your procedure is scheduled on: 08-01-22   Report to [facility] Main Entrance    Report to admitting at 9:35 AM   Call this number if you have problems the morning of surgery [phone]   Do not eat food :After Midnight.   After Midnight you may have the following liquids until 8:30 AM DAY OF SURGERY  Water Non-Citrus Juices (without pulp, NO RED) Carbonated Beverages Black Coffee (NO MILK/CREAM OR CREAMERS, sugar ok)  Clear Tea (NO MILK/CREAM OR CREAMERS, sugar ok) regular and decaf                             Plain Jell-O (NO RED)                                           Fruit ices (not with fruit pulp, NO RED)                                     Popsicles (NO RED)                                                               Sports drinks like Gatorade (NO RED)                   The day of surgery:  Drink ONE (1) Pre-Surgery Clear Ensure at 8:30 AM the morning of surgery. Drink in one sitting. Do not sip.  This drink was given to you during your hospital  pre-op appointment visit. Nothing else to drink after completing the Pre-Surgery Clear Ensure .          If you have questions, please contact your surgeon's office.   FOLLOW  ANY  ADDITIONAL PRE OP INSTRUCTIONS YOU RECEIVED FROM YOUR SURGEON'S OFFICE!!!     Oral Hygiene is also important to reduce your risk of infection.                                    Remember - BRUSH YOUR TEETH THE MORNING OF SURGERY WITH YOUR REGULAR TOOTHPASTE   Do NOT smoke after Midnight   Take these medicines the morning of surgery with A SIP OF WATER:  Dexilant  Pepcid  Metoprolol  Elmiron  Hydrocodone if needed  Maxalt if needed                              You may not have any metal on your body including hair pins, jewelry, and body piercing             Do not wear make-up, lotions, powders, perfumes or deodorant  Do not wear nail polish including gel and S&S, artificial/acrylic nails, or any other type of covering on natural nails including finger and toenails. If you have artificial nails, gel coating, etc. that needs to be removed by a nail salon please have this removed prior to surgery or surgery may need to be canceled/ delayed if the surgeon/ anesthesia feels like they are unable to be safely monitored.   Do not shave  48 hours prior to surgery.    Do not bring valuables to the hospital. [place].   Contacts, dentures or bridgework may not be worn into surgery.   Bring small overnight bag day of surgery.   DO NOT [place]. PHARMACY WILL DISPENSE MEDICATIONS LISTED ON YOUR MEDICATION LIST TO YOU DURING YOUR ADMISSION [facility]!    Patients discharged on the day of surgery will not be allowed to drive home.  Someone NEEDS to stay with you for the first 24 hours after anesthesia.   Special Instructions: Bring a copy of your healthcare power of attorney and living will documents the day of surgery if you haven't scanned them before.              Please read over the following fact sheets you were given: IF [place]   If  you received a COVID test during your pre-op visit  it is requested that you wear a mask when out in public, stay away from anyone that may not be feeling well and notify your surgeon if you develop symptoms. If you test positive for Covid or have been in contact with anyone that has tested positive in the last 10 days please notify you surgeon.  [place] - Preparing for Surgery Before surgery, you can play an important role.  Because skin is not sterile, your skin needs to be as free of germs as possible.  You can reduce the number of germs on your skin by washing with CHG (chlorahexidine gluconate) soap before surgery.  CHG is an antiseptic cleaner which kills germs and bonds with the skin to continue killing germs even after washing. Please DO NOT use if you have an allergy to CHG or antibacterial soaps.  If your skin becomes reddened/irritated stop using the CHG and inform your nurse when you arrive at Short Stay. Do not shave (including legs and underarms) for at least 48 hours prior to the first CHG shower.  You may shave your face/neck.  Please follow these instructions carefully:  1.  Shower with CHG Soap the night before surgery and the  morning of surgery.  2.  If you choose to wash your hair, wash your hair first as usual with your normal  shampoo.  3.  After you shampoo, rinse your hair and body thoroughly to remove the shampoo.  4.  Use CHG as you would any other liquid soap.  You can apply chg directly to the skin and wash.  Gently with a scrungie or clean washcloth.  5.  Apply the CHG Soap to your body ONLY FROM THE NECK DOWN.   Do   not use on face/ open                           Wound or open sores. Avoid contact with eyes, ears mouth and   genitals (private parts).                       Wash face,  Genitals (private parts) with your normal soap.             6.  Wash thoroughly, paying special attention to the area where your    surgery  will be  performed.  7.  Thoroughly rinse your body with warm water from the neck down.  8.  DO NOT shower/wash with your normal soap after using and rinsing off the CHG Soap.                9.  Pat yourself dry with a clean towel.            10.  Wear clean pajamas.            11.  Place clean sheets on your bed the night of your first shower and do not  sleep with pets. Day of Surgery : Do not apply any lotions/deodorants the morning of surgery.  Please wear clean clothes to the hospital/surgery center.  FAILURE TO FOLLOW THESE INSTRUCTIONS MAY RESULT IN THE CANCELLATION OF YOUR SURGERY  PATIENT SIGNATURE_________________________________  NURSE SIGNATURE__________________________________  ________________________________________________________________________

## 2022-07-25 ENCOUNTER — Other Ambulatory Visit: Payer: Self-pay

## 2022-07-25 MED ORDER — FAMOTIDINE 40 MG PO TABS: 40.0000 mg | ORAL_TABLET | Freq: Every day | ORAL | 1 refills | Status: DC

## 2022-07-26 ENCOUNTER — Other Ambulatory Visit: Payer: Self-pay

## 2022-07-26 ENCOUNTER — Encounter (HOSPITAL_COMMUNITY)
Admission: RE | Admit: 2022-07-26 | Discharge: 2022-07-26 | Disposition: A | Discharge status: Home / Self Care | Payer: PPO | Source: Ambulatory Visit | Attending: Orthopaedic Surgery | Admitting: Orthopaedic Surgery

## 2022-07-26 ENCOUNTER — Encounter (HOSPITAL_COMMUNITY): Payer: Self-pay

## 2022-07-26 VITALS — BP 122/54 | HR 98 | Temp 98.4°F | Resp 16 | Ht 67.0 in | Wt 141.2 lb

## 2022-07-26 DIAGNOSIS — D649 Anemia, unspecified: Secondary | ICD-10-CM | POA: Insufficient documentation

## 2022-07-26 DIAGNOSIS — I10 Essential (primary) hypertension: Secondary | ICD-10-CM

## 2022-07-26 DIAGNOSIS — I2510 Atherosclerotic heart disease of native coronary artery without angina pectoris: Secondary | ICD-10-CM | POA: Insufficient documentation

## 2022-07-26 DIAGNOSIS — Z01818 Encounter for other preprocedural examination: Secondary | ICD-10-CM | POA: Diagnosis not present

## 2022-07-26 HISTORY — DX: Headache, unspecified: R51.9

## 2022-07-26 HISTORY — DX: Nausea with vomiting, unspecified: R11.2

## 2022-07-26 HISTORY — DX: Unspecified malignant neoplasm of skin, unspecified: C44.90

## 2022-07-26 HISTORY — DX: Other specified postprocedural states: Z98.890

## 2022-07-26 HISTORY — DX: Personal history of urinary calculi: Z87.442

## 2022-07-26 HISTORY — DX: Atherosclerotic heart disease of native coronary artery without angina pectoris: I25.10

## 2022-07-26 HISTORY — DX: Pneumonia, unspecified organism: J18.9

## 2022-07-26 HISTORY — DX: Nausea with vomiting, unspecified: Z98.890

## 2022-07-26 HISTORY — DX: Anemia, unspecified: D64.9

## 2022-07-26 LAB — BASIC METABOLIC PANEL
Anion gap: 8 (ref 5–15)
BUN: 16 mg/dL (ref 6–20)
CO2: 23 mmol/L (ref 22–32)
Calcium: 9.2 mg/dL (ref 8.9–10.3)
Chloride: 107 mmol/L (ref 98–111)
Creatinine, Ser: 0.61 mg/dL (ref 0.44–1.00)
GFR, Estimated: >60 mL/min (ref >60)
Glucose, Bld: 109 mg/dL — ABNORMAL HIGH (ref 70–99)
Potassium: 4.1 mmol/L (ref 3.5–5.1)
Sodium: 138 mmol/L (ref 135–145)

## 2022-07-26 LAB — CBC
HCT: 39.2 % (ref 36.0–46.0)
Hemoglobin: 12.9 g/dL (ref 12.0–15.0)
MCH: 31.9 pg (ref 26.0–34.0)
MCHC: 32.9 g/dL (ref 30.0–36.0)
MCV: 96.8 fL (ref 80.0–100.0)
Platelets: 325 10*3/uL (ref 150–400)
RBC: 4.05 MIL/uL (ref 3.87–5.11)
RDW: 13.7 % (ref 11.5–15.5)
WBC: 4 10*3/uL (ref 4.0–10.5)
nRBC: 0 % (ref 0.0–0.2)

## 2022-07-26 NOTE — H&P (Signed)
PREOPERATIVE H&P  Chief Complaint: right knee meniscus tear, distal femoral fracture  HPI: Lisa [name] is a 52 y.o. female who is scheduled for, Procedure(s): KNEE ARTHROSCOPY WITH MEDIAL MENISECTOMY.   This is a 52 year old female who comes in to the clinic to be evaluated for right knee pain.  She unfortunately suffered an injury around Thanksgiving when she fell going down some stairs.  She had a high impact fall on her right foot and suffered a calcaneus fracture.  She treated that nonoperatively and was nonweightbearing for a couple months.  Now she is getting back into weightbearing, and as she is walking she notes that her right knee is weak and giving out.  She has sharp shooting pain at the medial joint line of the right knee.  It clicks and catches with walking.  Internally feels unstable.  It is also made worse with weightbearing, and feels better with nonweightbearing.    Symptoms are rated as moderate to severe, and have been worsening.  This is significantly impairing activities of daily living.    Please see clinic note for further details on this patient's care.    She has elected for surgical management.   Past Medical History:  Diagnosis Date   Acute respiratory disease due to COVID-19 virus 04/17/2019   Anemia    Anxiety    Bursitis 07/12/2015   Chronic back pain    Chronic constipation    Chronic interstitial cystitis 11/12/2016   Coronary artery disease    DDD (degenerative disc disease), cervical 01/02/2019   MRI from February 01, 2017 showed mild degenerative changes.   DDD (degenerative disc disease), lumbar 01/02/2019   Status post fusion in 2009 and 2010   Depression    Eosinophilic esophagitis    FH: colon cancer    GERD (gastroesophageal reflux disease)    Headache    Hiatal hernia    History of Clostridioides difficile infection 01/02/2019   History of endometriosis 01/02/2019   History of gastroesophageal reflux (GERD) 01/02/2019    History of kidney stones    History of migraine 01/02/2019   Hx of Clostridium difficile infection    IBS (irritable bowel syndrome)    Interstitial cystitis 01/02/2019   Major depressive disorder, single episode, unspecified 06/09/2013   Myofascial pain 06/04/2018   Pneumonia    PONV (postoperative nausea and vomiting)    Post laminectomy syndrome 10/09/2013   Primary osteoarthritis of both hands 01/02/2019   Clinical and radiographic findings are consistent with osteoarthritis.  All autoimmune work-up was negative.  Use of Voltaren gel and natural anti-inflammatorie   SI (sacroiliac) pain 09/09/2015   Skin cancer    Past Surgical History:  Procedure Laterality Date   CHOLECYSTECTOMY  2002   COLONOSCOPY  08/17/2016   Internal hemorrhoids.    ESOPHAGOGASTRODUODENOSCOPY  03/01/2017   Gastritis.    LAPAROSCOPY     x8   LUMBAR FUSION     2009, 2010, 2011   PLACEMENT OF BREAST IMPLANTS     Social History   Socioeconomic History   Marital status: Married    Spouse name: Not on file   Number of children: 0   Years of education: Not on file   Highest education level: Not on file  Occupational History   Occupation: disable  Tobacco Use   Smoking status: Former    Packs/day: 1.00    Years: 15.00    Additional pack years: 0.00    Total pack years: 15.00  Types: Cigarettes   Smokeless tobacco: Never   Tobacco comments:    quit 15 years ago like 2004  Vaping Use   Vaping Use: Never used  Substance and Sexual Activity   Alcohol use: Never   Drug use: Never   Sexual activity: Not on file  Other Topics Concern   Not on file  Social History Narrative   Not on file   Social Determinants of Health   Financial Resource Strain: Not on file  Food Insecurity: Not on file  Transportation Needs: Not on file  Physical Activity: Not on file  Stress: Not on file  Social Connections: Not on file   Family History  Problem Relation Age of Onset   Colon cancer Mother         took 3 feet of colon    Rheumatic fever Mother        age 60-13.5   Alcoholism Father    Alcoholism Brother    Hypertension Brother    Heart attack Maternal Aunt    Stomach cancer Maternal Aunt    Liver cancer Maternal Aunt    Pancreatic cancer Maternal Aunt    Heart attack Maternal Uncle    Lung cancer Maternal Uncle    Kidney cancer Maternal Uncle    Leukemia Paternal Aunt    Esophageal cancer Neg Hx    Rectal cancer Neg Hx    Allergies  Allergen Reactions   Shellfish Allergy Anaphylaxis    Throat closes    Azithromycin Other (See Comments)    C-diff   Metaxalone Itching   Tape     Blisters skin   Prior to Admission medications   Medication Sig Start Date End Date Taking? Authorizing Provider  Acetaminophen (MIDOL PO) Take 2 tablets by mouth daily as needed (pain).   Yes [provider]  aspirin 81 MG chewable tablet Chew 81 mg by mouth 2 (two) times daily. 06/22/22  Yes [provider]  Calcium Carbonate Antacid (TUMS PO) Take 1 tablet by mouth as needed (acid reflux).   Yes [provider]  COMBIVENT RESPIMAT 20-100 MCG/ACT AERS respimat Inhale 1 puff into the lungs every 6 (six) hours as needed for shortness of breath or wheezing. 07/08/19  Yes [provider]  dexlansoprazole (DEXILANT) 60 MG capsule Take 1 capsule (60 mg total) by mouth daily. Patient taking differently: Take 60 mg by mouth daily as needed (IBS flare). 07/18/21  Yes [name], MD  dicyclomine (BENTYL) 10 MG capsule TAKE 1 CAPSULE(10 MG) BY MOUTH FOUR TIMES DAILY AS NEEDED FOR SPASMS 05/17/22  Yes [name], MD  HYDROcodone-acetaminophen [facility]) 10-325 MG tablet Take 1 tablet by mouth every 6 (six) hours as needed for moderate pain. 11/16/16  Yes [provider]  Melatonin 10 MG CAPS Take 30 mg by mouth at bedtime.    Yes [provider]  methocarbamol (ROBAXIN) 750 MG tablet Take 1,500 mg by mouth 3 (three) times daily. 04/10/16  Yes [provider]  morphine (MS CONTIN) 30 MG 12 hr tablet Take 30 mg by mouth 3 (three) times daily.  11/16/16  Yes [provider]  naloxone (NARCAN) nasal spray 4 mg/0.1 mL Place 1 spray into the nose once. 12/28/21  Yes [provider]  ondansetron (ZOFRAN-ODT) 8 MG disintegrating tablet Take 8 mg by mouth every 8 (eight) hours as needed for nausea or vomiting.   Yes [provider]  OVER THE COUNTER MEDICATION Take 1 tablet by mouth daily. ADK  5 immunity + cardiovascular   Yes [provider]  OVER THE COUNTER MEDICATION Take 1 capsule by mouth daily. DIM SGS detox   Yes [provider]  pentosan polysulfate (ELMIRON) 100 MG capsule Take 100 mg by mouth 3 (three) times daily as needed (IC flareup). 06/22/16  Yes [provider]  polyethylene glycol (MIRALAX / GLYCOLAX) packet Take 17 g by mouth as needed for moderate constipation.   Yes [provider]  Prasterone, DHEA, 10 MG CAPS Take 10 mg by mouth daily.   Yes [provider]  progesterone (PROMETRIUM) 200 MG capsule Take 200 mg by mouth at bedtime.   Yes [provider]  rizatriptan (MAXALT) 10 MG tablet Take 10 mg by mouth as needed for migraine. 04/27/19  Yes [provider]  dexlansoprazole (DEXILANT) 60 MG capsule TAKE 1 CAPSULE(60 MG) BY MOUTH DAILY Patient not taking: Reported on 07/23/2022 06/22/21   [name], MD  dexlansoprazole (DEXILANT) 60 MG capsule Take 1 capsule (60 mg total) by mouth 2 (two) times daily. Patient not taking: Reported on 07/23/2022 08/10/21   [name], MD  dextromethorphan-guaiFENesin [facility] DM) 30-600 MG 12hr tablet Take 1 tablet by mouth 2 (two) times daily. Patient taking differently: Take 1 tablet by mouth 2 (two) times daily as needed for cough. 04/21/19   Mercy Riding, MD  diclofenac (VOLTAREN) 75 MG EC tablet TAKE 1 TABLET(75 MG) BY MOUTH TWICE DAILY Patient taking differently: Take 75 mg by mouth 2 (two) times  daily as needed for moderate pain. 10/21/17   Landis Martins, DPM  famotidine (PEPCID) 40 MG tablet Take 1 tablet (40 mg total) by mouth daily. 07/25/22   [name], MD  fexofenadine (ALLEGRA) 180 MG tablet Take 180 mg by mouth daily as needed for allergies or rhinitis.    [provider]  metoprolol succinate (TOPROL-XL) 25 MG 24 hr tablet Take 1 tablet (25 mg total) by mouth daily. Needs to get medication from primary care provider going forward Patient not taking: Reported on 07/23/2022 04/16/22   Richardo Priest, MD  metoprolol tartrate (LOPRESSOR) 25 MG tablet Take 1 tablet (25 mg total) by mouth daily as needed (rapid heart rate). Patient needs an appointment for further refills. 2 nd attempt Patient not taking: Reported on 07/23/2022 01/31/22   Richardo Priest, MD  metroNIDAZOLE (METROGEL) 1 % gel Apply 1 Application topically daily.    [provider]  oxyCODONE-acetaminophen (PERCOCET) 5-325 MG tablet Take 1 tablet by mouth every 6 (six) hours as needed. Patient not taking: Reported on 07/23/2022 03/30/22   Drenda Freeze, MD    ROS: All other systems have been reviewed and were otherwise negative with the exception of those mentioned in the HPI and as above.  Physical Exam: General: Alert, no acute distress Cardiovascular: No pedal edema Respiratory: No cyanosis, no use of accessory musculature GI: No organomegaly, abdomen is soft and non-tender Skin: No lesions in the area of chief complaint Neurologic: Sensation intact distally Psychiatric: Patient is competent for consent with normal mood and affect Lymphatic: No axillary or cervical lymphadenopathy  MUSCULOSKELETAL:  The patient is a well appearing female sitting on the exam table in no acute distress.  Right knee with no obvious effusion.  She is tender to palpation at the medial joint line.  Nontender to palpation at the lateral joint line.  Nontender over the patella.  Negative patellar apprehension.   Range of motion of the knee is full.  She has  5/5 strength throughout.  No ligamentous instability with varus and valgus stressing.  She has firm end point on Lachman.  Positive McMurray's test.      Imaging: MRI of the right knee demonstrates bony edema throughout the medial femoral condyle, medial meniscus tear  Assessment: right knee meniscus tear, distal femoral fracture  Plan: Plan for Procedure(s): KNEE ARTHROSCOPY WITH MEDIAL MENISECTOMY  The risks benefits and alternatives were discussed with the patient including but not limited to the risks of nonoperative treatment, versus surgical intervention including infection, bleeding, nerve injury,  blood clots, cardiopulmonary complications, morbidity, mortality, among others, and they were willing to proceed.   The patient acknowledged the explanation, agreed to proceed with the plan and consent was signed.   Operative Plan: Right knee scope with medial meniscectomy and possible subchondroplasty Discharge Medications: standard (in pain management - no additional narcotics) DVT Prophylaxis: aspirin Physical Therapy: outpatient PT Special Discharge needs: +/-   Ethelda Chick, PA-C  07/26/2022 1:25 PM

## 2022-07-31 DIAGNOSIS — R102 Pelvic and perineal pain: Secondary | ICD-10-CM | POA: Diagnosis not present

## 2022-07-31 DIAGNOSIS — S92001A Unspecified fracture of right calcaneus, initial encounter for closed fracture: Secondary | ICD-10-CM | POA: Diagnosis not present

## 2022-07-31 NOTE — Progress Notes (Signed)
Called earlier and left message. Called again- informed pt. Of change in time for surgery.   Aware to be in preop at 0515.

## 2022-08-01 ENCOUNTER — Inpatient Hospital Stay (HOSPITAL_COMMUNITY): Payer: PPO | Admitting: Physician Assistant

## 2022-08-01 ENCOUNTER — Other Ambulatory Visit: Payer: Self-pay

## 2022-08-01 ENCOUNTER — Inpatient Hospital Stay (HOSPITAL_COMMUNITY)
Admission: RE | Admit: 2022-08-01 | Discharge: 2022-08-01 | DRG: 488 | Disposition: A | Discharge status: Home / Self Care | Payer: PPO | Attending: Orthopaedic Surgery | Admitting: Orthopaedic Surgery

## 2022-08-01 ENCOUNTER — Inpatient Hospital Stay (HOSPITAL_COMMUNITY): Payer: PPO

## 2022-08-01 ENCOUNTER — Encounter (HOSPITAL_COMMUNITY): Payer: Self-pay | Admitting: Orthopaedic Surgery

## 2022-08-01 ENCOUNTER — Encounter (HOSPITAL_COMMUNITY)
Admission: RE | Disposition: A | Discharge status: Home / Self Care | Payer: Self-pay | Source: Home / Self Care | Attending: Orthopaedic Surgery

## 2022-08-01 ENCOUNTER — Inpatient Hospital Stay (HOSPITAL_COMMUNITY): Payer: PPO | Admitting: Anesthesiology

## 2022-08-01 DIAGNOSIS — K219 Gastro-esophageal reflux disease without esophagitis: Secondary | ICD-10-CM | POA: Diagnosis not present

## 2022-08-01 DIAGNOSIS — Z8249 Family history of ischemic heart disease and other diseases of the circulatory system: Secondary | ICD-10-CM | POA: Diagnosis not present

## 2022-08-01 DIAGNOSIS — Z800 Family history of malignant neoplasm of digestive organs: Secondary | ICD-10-CM

## 2022-08-01 DIAGNOSIS — Z811 Family history of alcohol abuse and dependence: Secondary | ICD-10-CM | POA: Diagnosis not present

## 2022-08-01 DIAGNOSIS — Z888 Allergy status to other drugs, medicaments and biological substances status: Secondary | ICD-10-CM | POA: Diagnosis not present

## 2022-08-01 DIAGNOSIS — Z981 Arthrodesis status: Secondary | ICD-10-CM | POA: Diagnosis not present

## 2022-08-01 DIAGNOSIS — Z8051 Family history of malignant neoplasm of kidney: Secondary | ICD-10-CM

## 2022-08-01 DIAGNOSIS — Z85828 Personal history of other malignant neoplasm of skin: Secondary | ICD-10-CM | POA: Diagnosis not present

## 2022-08-01 DIAGNOSIS — S72431A Displaced fracture of medial condyle of right femur, initial encounter for closed fracture: Secondary | ICD-10-CM | POA: Diagnosis not present

## 2022-08-01 DIAGNOSIS — Z806 Family history of leukemia: Secondary | ICD-10-CM

## 2022-08-01 DIAGNOSIS — M84451A Pathological fracture, right femur, initial encounter for fracture: Secondary | ICD-10-CM | POA: Diagnosis not present

## 2022-08-01 DIAGNOSIS — Z91013 Allergy to seafood: Secondary | ICD-10-CM | POA: Diagnosis not present

## 2022-08-01 DIAGNOSIS — S83241A Other tear of medial meniscus, current injury, right knee, initial encounter: Secondary | ICD-10-CM | POA: Diagnosis not present

## 2022-08-01 DIAGNOSIS — Z801 Family history of malignant neoplasm of trachea, bronchus and lung: Secondary | ICD-10-CM

## 2022-08-01 DIAGNOSIS — Z7982 Long term (current) use of aspirin: Secondary | ICD-10-CM

## 2022-08-01 DIAGNOSIS — S72401A Unspecified fracture of lower end of right femur, initial encounter for closed fracture: Secondary | ICD-10-CM

## 2022-08-01 DIAGNOSIS — Z9889 Other specified postprocedural states: Secondary | ICD-10-CM

## 2022-08-01 DIAGNOSIS — F418 Other specified anxiety disorders: Secondary | ICD-10-CM | POA: Diagnosis not present

## 2022-08-01 DIAGNOSIS — I2510 Atherosclerotic heart disease of native coronary artery without angina pectoris: Secondary | ICD-10-CM | POA: Diagnosis not present

## 2022-08-01 DIAGNOSIS — Z881 Allergy status to other antibiotic agents status: Secondary | ICD-10-CM | POA: Diagnosis not present

## 2022-08-01 DIAGNOSIS — Z79899 Other long term (current) drug therapy: Secondary | ICD-10-CM

## 2022-08-01 DIAGNOSIS — Z91048 Other nonmedicinal substance allergy status: Secondary | ICD-10-CM

## 2022-08-01 DIAGNOSIS — W109XXA Fall (on) (from) unspecified stairs and steps, initial encounter: Secondary | ICD-10-CM | POA: Diagnosis present

## 2022-08-01 DIAGNOSIS — Z87891 Personal history of nicotine dependence: Secondary | ICD-10-CM

## 2022-08-01 DIAGNOSIS — Z8616 Personal history of COVID-19: Secondary | ICD-10-CM | POA: Diagnosis not present

## 2022-08-01 DIAGNOSIS — Z4789 Encounter for other orthopedic aftercare: Secondary | ICD-10-CM | POA: Diagnosis not present

## 2022-08-01 DIAGNOSIS — G8918 Other acute postprocedural pain: Secondary | ICD-10-CM | POA: Diagnosis not present

## 2022-08-01 HISTORY — DX: Other specified postprocedural states: Z98.890

## 2022-08-01 HISTORY — PX: KNEE ARTHROSCOPY WITH MEDIAL MENISECTOMY: SHX5651

## 2022-08-01 SURGERY — ARTHROSCOPY, KNEE, WITH MEDIAL MENISCECTOMY
Anesthesia: Regional | Site: Knee | Laterality: Right

## 2022-08-01 MED ORDER — CHLORHEXIDINE GLUCONATE 0.12 % MT SOLN
15.0000 mL | Freq: Once | OROMUCOSAL | Status: AC
  Administered 2022-08-01: 15 mL via OROMUCOSAL

## 2022-08-01 MED ORDER — PHENYLEPHRINE 80 MCG/ML (10ML) SYRINGE FOR IV PUSH (FOR BLOOD PRESSURE SUPPORT)
PREFILLED_SYRINGE | INTRAVENOUS | Status: DC | PRN
  Administered 2022-08-01: 80 ug via INTRAVENOUS

## 2022-08-01 MED ORDER — CEFAZOLIN SODIUM-DEXTROSE 2-4 GM/100ML-% IV SOLN
2.0000 g | INTRAVENOUS | Status: AC
  Administered 2022-08-01: 2 g via INTRAVENOUS
  Filled 2022-08-01: qty 100

## 2022-08-01 MED ORDER — ONDANSETRON HCL 4 MG/2ML IJ SOLN
INTRAMUSCULAR | Status: DC | PRN
  Administered 2022-08-01: 4 mg via INTRAVENOUS

## 2022-08-01 MED ORDER — ACETAMINOPHEN 500 MG PO TABS
1000.0000 mg | ORAL_TABLET | Freq: Once | ORAL | Status: AC
  Administered 2022-08-01: 1000 mg via ORAL
  Filled 2022-08-01: qty 2

## 2022-08-01 MED ORDER — SODIUM CHLORIDE 0.9 % IR SOLN
Status: DC | PRN
  Administered 2022-08-01: 6000 mL

## 2022-08-01 MED ORDER — CELECOXIB 100 MG PO CAPS: 100.0000 mg | ORAL_CAPSULE | Freq: Two times a day (BID) | ORAL | 0 refills | Status: AC

## 2022-08-01 MED ORDER — FENTANYL CITRATE PF 50 MCG/ML IJ SOSY
25.0000 ug | PREFILLED_SYRINGE | INTRAMUSCULAR | Status: DC | PRN
  Administered 2022-08-01 (×2): 50 ug via INTRAVENOUS

## 2022-08-01 MED ORDER — DEXAMETHASONE SODIUM PHOSPHATE 10 MG/ML IJ SOLN
INTRAMUSCULAR | Status: DC | PRN
  Administered 2022-08-01: 10 mg

## 2022-08-01 MED ORDER — FENTANYL CITRATE (PF) 100 MCG/2ML IJ SOLN
INTRAMUSCULAR | Status: DC | PRN
  Administered 2022-08-01 (×2): 50 ug via INTRAVENOUS

## 2022-08-01 MED ORDER — LIDOCAINE 2% (20 MG/ML) 5 ML SYRINGE
INTRAMUSCULAR | Status: DC | PRN
  Administered 2022-08-01: 40 mg via INTRAVENOUS

## 2022-08-01 MED ORDER — TRAMADOL HCL 50 MG PO TABS: 50.0000 mg | ORAL_TABLET | Freq: Four times a day (QID) | ORAL | 0 refills | Status: DC | PRN

## 2022-08-01 MED ORDER — LACTATED RINGERS IV SOLN: INTRAVENOUS | Status: DC

## 2022-08-01 MED ORDER — FENTANYL CITRATE (PF) 100 MCG/2ML IJ SOLN
INTRAMUSCULAR | Status: AC
  Filled 2022-08-01: qty 2

## 2022-08-01 MED ORDER — LACTATED RINGERS IV BOLUS: 250.0000 mL | Freq: Once | INTRAVENOUS | Status: DC

## 2022-08-01 MED ORDER — ORAL CARE MOUTH RINSE: 15.0000 mL | Freq: Once | OROMUCOSAL | Status: AC

## 2022-08-01 MED ORDER — METHOCARBAMOL 750 MG PO TABS: 750.0000 mg | ORAL_TABLET | Freq: Three times a day (TID) | ORAL | 0 refills | Status: DC | PRN

## 2022-08-01 MED ORDER — KETOROLAC TROMETHAMINE 30 MG/ML IJ SOLN
INTRAMUSCULAR | Status: DC | PRN
  Administered 2022-08-01: 30 mg via INTRAVENOUS

## 2022-08-01 MED ORDER — EPHEDRINE SULFATE (PRESSORS) 50 MG/ML IJ SOLN
INTRAMUSCULAR | Status: DC | PRN
  Administered 2022-08-01: 10 mg via INTRAVENOUS

## 2022-08-01 MED ORDER — MIDAZOLAM HCL 5 MG/5ML IJ SOLN
INTRAMUSCULAR | Status: DC | PRN
  Administered 2022-08-01: 2 mg via INTRAVENOUS

## 2022-08-01 MED ORDER — SCOPOLAMINE 1 MG/3DAYS TD PT72
MEDICATED_PATCH | TRANSDERMAL | Status: AC
  Administered 2022-08-01: 1.5 mg via TRANSDERMAL
  Filled 2022-08-01: qty 1

## 2022-08-01 MED ORDER — ROPIVACAINE HCL 5 MG/ML IJ SOLN
INTRAMUSCULAR | Status: DC | PRN
  Administered 2022-08-01: 20 mL via EPIDURAL

## 2022-08-01 MED ORDER — ASPIRIN 81 MG PO CHEW: 81.0000 mg | CHEWABLE_TABLET | Freq: Two times a day (BID) | ORAL | 0 refills | Status: AC

## 2022-08-01 MED ORDER — SCOPOLAMINE 1 MG/3DAYS TD PT72: 1.0000 | MEDICATED_PATCH | TRANSDERMAL | Status: DC

## 2022-08-01 MED ORDER — MIDAZOLAM HCL 2 MG/2ML IJ SOLN
INTRAMUSCULAR | Status: AC
  Filled 2022-08-01: qty 2

## 2022-08-01 MED ORDER — PROMETHAZINE HCL 12.5 MG PO TABS: 12.5000 mg | ORAL_TABLET | Freq: Four times a day (QID) | ORAL | 0 refills | Status: DC | PRN

## 2022-08-01 MED ORDER — ACETAMINOPHEN 500 MG PO TABS: 1000.0000 mg | ORAL_TABLET | Freq: Three times a day (TID) | ORAL | 0 refills | Status: AC

## 2022-08-01 MED ORDER — KETOROLAC TROMETHAMINE 10 MG PO TABS: 10.0000 mg | ORAL_TABLET | Freq: Three times a day (TID) | ORAL | 0 refills | Status: AC

## 2022-08-01 MED ORDER — GABAPENTIN 100 MG PO CAPS: 100.0000 mg | ORAL_CAPSULE | Freq: Three times a day (TID) | ORAL | 0 refills | Status: AC

## 2022-08-01 MED ORDER — PROPOFOL 10 MG/ML IV BOLUS
INTRAVENOUS | Status: AC
  Filled 2022-08-01: qty 20

## 2022-08-01 MED ORDER — FENTANYL CITRATE PF 50 MCG/ML IJ SOSY
PREFILLED_SYRINGE | INTRAMUSCULAR | Status: AC
  Administered 2022-08-01: 50 ug via INTRAVENOUS
  Filled 2022-08-01: qty 3

## 2022-08-01 MED ORDER — BUPIVACAINE HCL 0.25 % IJ SOLN
INTRAMUSCULAR | Status: AC
  Filled 2022-08-01: qty 1

## 2022-08-01 MED ORDER — PROPOFOL 10 MG/ML IV BOLUS
INTRAVENOUS | Status: DC | PRN
  Administered 2022-08-01: 140 mg via INTRAVENOUS

## 2022-08-01 SURGICAL SUPPLY — 40 items
APL PRP STRL LF DISP 70% ISPRP (MISCELLANEOUS) ×1
BANDAGE ESMARK 6X9 LF (GAUZE/BANDAGES/DRESSINGS) IMPLANT
BLADE EXCALIBUR 4.0X13 (MISCELLANEOUS) IMPLANT
BNDG CMPR 5X62 HK CLSR LF (GAUZE/BANDAGES/DRESSINGS) ×1
BNDG CMPR 9X6 STRL LF SNTH (GAUZE/BANDAGES/DRESSINGS)
BNDG ELASTIC 6INX 5YD STR LF (GAUZE/BANDAGES/DRESSINGS) ×1 IMPLANT
BNDG ESMARK 6X9 LF (GAUZE/BANDAGES/DRESSINGS)
CHLORAPREP W/TINT 26 (MISCELLANEOUS) ×1 IMPLANT
CLSR STERI-STRIP ANTIMIC 1/2X4 (GAUZE/BANDAGES/DRESSINGS) ×1 IMPLANT
COOLER ICEMAN CLASSIC (MISCELLANEOUS) IMPLANT
COVER SURGICAL LIGHT HANDLE (MISCELLANEOUS) ×1 IMPLANT
CUFF TOURN SGL QUICK 34 (TOURNIQUET CUFF) ×1
CUFF TRNQT CYL 34X4.125X (TOURNIQUET CUFF) IMPLANT
DISSECTOR 3.5MM X 13CM CVD (MISCELLANEOUS) ×1 IMPLANT
DRAPE ARTHROSCOPY W/POUCH 114 (DRAPES) ×1 IMPLANT
DRAPE U-SHAPE 47X51 STRL (DRAPES) ×1 IMPLANT
GAUZE PAD ABD 8X10 STRL (GAUZE/BANDAGES/DRESSINGS) ×1 IMPLANT
GAUZE SPONGE 4X4 12PLY STRL (GAUZE/BANDAGES/DRESSINGS) ×1 IMPLANT
GLOVE BIO SURGEON STRL SZ 6.5 (GLOVE) ×2 IMPLANT
GLOVE BIOGEL PI IND STRL 6.5 (GLOVE) ×1 IMPLANT
GLOVE BIOGEL PI IND STRL 8 (GLOVE) ×2 IMPLANT
GLOVE ECLIPSE 8.0 STRL XLNG CF (GLOVE) ×1 IMPLANT
GLOVE SURG ORTHO 8.0 STRL STRW (GLOVE) ×1 IMPLANT
GOWN STRL REUS W/ TWL LRG LVL3 (GOWN DISPOSABLE) ×2 IMPLANT
GOWN STRL REUS W/TWL LRG LVL3 (GOWN DISPOSABLE) ×2
GRAFT FILLER BONE 5ML (Knees) IMPLANT
KIT ACCUFILL 5CC (Knees) ×1 IMPLANT
KIT KNEE SCP 414.502 (Knees) ×1 IMPLANT
KIT TURNOVER KIT A (KITS) IMPLANT
MANIFOLD NEPTUNE II (INSTRUMENTS) ×1 IMPLANT
PACK ARTHROSCOPY WL (CUSTOM PROCEDURE TRAY) ×1 IMPLANT
PAD COLD SHLDR UNI WRAP-ON (PAD) ×1
PAD COLD UNI WRAP-ON (PAD) IMPLANT
PADDING CAST COTTON 6X4 STRL (CAST SUPPLIES) ×1 IMPLANT
PORT APPOLLO RF 90DEGREE MULTI (SURGICAL WAND) IMPLANT
SPIKE FLUID TRANSFER (MISCELLANEOUS) ×1 IMPLANT
SPONGE T-LAP 4X18 ~~LOC~~+RFID (SPONGE) ×1 IMPLANT
SUT MNCRL AB 4-0 PS2 18 (SUTURE) ×1 IMPLANT
TOWEL OR 17X26 10 PK STRL BLUE (TOWEL DISPOSABLE) ×1 IMPLANT
TUBING ARTHROSCOPY IRRIG 16FT (MISCELLANEOUS) ×1 IMPLANT

## 2022-08-01 NOTE — Discharge Summary (Signed)
Patient ID: Lisa [name] MRN: [id number] DOB/AGE: 06/26/1970 52 y.o.  Admit date: 08/01/2022 Discharge date: 08/01/2022  Admission Diagnoses:Right medial meniscus tear and distal femoral insufficiency fracture   Discharge Diagnoses:  Principal Problem:   S/P right knee arthroscopy   Past Medical History:  Diagnosis Date   Acute respiratory disease due to COVID-19 virus 04/17/2019   Anemia    Anxiety    Bursitis 07/12/2015   Chronic back pain    Chronic constipation    Chronic interstitial cystitis 11/12/2016   Coronary artery disease    DDD (degenerative disc disease), cervical 01/02/2019   MRI from February 01, 2017 showed mild degenerative changes.   DDD (degenerative disc disease), lumbar 01/02/2019   Status post fusion in 2009 and 2010   Depression    Eosinophilic esophagitis    FH: colon cancer    GERD (gastroesophageal reflux disease)    Headache    Hiatal hernia    History of Clostridioides difficile infection 01/02/2019   History of endometriosis 01/02/2019   History of gastroesophageal reflux (GERD) 01/02/2019   History of kidney stones    History of migraine 01/02/2019   Hx of Clostridium difficile infection    IBS (irritable bowel syndrome)    Interstitial cystitis 01/02/2019   Major depressive disorder, single episode, unspecified 06/09/2013   Myofascial pain 06/04/2018   Pneumonia    PONV (postoperative nausea and vomiting)    Post laminectomy syndrome 10/09/2013   Primary osteoarthritis of both hands 01/02/2019   Clinical and radiographic findings are consistent with osteoarthritis.  All autoimmune work-up was negative.  Use of Voltaren gel and natural anti-inflammatorie   SI (sacroiliac) pain 09/09/2015   Skin cancer      Procedures Performed:  - Right partial medial meniscectomy - Right distal femoral open reduction total fixation with subchondral calcium phosphate  Discharged Condition: stable  Hospital Course: Patient brought in as an  outpatient for surgery.  She tolerated procedure well.  She was kept for monitoring in PACU for pain control and medical monitoring postop. She was found to be stable for DC home the afternoon of surgery.  Patient was instructed on specific activity restrictions and all questions were answered.  Consults: None  Significant Diagnostic Studies: No additional pertinent studies  Treatments: Surgery  Discharge Exam: General: no acute distress RLE: dressing CDI  Disposition: Discharge disposition: 01-Home or Self Care       Discharge Instructions     Call MD for:  redness, tenderness, or signs of infection (pain, swelling, redness, odor or green/yellow discharge around incision site)   Complete by: As directed    Call MD for:  severe uncontrolled pain   Complete by: As directed    Call MD for:  temperature >100.4   Complete by: As directed    Diet - low sodium heart healthy   Complete by: As directed       Allergies as of 08/01/2022       Reactions   Shellfish Allergy Anaphylaxis   Throat closes    Azithromycin Other (See Comments)   C-diff   Metaxalone Itching   Tape    Blisters skin        Medication List     STOP taking these medications    diclofenac 75 MG EC tablet Commonly known as: VOLTAREN   HYDROcodone-acetaminophen 10-325 MG tablet Commonly known as: NORCO   MIDOL PO Replaced by: acetaminophen 500 MG tablet   ondansetron 8 MG disintegrating tablet Commonly  known as: ZOFRAN-ODT   oxyCODONE-acetaminophen 5-325 MG tablet Commonly known as: Percocet       TAKE these medications    acetaminophen 500 MG tablet Commonly known as: TYLENOL Take 2 tablets (1,000 mg total) by mouth every 8 (eight) hours for 14 days. Replaces: MIDOL PO   aspirin 81 MG chewable tablet Commonly known as: Aspirin Childrens Chew 1 tablet (81 mg total) by mouth 2 (two) times daily. For 6 weeks for DVT prophylaxis after surgery What changed: additional instructions    celecoxib 100 MG capsule Commonly known as: CeleBREX Take 1 capsule (100 mg total) by mouth 2 (two) times daily. For 2 weeks. Then take as needed   Combivent Respimat 20-100 MCG/ACT Aers respimat Generic drug: Ipratropium-Albuterol Inhale 1 puff into the lungs every 6 (six) hours as needed for shortness of breath or wheezing.   dexlansoprazole 60 MG capsule Commonly known as: Dexilant TAKE 1 CAPSULE(60 MG) BY MOUTH DAILY What changed: Another medication with the same name was changed. Make sure you understand how and when to take each.   dexlansoprazole 60 MG capsule Commonly known as: DEXILANT Take 1 capsule (60 mg total) by mouth daily. What changed:  when to take this reasons to take this   dexlansoprazole 60 MG capsule Commonly known as: Dexilant Take 1 capsule (60 mg total) by mouth 2 (two) times daily. What changed: Another medication with the same name was changed. Make sure you understand how and when to take each.   dextromethorphan-guaiFENesin 30-600 MG 12hr tablet Commonly known as: MUCINEX DM Take 1 tablet by mouth 2 (two) times daily. What changed:  when to take this reasons to take this   dicyclomine 10 MG capsule Commonly known as: BENTYL TAKE 1 CAPSULE(10 MG) BY MOUTH FOUR TIMES DAILY AS NEEDED FOR SPASMS   famotidine 40 MG tablet Commonly known as: PEPCID Take 1 tablet (40 mg total) by mouth daily.   fexofenadine 180 MG tablet Commonly known as: ALLEGRA Take 180 mg by mouth daily as needed for allergies or rhinitis.   gabapentin 100 MG capsule Commonly known as: Neurontin Take 1 capsule (100 mg total) by mouth 3 (three) times daily for 14 days. For pain.   ketorolac 10 MG tablet Commonly known as: TORADOL Take 1 tablet (10 mg total) by mouth every 8 (eight) hours for 3 days.   Melatonin 10 MG Caps Take 30 mg by mouth at bedtime.   methocarbamol 750 MG tablet Commonly known as: Robaxin-750 Take 1 tablet (750 mg total) by mouth every 8 (eight)  hours as needed for muscle spasms. What changed:  how much to take when to take this reasons to take this   metoprolol succinate 25 MG 24 hr tablet Commonly known as: TOPROL-XL Take 1 tablet (25 mg total) by mouth daily. Needs to get medication from primary care provider going forward   metoprolol tartrate 25 MG tablet Commonly known as: LOPRESSOR Take 1 tablet (25 mg total) by mouth daily as needed (rapid heart rate). Patient needs an appointment for further refills. 2 nd attempt   metroNIDAZOLE 1 % gel Commonly known as: METROGEL Apply 1 Application topically daily.   morphine 30 MG 12 hr tablet Commonly known as: MS CONTIN Take 30 mg by mouth 3 (three) times daily.   naloxone 4 MG/0.1ML Liqd nasal spray kit Commonly known as: NARCAN Place 1 spray into the nose once.   OVER THE COUNTER MEDICATION Take 1 tablet by mouth daily. ADK 5 immunity + cardiovascular  OVER THE COUNTER MEDICATION Take 1 capsule by mouth daily. DIM SGS detox   pentosan polysulfate 100 MG capsule Commonly known as: ELMIRON Take 100 mg by mouth 3 (three) times daily as needed (IC flareup).   polyethylene glycol 17 g packet Commonly known as: MIRALAX / GLYCOLAX Take 17 g by mouth as needed for moderate constipation.   Prasterone (DHEA) 10 MG Caps Take 10 mg by mouth daily.   progesterone 200 MG capsule Commonly known as: PROMETRIUM Take 200 mg by mouth at bedtime.   promethazine 12.5 MG tablet Commonly known as: PHENERGAN Take 1 tablet (12.5 mg total) by mouth every 6 (six) hours as needed for nausea or vomiting.   rizatriptan 10 MG tablet Commonly known as: MAXALT Take 10 mg by mouth as needed for migraine.   traMADol 50 MG tablet Commonly known as: Ultram Take 1 tablet (50 mg total) by mouth every 6 (six) hours as needed for severe pain (breakthrough pain). Not controlled by daily pain prescription   TUMS PO Take 1 tablet by mouth as needed (acid reflux).         Noemi Chapel, PA-C 08/01/22

## 2022-08-01 NOTE — Anesthesia Postprocedure Evaluation (Signed)
Anesthesia Post Note  Patient: Lisa [name]  Procedure(s) Performed: KNEE ARTHROSCOPY WITH MEDIAL MENISECTOMY (Right: Knee)     Patient location during evaluation: PACU Anesthesia Type: Regional and General Level of consciousness: awake and alert Pain management: pain level controlled Vital Signs Assessment: post-procedure vital signs reviewed and stable Respiratory status: spontaneous breathing, nonlabored ventilation, respiratory function stable and patient connected to nasal cannula oxygen Cardiovascular status: blood pressure returned to baseline and stable Postop Assessment: no apparent nausea or vomiting Anesthetic complications: no  No notable events documented.  Last Vitals:  Vitals:   08/01/22 0900 08/01/22 0915  BP: 129/73 (!) 121/54  Pulse: 96 87  Resp: 16 20  Temp:  36.6 C  SpO2: 100% 99%    Last Pain:  Vitals:   08/01/22 0915  TempSrc: Oral  PainSc: 4                  Malvin Morrish L Maysie Parkhill

## 2022-08-01 NOTE — Op Note (Signed)
Orthopaedic Surgery Operative Note (CSN: [id number])  Lisa DILLAHUNT  11-04-1970 Date of Surgery: 08/01/2022   Diagnoses:  Right medial meniscus tear and distal femoral insufficiency fracture  Procedure: Right partial medial meniscectomy Right distal femoral open reduction total fixation with subchondral calcium phosphate   Operative Finding Exam under anesthesia: Full motion in flexion, patient had a 3 to 5 degree flexion contracture at baseline. Suprapatellar pouch: Normal Patellofemoral Compartment: Central area of trochlear grade 4 cartilage loss with well shouldered lesions after debridement. Medial Compartment: Softening of the distal femoral cartilage however there was no obvious cartilage damage, grade 1 changes on the tibia.  Small posterior meniscal tear debrided back, 10% total meniscal volume resected. Lateral Compartment: Normal Intercondylar Notch: Normal  Successful completion of the planned procedure.  Patient's cartilage was intact and we felt that a subchondral insufficiency fracture fixed with calcium phosphate would be appropriate.  Patient will weight-bear immediately.  She does take a high level of baseline narcotics and we described that typically to use tramadol in addition to our nonnarcotic adjuvants for treatment of this patient's pain.  We counseled her at length about her postoperative pain control and advised early ambulation to try and normalize the patient is soon as possible.  Post-operative plan: The patient will be weightbearing to tolerance.  The patient will be admitted per insurance guidelines, reassessed in the recovery area and discharged home if appropriate.  DVT prophylaxis Aspirin 81 mg twice daily for 6 weeks.  Pain control with PRN pain medication preferring oral medicines.  Follow up plan will be scheduled in approximately 7 days for incision check and XR.  Post-Op Diagnosis: Same Surgeons:Primary: Hiram Gash, MD Assistants:Caroline McBane  PA-C Location: [place] [facility] Anesthesia: General with adductor canal Antibiotics: Ancef 2 g Tourniquet time:  Total Tourniquet Time Documented: Thigh (Right) - 28 minutes Total: Thigh (Right) - 28 minutes  Estimated Blood Loss: Minimal Complications: None Specimens: None Implants: Implant Name Type Inv. Item Serial No. Manufacturer Lot No. LRB No. Used Action  KIT KNEE SCP 414.502 - [id number] Knees KIT KNEE SCP 414.[place] RECON(ORTH,TRAU,BIO,SG) [id number] Right 1 Implanted    Indications for Surgery:   Lisa [name] is a 52 y.o. female with medial femoral condyle insufficiency fracture and medial meniscus tear.  Benefits and risks of operative and nonoperative management were discussed prior to surgery with patient/guardian(s) and informed consent form was completed.  Specific risks including infection, need for additional surgery, continued pain, need for arthroplasty, leakage of bone cement and post meniscectomy syndrome amongst others.   Procedure:   The patient was identified properly. Informed consent was obtained and the surgical site was marked. The patient was taken up to suite where general anesthesia was induced. The patient was placed in the supine position with a post against the surgical leg and a nonsterile tourniquet applied. The surgical leg was then prepped and draped usual sterile fashion.  A standard surgical timeout was performed.  2 standard anterior portals were made and diagnostic arthroscopy performed. Please note the findings as noted above.  We used a shaver and a basket debride back the posterior medial meniscus to a stable base.  We removed any loose fragments.  The cartilage was relatively preserved and we felt that fixation of the subchondral insufficiency fracture would be appropriate.  We used fluoroscopic guidance to place a Biomet needle from the subchondroplasty kit.  This was a side injecting needle.  We then were able to place this in the  central  portion of the subchondral insufficiency area noted on MRI.  We then delivered 4-1/2 cc of calcium phosphate and held the needle in place for the total care time.  We confirmed with arthroscope that there is no calcium phosphate in the joint.  Final images demonstrated appropriate reduction of the distal femur in appropriate position of the graft.  Incisions closed with absorbable suture. The patient was awoken from general anesthesia and taken to the PACU in stable condition without complication.   Noemi Chapel, PA-C, present and scrubbed throughout the case, critical for completion in a timely fashion, and for retraction, instrumentation, closure.

## 2022-08-01 NOTE — Anesthesia Preprocedure Evaluation (Addendum)
Anesthesia Evaluation  Patient identified by MRN, date of birth, ID band Patient awake    Reviewed: Allergy & Precautions, NPO status , Patient's Chart, lab work & pertinent test results  History of Anesthesia Complications (+) PONV and history of anesthetic complications  Airway Mallampati: I  TM Distance: >3 FB Neck ROM: Full    Dental no notable dental hx. (+) Teeth Intact, Dental Advisory Given   Pulmonary former smoker   Pulmonary exam normal breath sounds clear to auscultation       Cardiovascular + CAD  Normal cardiovascular exam Rhythm:Regular Rate:Normal     Neuro/Psych  Headaches PSYCHIATRIC DISORDERS Anxiety Depression       GI/Hepatic Neg liver ROS, hiatal hernia,GERD  ,,  Endo/Other  negative endocrine ROS    Renal/GU negative Renal ROS  negative genitourinary   Musculoskeletal  (+) Arthritis ,    Abdominal   Peds  Hematology negative hematology ROS (+)   Anesthesia Other Findings   Reproductive/Obstetrics                             Anesthesia Physical Anesthesia Plan  ASA: 3  Anesthesia Plan: General and Regional   Post-op Pain Management: Regional block* and Tylenol PO (pre-op)*   Induction: Intravenous  PONV Risk Score and Plan: 4 or greater and Ondansetron, Dexamethasone and Midazolam  Airway Management Planned: LMA  Additional Equipment:   Intra-op Plan:   Post-operative Plan: Extubation in OR  Informed Consent: I have reviewed the patients History and Physical, chart, labs and discussed the procedure including the risks, benefits and alternatives for the proposed anesthesia with the patient or authorized representative who has indicated his/her understanding and acceptance.     Dental advisory given  Plan Discussed with: CRNA  Anesthesia Plan Comments:        Anesthesia Quick Evaluation

## 2022-08-01 NOTE — Anesthesia Procedure Notes (Signed)
Procedure Name: LMA Insertion Date/Time: 08/01/2022 7:32 AM  Performed by: Gean Maidens, CRNAPre-anesthesia Checklist: Patient identified, Emergency Drugs available, Suction available, Patient being monitored and Timeout performed Patient Re-evaluated:Patient Re-evaluated prior to induction Oxygen Delivery Method: Circle system utilized Preoxygenation: Pre-oxygenation with 100% oxygen Induction Type: IV induction Ventilation: Mask ventilation without difficulty LMA: LMA inserted LMA Size: 4.0 Number of attempts: 1 Placement Confirmation: positive ETCO2 and breath sounds checked- equal and bilateral Tube secured with: Tape Dental Injury: Teeth and Oropharynx as per pre-operative assessment

## 2022-08-01 NOTE — Discharge Instructions (Addendum)
Ophelia Charter MD, MPH Noemi Chapel, PA-C [facility] [address] [phone] (tel)   [phone] (fax)   POST-OPERATIVE INSTRUCTIONS - Knee Arthroscopy  WOUND CARE - You may remove the Operative Dressing on Post-Op Day #3 (72hrs after surgery).   -  Alternatively if you would like you can leave dressing on until follow-up if within 7-8 days but keep it dry. - Leave steri-strips in place until they fall off on their own, usually 2 weeks postop. - An ACE wrap may be used to control swelling, do not wrap this too tight.  If the initial ACE wrap feels too tight you may loosen it. - There may be a small amount of fluid/bleeding leaking at the surgical site.  - This is normal; the knee is filled with fluid during the procedure and can leak for 24-48hrs after surgery. You may change/reinforce the bandage as needed.  - Use the Cryocuff or Ice as often as possible for the first 7 days, then as needed for pain relief. Always keep a towel, ACE wrap or other barrier between the cooling unit and your skin.  - You may shower on Post-Op Day #3. Gently pat the area dry.  - Do not soak the knee in water or submerge it.  - Do not go swimming in the pool or ocean until 4 weeks after surgery or when otherwise instructed.  Keep dry incisions as dry as possible.   BRACE/AMBULATION  -            You will not need a brace after this procedure.    -           Use crutches to help you ambulate -           Touch-down weight bearing: when you stand or walk, you may only touch your toes to the floor for balance -           Do NOT put any body weight on your leg  PHYSICAL THERAPY - You will begin physical therapy soon after surgery (unless otherwise specified)  - A PT referral was sent to [facility] in [place] - Please call their office to schedule an appointment if you have not already done so  REGIONAL ANESTHESIA (NERVE BLOCKS) The anesthesia team may have performed  a nerve block for you this is a great tool used to minimize pain.   The block may start wearing off overnight (between 8-24 hours postop) When the block wears off, your pain may go from nearly zero to the pain you would have had postop without the block. This is an abrupt transition but nothing dangerous is happening.   This can be a challenging period but utilize your as needed pain medications to try and manage this period. We suggest you use the pain medication the first night prior to going to bed, to ease this transition.  You may take an extra dose of narcotic when this happens if needed   POST-OP MEDICATIONS- Multimodal approach to pain control In general your pain will be controlled with a combination of substances.  Prescriptions unless otherwise discussed are electronically sent to your pharmacy.  This is a carefully made plan we use to minimize narcotic use.     Toradol - Anti-inflammatory medication taken on a scheduled basis Take one tablet three times a day for 3 days Then you will start Celebrex Do not take Celebrex and Toradol together Celebrex - Anti-inflammatory medication taken on  a scheduled basis Acetaminophen - Non-narcotic pain medicine taken on a scheduled basis Gabapentin - this is to help with nerve based pain, take on a scheduled basis Tramadol  - This is a strong narcotic, to be used only on an "as needed" basis for SEVERE pain. You may take this for breakthrough pain ONLY, not controlled by your daily pain prescription Aspirin 81mg  - This medicine is used to minimize the risk of blood clots after surgery. Robaxin - this is is a muscle relaxer, take as needed for muscle spasms Phenergan - take as needed for nausea    FOLLOW-UP   Please call the office to schedule a follow-up appointment for your incision check, 7-10 days post-operatively.   IF YOU HAVE ANY QUESTIONS, PLEASE FEEL FREE TO CALL OUR OFFICE.   HELPFUL INFORMATION   Keep your leg elevated  to decrease swelling, which will then in turn decrease your pain. I would elevate the foot of your bed by putting a couple of couch pillows between your mattress and box spring. I would not keep pillow directly under your ankle.  - Do not sleep with a pillow behind your knee even if it is more comfortable as this may make it harder to get your knee fully straight long term.   There will be MORE swelling on days 1-3 than there is on the day of surgery.  This also is normal. The swelling will decrease with the anti-inflammatory medication, ice and keeping it elevated. The swelling will make it more difficult to bend your knee. As the swelling goes down your motion will become easier   You may develop swelling and bruising that extends from your knee down to your calf and perhaps even to your foot over the next week. Do not be alarmed. This too is normal, and it is due to gravity   There may be some numbness adjacent to the incision site. This may last for 6-12 months or longer in some patients and is expected.   You may return to sedentary work/school in the next couple of days when you feel up to it. You will need to keep your leg elevated as much as possible    You should wean off your narcotic medicines as soon as you are able.  Most patients will be off or using minimal narcotics before their first postop appointment.    We suggest you use the pain medication the first night prior to going to bed, in order to ease any pain when the anesthesia wears off. You should avoid taking pain medications on an empty stomach as it will make you nauseous.   Do not drink alcoholic beverages or take illicit drugs when taking pain medications.   It is against the law to drive while taking narcotics. You cannot drive if your Right leg is in brace locked in extension.   Pain medication may make you constipated.  Below are a few solutions to try in this order:  o Decrease the amount of pain medication  if you aren't having pain.  o Drink lots of decaffeinated fluids.  o Drink prune juice and/or eat dried prunes   o If the first 3 don't work start with additional solutions  o Take Colace - an over-the-counter stool softener  o Take Senokot - an over-the-counter laxative  o Take Miralax - a stronger over-the-counter laxative    For more information including helpful videos and documents visit our website:   [email or web address]

## 2022-08-01 NOTE — Transfer of Care (Signed)
Immediate Anesthesia Transfer of Care Note  Patient: Lisa [name]  Procedure(s) Performed: KNEE ARTHROSCOPY WITH MEDIAL MENISECTOMY (Right: Knee)  Patient Location: PACU  Anesthesia Type:General  Level of Consciousness: awake, alert , and oriented  Airway & Oxygen Therapy: Patient Spontanous Breathing and Patient connected to face mask oxygen  Post-op Assessment: Report given to RN and Post -op Vital signs reviewed and stable  Post vital signs: Reviewed and stable  Last Vitals:  Vitals Value Taken Time  BP    Temp    Pulse 106 08/01/22 0833  Resp 19 08/01/22 0833  SpO2 100 % 08/01/22 0833  Vitals shown include unvalidated device data.  Last Pain:  Vitals:   08/01/22 0613  TempSrc: Oral         Complications: No notable events documented.

## 2022-08-01 NOTE — Anesthesia Procedure Notes (Signed)
Anesthesia Regional Block: Adductor canal block   Pre-Anesthetic Checklist: , timeout performed,  Correct Patient, Correct Site, Correct Laterality,  Correct Procedure, Correct Position, site marked,  Risks and benefits discussed,  Pre-op evaluation,  At surgeon's request and post-op pain management  Laterality: Right  Prep: Maximum Sterile Barrier Precautions used, chloraprep       Needles:  Injection technique: Single-shot  Needle Type: Echogenic Stimulator Needle     Needle Length: 9cm  Needle Gauge: 21     Additional Needles:   Procedures:,,,, ultrasound used (permanent image in chart),,    Narrative:  Start time: 08/01/2022 7:00 AM End time: 08/01/2022 7:04 AM Injection made incrementally with aspirations every 5 mL. Anesthesiologist: Freddrick March, MD

## 2022-08-02 ENCOUNTER — Encounter (HOSPITAL_COMMUNITY): Payer: Self-pay | Admitting: Orthopaedic Surgery

## 2022-08-02 DIAGNOSIS — H9201 Otalgia, right ear: Secondary | ICD-10-CM | POA: Diagnosis not present

## 2022-08-02 DIAGNOSIS — J0100 Acute maxillary sinusitis, unspecified: Secondary | ICD-10-CM | POA: Diagnosis not present

## 2022-08-02 DIAGNOSIS — R0981 Nasal congestion: Secondary | ICD-10-CM | POA: Diagnosis not present

## 2022-08-06 DIAGNOSIS — M25551 Pain in right hip: Secondary | ICD-10-CM | POA: Diagnosis not present

## 2022-08-06 DIAGNOSIS — M25661 Stiffness of right knee, not elsewhere classified: Secondary | ICD-10-CM | POA: Diagnosis not present

## 2022-08-06 DIAGNOSIS — M25561 Pain in right knee: Secondary | ICD-10-CM | POA: Diagnosis not present

## 2022-08-06 DIAGNOSIS — M25571 Pain in right ankle and joints of right foot: Secondary | ICD-10-CM | POA: Diagnosis not present

## 2022-08-06 DIAGNOSIS — R2689 Other abnormalities of gait and mobility: Secondary | ICD-10-CM | POA: Diagnosis not present

## 2022-08-07 DIAGNOSIS — M25551 Pain in right hip: Secondary | ICD-10-CM | POA: Diagnosis not present

## 2022-08-07 DIAGNOSIS — T753XXA Motion sickness, initial encounter: Secondary | ICD-10-CM | POA: Diagnosis not present

## 2022-08-09 DIAGNOSIS — S72431A Displaced fracture of medial condyle of right femur, initial encounter for closed fracture: Secondary | ICD-10-CM | POA: Diagnosis not present

## 2022-08-09 DIAGNOSIS — S83241A Other tear of medial meniscus, current injury, right knee, initial encounter: Secondary | ICD-10-CM | POA: Diagnosis not present

## 2022-08-14 DIAGNOSIS — M25661 Stiffness of right knee, not elsewhere classified: Secondary | ICD-10-CM | POA: Diagnosis not present

## 2022-08-14 DIAGNOSIS — M25551 Pain in right hip: Secondary | ICD-10-CM | POA: Diagnosis not present

## 2022-08-14 DIAGNOSIS — M25561 Pain in right knee: Secondary | ICD-10-CM | POA: Diagnosis not present

## 2022-08-14 DIAGNOSIS — R2689 Other abnormalities of gait and mobility: Secondary | ICD-10-CM | POA: Diagnosis not present

## 2022-08-14 DIAGNOSIS — M25571 Pain in right ankle and joints of right foot: Secondary | ICD-10-CM | POA: Diagnosis not present

## 2022-08-15 ENCOUNTER — Other Ambulatory Visit: Payer: Self-pay | Admitting: Gastroenterology

## 2022-08-20 DIAGNOSIS — M25551 Pain in right hip: Secondary | ICD-10-CM | POA: Diagnosis not present

## 2022-08-20 DIAGNOSIS — M25571 Pain in right ankle and joints of right foot: Secondary | ICD-10-CM | POA: Diagnosis not present

## 2022-08-20 DIAGNOSIS — M25561 Pain in right knee: Secondary | ICD-10-CM | POA: Diagnosis not present

## 2022-08-20 DIAGNOSIS — R2689 Other abnormalities of gait and mobility: Secondary | ICD-10-CM | POA: Diagnosis not present

## 2022-08-20 DIAGNOSIS — M25661 Stiffness of right knee, not elsewhere classified: Secondary | ICD-10-CM | POA: Diagnosis not present

## 2022-08-22 DIAGNOSIS — M79671 Pain in right foot: Secondary | ICD-10-CM | POA: Diagnosis not present

## 2022-08-22 DIAGNOSIS — M25571 Pain in right ankle and joints of right foot: Secondary | ICD-10-CM | POA: Diagnosis not present

## 2022-08-28 DIAGNOSIS — M25561 Pain in right knee: Secondary | ICD-10-CM | POA: Diagnosis not present

## 2022-09-03 DIAGNOSIS — M79671 Pain in right foot: Secondary | ICD-10-CM | POA: Diagnosis not present

## 2022-09-03 DIAGNOSIS — S93601A Unspecified sprain of right foot, initial encounter: Secondary | ICD-10-CM | POA: Diagnosis not present

## 2022-09-03 DIAGNOSIS — R0981 Nasal congestion: Secondary | ICD-10-CM | POA: Diagnosis not present

## 2022-09-03 DIAGNOSIS — J0100 Acute maxillary sinusitis, unspecified: Secondary | ICD-10-CM | POA: Diagnosis not present

## 2022-09-11 DIAGNOSIS — R300 Dysuria: Secondary | ICD-10-CM | POA: Diagnosis not present

## 2022-09-17 ENCOUNTER — Other Ambulatory Visit: Payer: Self-pay

## 2022-09-17 DIAGNOSIS — N950 Postmenopausal bleeding: Secondary | ICD-10-CM

## 2022-09-17 DIAGNOSIS — Z87442 Personal history of urinary calculi: Secondary | ICD-10-CM | POA: Insufficient documentation

## 2022-09-17 DIAGNOSIS — C4490 Unspecified malignant neoplasm of skin, unspecified: Secondary | ICD-10-CM | POA: Insufficient documentation

## 2022-09-17 DIAGNOSIS — I2510 Atherosclerotic heart disease of native coronary artery without angina pectoris: Secondary | ICD-10-CM | POA: Insufficient documentation

## 2022-09-17 DIAGNOSIS — D649 Anemia, unspecified: Secondary | ICD-10-CM | POA: Insufficient documentation

## 2022-09-17 DIAGNOSIS — R519 Headache, unspecified: Secondary | ICD-10-CM | POA: Insufficient documentation

## 2022-09-17 DIAGNOSIS — J189 Pneumonia, unspecified organism: Secondary | ICD-10-CM | POA: Insufficient documentation

## 2022-09-17 DIAGNOSIS — Z9889 Other specified postprocedural states: Secondary | ICD-10-CM | POA: Insufficient documentation

## 2022-09-17 HISTORY — DX: Postmenopausal bleeding: N95.0

## 2022-09-17 NOTE — Progress Notes (Unsigned)
Cardiology Office Note:    Date:  09/18/2022   ID:  Lisa [name], DOB [birth year], MRN [id number]  PCP:  Lise Auer, MD   [facility] Providers Cardiologist:  None     Referring MD: Lise Auer, MD   CC: chest tightness  History of Present Illness:    Lisa [name] is a 52 y.o. female with a hx of CAD, GERD, IBS, chronic interstitial cystitis, depression, anxiety, former tobacco use.   Echocardiogram on 09/17/2019 revealed an EF of 60 to 65%, trivial MR.  She was most recently evaluated by Dr. Dulce Sellar on 09/25/2019 for shortness of breath along severe COVID-19 pneumonia the previous year.  Lisa [name] presents today with new complaints of chest tightness that has been occurring for the last several weeks.  Since she was last evaluated by [place] she has suffered a heel fracture, patella fracture, underwent partial knee replacement and has been recovering from that.  This is all been relatively stressful for her, and she just learned she may have to undergo another surgery.  She states her chest tightness has been occurring intermittently throughout the day, can occur with exertion or at rest, a few times it has woken her up from sleep.  She describes the pain as tightness across her chest.  This is also accompanied by palpitations.  Occasionally she also has shortness of breath when the tightness occurs.  Today, she denies dyspnea, pnd, orthopnea, n, v, dizziness, syncope, edema, weight gain, or early satiety.    Past Medical History:  Diagnosis Date   Actinic keratosis of left cheek 01/17/2022   Acute respiratory disease due to COVID-19 virus 04/17/2019   Anemia    Anxiety    Arrhythmia 08/04/2013   Arthropathy of lumbar facet joint 12/28/2021   Bursitis 07/12/2015   Cancer of the skin, basal cell 06/28/2021   Cervical radiculopathy 06/04/2018   Chest discomfort 03/04/2017   Chronic back pain    Chronic constipation    Chronic interstitial cystitis 11/12/2016    Constipation due to opioid therapy 03/21/2015   Coronary artery disease    DDD (degenerative disc disease), cervical 01/02/2019   MRI from February 01, 2017 showed mild degenerative changes.   DDD (degenerative disc disease), lumbar 01/02/2019   Status post fusion in 2009 and 2010   Depression    Disc disorder 06/09/2013   Disorder of forearm joint 06/09/2013   Disorder of sacrum 08/04/2013   Eosinophilic esophagitis    Episodic mood disorder (HCC) 10/11/2012   Failed back syndrome 02/03/2019   Formatting of this note might be different from the original.  Added automatically from request for surgery [id number]   FH: colon cancer    Former smoker 03/04/2017   GERD (gastroesophageal reflux disease)    Headache    Hiatal hernia    History of Clostridioides difficile infection 01/02/2019   History of endometriosis 01/02/2019   History of kidney stones    History of migraine 01/02/2019   IBS (irritable bowel syndrome)    Lumbar radiculopathy 08/04/2013   Major depressive disorder, single episode, unspecified 06/09/2013   Medication management 09/19/2021   Migraine 11/01/2020   Mohs defect of cheek 09/13/2021   Myofascial pain 06/04/2018   Osteoporosis 04/14/2022   Pneumonia    PONV (postoperative nausea and vomiting)    Post laminectomy syndrome 10/09/2013   Postmenopausal bleeding 09/17/2022   Primary osteoarthritis of both hands 01/02/2019   Clinical and radiographic findings are consistent with osteoarthritis.  All autoimmune work-up was negative.  Use of Voltaren gel and natural anti-inflammatorie   S/P right knee arthroscopy 08/01/2022   SI (sacroiliac) pain 09/09/2015   Skin cancer    Squamous cell carcinoma in situ of skin of left cheek 06/28/2021    Past Surgical History:  Procedure Laterality Date   CHOLECYSTECTOMY  2002   COLONOSCOPY  08/17/2016   Internal hemorrhoids.    ESOPHAGOGASTRODUODENOSCOPY  03/01/2017   Gastritis.    KNEE ARTHROSCOPY WITH MEDIAL  MENISECTOMY Right 08/01/2022   Procedure: KNEE ARTHROSCOPY WITH MEDIAL MENISECTOMY;  Surgeon: Bjorn Pippin, MD;  Location: [facility];  Service: Orthopedics;  Laterality: Right;   LAPAROSCOPY     x8   LUMBAR FUSION     2009, 2010, 2011   PLACEMENT OF BREAST IMPLANTS      Current Medications: Current Meds  Medication Sig   Calcium Carbonate Antacid (TUMS PO) Take 1 tablet by mouth as needed (acid reflux).   COMBIVENT RESPIMAT 20-100 MCG/ACT AERS respimat Inhale 1 puff into the lungs every 6 (six) hours as needed for shortness of breath or wheezing.   dexlansoprazole (DEXILANT) 60 MG capsule Take 1 capsule (60 mg total) by mouth daily. Please call [phone] to schedule an office visit for more refills   dextromethorphan-guaiFENesin (MUCINEX DM) 30-600 MG 12hr tablet Take 1 tablet by mouth 2 (two) times daily. (Patient taking differently: Take 1 tablet by mouth 2 (two) times daily as needed for cough.)   diclofenac (VOLTAREN) 75 MG EC tablet Take 75 mg by mouth daily.   dicyclomine (BENTYL) 10 MG capsule TAKE 1 CAPSULE(10 MG) BY MOUTH FOUR TIMES DAILY AS NEEDED FOR SPASMS   famotidine (PEPCID) 40 MG tablet Take 1 tablet (40 mg total) by mouth daily.   fexofenadine (ALLEGRA) 180 MG tablet Take 180 mg by mouth daily as needed for allergies or rhinitis.   finasteride (PROSCAR) 5 MG tablet Take 2.5 mg by mouth daily.   gabapentin (NEURONTIN) 100 MG capsule Take 100 mg by mouth daily.   Melatonin 10 MG CAPS Take 30 mg by mouth at bedtime.    methocarbamol (ROBAXIN-750) 750 MG tablet Take 1 tablet (750 mg total) by mouth every 8 (eight) hours as needed for muscle spasms.   metoprolol succinate (TOPROL-XL) 25 MG 24 hr tablet Take 1 tablet (25 mg total) by mouth daily. Needs to get medication from primary care provider going forward   morphine (MS CONTIN) 30 MG 12 hr tablet Take 30 mg by mouth 3 (three) times daily.    naloxone (NARCAN) nasal spray 4 mg/0.1 mL Place 1 spray into the nose once.    nitrofurantoin, macrocrystal-monohydrate, (MACROBID) 100 MG capsule Take 100 mg by mouth 2 (two) times daily.   OVER THE COUNTER MEDICATION Take 1 tablet by mouth daily. ADK 5 immunity + cardiovascular   OVER THE COUNTER MEDICATION Take 1 capsule by mouth daily. DIM SGS detox   pentosan polysulfate (ELMIRON) 100 MG capsule Take 100 mg by mouth 3 (three) times daily as needed (IC flareup).   polyethylene glycol (MIRALAX / GLYCOLAX) packet Take 17 g by mouth as needed for moderate constipation.   predniSONE (DELTASONE) 50 MG tablet Take 1 tablet 13 hours prior to CT, take 1 7 hours prior and then 1 hour prior   promethazine (PHENERGAN) 12.5 MG tablet Take 1 tablet (12.5 mg total) by mouth every 6 (six) hours as needed for nausea or vomiting.   rizatriptan (MAXALT) 10 MG tablet Take 10 mg by  mouth as needed for migraine.   solifenacin (VESICARE) 10 MG tablet Take 10 mg by mouth daily.   Current Facility-Administered Medications for the 09/18/22 encounter (Office Visit) with Flossie Dibble, NP  Medication   triamcinolone acetonide (KENALOG) 10 MG/ML injection 10 mg     Allergies:   Shellfish allergy, Azithromycin, Metaxalone, and Tape   Social History   Socioeconomic History   Marital status: Married    Spouse name: Not on file   Number of children: 0   Years of education: Not on file   Highest education level: Not on file  Occupational History   Occupation: disable  Tobacco Use   Smoking status: Former    Packs/day: 1.00    Years: 15.00    Additional pack years: 0.00    Total pack years: 15.00    Types: Cigarettes   Smokeless tobacco: Never   Tobacco comments:    quit 15 years ago like 2004  Vaping Use   Vaping Use: Never used  Substance and Sexual Activity   Alcohol use: Never   Drug use: Never   Sexual activity: Not on file  Other Topics Concern   Not on file  Social History Narrative   Not on file   Social Determinants of Health   Financial Resource Strain: Not on  file  Food Insecurity: Not on file  Transportation Needs: Not on file  Physical Activity: Not on file  Stress: Not on file  Social Connections: Not on file     Family History: The patient's family history includes Alcoholism in her brother and father; Colon cancer in her mother; Heart attack in her maternal aunt and maternal uncle; Hypertension in her brother; Kidney cancer in her maternal uncle; Leukemia in her paternal aunt; Liver cancer in her maternal aunt; Lung cancer in her maternal uncle; Pancreatic cancer in her maternal aunt; Rheumatic fever in her mother; Stomach cancer in her maternal aunt. There is no history of Esophageal cancer or Rectal cancer.  ROS:   Please see the history of present illness.     All other systems reviewed and are negative.  EKGs/Labs/Other Studies Reviewed:    The following studies were reviewed today: Cardiac Studies & Procedures       ECHOCARDIOGRAM  ECHOCARDIOGRAM COMPLETE 09/17/2019  Narrative ECHOCARDIOGRAM REPORT    Patient Name:   TEMPA WITTKOP Date of Exam: 09/17/2019 Medical Rec #:  [id number]       Height:       67.0 in Accession #:    [id number]      Weight:       138.0 lb Date of Birth:  1970-08-02       BSA:          1.727 m Patient Age:    48 years        BP:           112/68 mmHg Patient Gender: F               HR:           82 bpm. Exam Location:  [place]  Procedure: 2D Echo  Indications:    Abnormal ECG 794.31 / R94.31  History:        Patient has no prior history of Echocardiogram examinations. Risk Factors:Former Smoker.  Sonographer:    Louie Boston Referring Phys: [phone] BRIAN J MUNLEY  IMPRESSIONS   1. Left ventricular ejection fraction, by estimation, is 60 to 65%. The left ventricle has  normal function. The left ventricle has no regional wall motion abnormalities. Left ventricular diastolic parameters were normal. 2. Right ventricular systolic function is normal. The right ventricular size is normal. There is  normal pulmonary artery systolic pressure. 3. The mitral valve is normal in structure. Trivial mitral valve regurgitation. No evidence of mitral stenosis. 4. The aortic valve is normal in structure. Aortic valve regurgitation is not visualized. No aortic stenosis is present. 5. The inferior vena cava is normal in size with greater than 50% respiratory variability, suggesting right atrial pressure of 3 mmHg.  Comparison(s): No prior Echocardiogram.  FINDINGS Left Ventricle: Left ventricular ejection fraction, by estimation, is 60 to 65%. The left ventricle has normal function. The left ventricle has no regional wall motion abnormalities. The left ventricular internal cavity size was normal in size. There is no left ventricular hypertrophy. Left ventricular diastolic parameters were normal.  Right Ventricle: The right ventricular size is normal. No increase in right ventricular wall thickness. Right ventricular systolic function is normal. There is normal pulmonary artery systolic pressure. The tricuspid regurgitant velocity is 2.36 m/s, and with an assumed right atrial pressure of 3 mmHg, the estimated right ventricular systolic pressure is 25.3 mmHg.  Left Atrium: Left atrial size was normal in size.  Right Atrium: Right atrial size was normal in size.  Pericardium: There is no evidence of pericardial effusion.  Mitral Valve: The mitral valve is normal in structure. Normal mobility of the mitral valve leaflets. Trivial mitral valve regurgitation. No evidence of mitral valve stenosis.  Tricuspid Valve: The tricuspid valve is normal in structure. Tricuspid valve regurgitation is trivial. No evidence of tricuspid stenosis.  Aortic Valve: The aortic valve is normal in structure. Aortic valve regurgitation is not visualized. No aortic stenosis is present.  Pulmonic Valve: The pulmonic valve was normal in structure. Pulmonic valve regurgitation is not visualized. No evidence of pulmonic  stenosis.  Aorta: The aortic root is normal in size and structure.  Venous: The inferior vena cava is normal in size with greater than 50% respiratory variability, suggesting right atrial pressure of 3 mmHg.  IAS/Shunts: No atrial level shunt detected by color flow Doppler.   LEFT VENTRICLE PLAX 2D LVIDd:         4.50 cm  Diastology LVIDs:         2.70 cm  LV e' lateral:   13.90 cm/s LV PW:         0.80 cm  LV E/e' lateral: 5.3 LV IVS:        0.80 cm  LV e' medial:    6.20 cm/s LVOT diam:     2.00 cm  LV E/e' medial:  12.0 LV SV:         64 LV SV Index:   37 LVOT Area:     3.14 cm   RIGHT VENTRICLE             IVC RV S prime:     10.90 cm/s  IVC diam: 1.20 cm TAPSE (M-mode): 2.2 cm  LEFT ATRIUM             Index       RIGHT ATRIUM           Index LA diam:        3.20 cm 1.85 cm/m  RA Area:     13.50 cm LA Vol (A2C):   38.5 ml 22.29 ml/m RA Volume:   30.60 ml  17.72 ml/m LA Vol (A4C):  39.3 ml 22.75 ml/m LA Biplane Vol: 39.5 ml 22.87 ml/m AORTIC VALVE LVOT Vmax:   97.00 cm/s LVOT Vmean:  65.200 cm/s LVOT VTI:    0.204 m  AORTA Ao Root diam: 2.60 cm Ao Asc diam:  2.90 cm  MITRAL VALVE               TRICUSPID VALVE MV Area (PHT): 3.85 cm    TR Peak grad:   22.3 mmHg MV Decel Time: 197 msec    TR Vmax:        236.00 cm/s MV E velocity: 74.10 cm/s MV A velocity: 84.50 cm/s  SHUNTS MV E/A ratio:  0.88        Systemic VTI:  0.20 m Systemic Diam: 2.00 cm  Kardie Tobb DO Electronically signed by Thomasene Ripple DO Signature Date/Time: 09/17/2019/5:40:40 PM    Final              EKG:  EKG is not ordered today.  Reviewed EKG from 07/29/2022, NSR, heart rate 74 bpm.  Recent Labs: 07/26/2022: BUN 16; Creatinine, Ser 0.61; Hemoglobin 12.9; Platelets 325; Potassium 4.1; Sodium 138  Recent Lipid Panel    Component Value Date/Time   TRIG 69 04/17/2019 2057     Risk Assessment/Calculations:                Physical Exam:    VS:  BP 118/76   Pulse 60   Ht  5\' 7"  (1.702 m)   Wt 144 lb (65.3 kg)   SpO2 96%   BMI 22.55 kg/m     Wt Readings from Last 3 Encounters:  09/18/22 144 lb (65.3 kg)  08/01/22 141 lb 3.2 oz (64 kg)  07/26/22 141 lb 3.2 oz (64 kg)     GEN:  Well nourished, well developed in no acute distress HEENT: Normal NECK: No JVD; No carotid bruits LYMPHATICS: No lymphadenopathy CARDIAC: RRR, no murmurs, rubs, gallops RESPIRATORY:  Clear to auscultation without rales, wheezing or rhonchi  ABDOMEN: Soft, non-tender, non-distended MUSCULOSKELETAL:  No edema; No deformity  SKIN: Warm and dry NEUROLOGIC:  Alert and oriented x 3 PSYCHIATRIC:  Normal affect   ASSESSMENT:    1. Precordial chest pain   2. Palpitations   3. SOB (shortness of breath)   4. Precordial pain    PLAN:    In order of problems listed above:  Precordial chest pain-has been occurring intermittently over the last several weeks, chest pain has mixed features, can occur with exertion and at rest, accompanied by palpitations.  There is a history of CAD noted in her chart however I do not see any testing that would indicate this--there is notation of a family history of CAD.  Will arrange coronary CTA.  Will check BMET, fasting lipid panel. Palpitations-these have been occurring with chest pain, as well has without chest pain throughout the day, typically occurring most days of the week.  Will arrange for 3-day ZIO monitor.  Will check BMET, CBC, TSH, magnesium. Shortness of breath-could be an anginal equivalent, although atypical sounding.  No current shortness of breath, no tachycardia, SpO2 96% on room air, no indication for VTE workup.  Disposition-Labs per above, coronary CTA, 3 days ago.  Return in 2 months.           Medication Adjustments/Labs and Tests Ordered: Current medicines are reviewed at length with the patient today.  Concerns regarding medicines are outlined above.  Orders Placed This Encounter  Procedures   CT CORONARY MORPH  W/CTA  COR W/SCORE W/CA W/CM &/OR WO/CM   Basic metabolic panel   Magnesium   CBC with Differential/Platelet   TSH   Hepatic function panel   Lipid panel   LONG TERM MONITOR (3-14 DAYS)   Meds ordered this encounter  Medications   predniSONE (DELTASONE) 50 MG tablet    Sig: Take 1 tablet 13 hours prior to CT, take 1 7 hours prior and then 1 hour prior    Dispense:  3 tablet    Refill:  0    Patient Instructions  Medication Instructions:  Your physician recommends that you continue on your current medications as directed. Please refer to the Current Medication list given to you today.  *If you need a refill on your cardiac medications before your next appointment, please call your pharmacy*   Lab Work: Your physician recommends that you return for lab work in: Today for a BMP, Magnesium, CBC, TSH, Liver Function, and Lipid Panel  If you have labs (blood work) drawn today and your tests are completely normal, you will receive your results only by: MyChart Message (if you have MyChart) OR A paper copy in the mail If you have any lab test that is abnormal or we need to change your treatment, we will call you to review the results.   Testing/Procedures: Please arrive at the [facility] main entrance of [facility] at xx:xx AM (30-45 minutes prior to test start time)  [facility] [address] [place], [place] [address] [phone]  Proceed to the [facility] Radiology Department (First Floor).  Please follow these instructions carefully (unless otherwise directed):    On the Night Before the Test: Drink plenty of water. Do not consume any caffeinated/decaffeinated beverages or chocolate 12 hours prior to your test. Do not take any antihistamines 12 hours prior to your test. If you take Metformin do not take 24 hours prior to test. If the patient has contrast allergy: Patient will need a prescription for Prednisone and very clear instructions (as  follows): Prednisone 50 mg - take 13 hours prior to test Take another Prednisone 50 mg 7 hours prior to test Take another Prednisone 50 mg 1 hour prior to test Take Benadryl 50 mg 1 hour prior to test Patient must complete all four doses of above prophylactic medications. Patient will need a ride after test due to Benadryl.  On the Day of the Test: Drink plenty of water. Do not drink any water within one hour of the test. Do not eat any food 4 hours prior to the test. You may take your regular medications prior to the test.       Take 25 mg of lopressor (metoprolol) one hour before the test. Take Metoprolol with you in case they need you to take another one After the Test: Drink plenty of water. After receiving IV contrast, you may experience a mild flushed feeling. This is normal. On occasion, you may experience a mild rash up to 24 hours after the test. This is not dangerous. If this occurs, you can take Benadryl 25 mg and increase your fluid intake. If you experience trouble breathing, this can be serious. If it is severe call 911 IMMEDIATELY. If it is mild, please call our office.  Follow-Up: At [facility], you and your health needs are our priority.  As part of our continuing mission to provide you with exceptional heart care, we have created designated Provider Care Teams.  These Care  Teams include your primary Cardiologist (physician) and Advanced Practice Providers (APPs -  Physician Assistants and Nurse Practitioners) who all work together to provide you with the care you need, when you need it.  We recommend signing up for the patient portal called "MyChart".  Sign up information is provided on this After Visit Summary.  MyChart is used to connect with patients for Virtual Visits (Telemedicine).  Patients are able to view lab/test results, encounter notes, upcoming appointments, etc.  Non-urgent messages can be sent to your provider as well.   To learn more about what you  can do with MyChart, go to [email or web address].    Your next appointment:   2 month(s)  Provider:   Norman Herrlich, MD    Other Instructions Cardiac CT Angiogram A cardiac CT angiogram is a procedure to look at the heart and the area around the heart. It may be done to help find the cause of chest pains or other symptoms of heart disease. During this procedure, a substance called contrast dye is injected into a vein in the arm. The contrast highlights the blood vessels in the area to be checked. A large X-ray machine (CT scanner), then takes detailed pictures of the heart and the surrounding area. The procedure is also sometimes called a coronary CT angiogram, coronary artery scanning, or CTA. A cardiac CT angiogram allows the health care provider to see how well blood is flowing to and from the heart. The provider will be able to see if there are any problems, such as: Blockage or narrowing of the arteries in the heart. Fluid around the heart. Signs of weakness or disease in the muscles, valves, and tissues of the heart. Tell a health care provider about: Any allergies you have. This is especially important if you have had a previous allergic reaction to medicines, contrast dye, or iodine. All medicines you are taking, including vitamins, herbs, eye drops, creams, and over-the-counter medicines. Any bleeding problems you have. Any surgeries you have had. Any medical conditions you have, including kidney problems or kidney failure. Whether you are pregnant or may be pregnant. Any anxiety disorders, chronic pain, or other conditions you have. These may increase your stress or prevent you from lying still. Any history of abnormal heart rhythms or heart procedures. What are the risks? Your provider will talk with you about risks. These may include: Bleeding. Infection. Allergic reactions to medicines or dyes. Damage to other structures or organs. Kidney damage from the contrast  dye. Increased risk of cancer from radiation exposure. This risk is low. Talk with your provider about: The risks and benefits of testing. How you can receive the lowest dose of radiation. What happens before the procedure? Wear comfortable clothing and remove any jewelry, glasses, dentures, and hearing aids. Follow instructions from your provider about eating and drinking. These may include: 12 hours before the procedure Avoid caffeine. This includes tea, coffee, soda, energy drinks, and diet pills. Drink plenty of water or other fluids that do not have caffeine in them. Being well hydrated can prevent complications. 4-6 hours before the procedure Stop eating and drinking. This will reduce the risk of nausea from the contrast dye. Ask your provider about changing or stopping your regular medicines. These include: Diabetes medicines. Medicines to treat problems with erections (erectile dysfunction). If you have kidney problems, you may need to receive IV hydration before and after the test. What happens during the procedure?  Hair on your chest may need to be removed so  that small sticky patches called electrodes can be placed on your chest. These will transmit information that helps to monitor your heart during the procedure. An IV will be inserted into one of your veins. You might be given a medicine to control your heart rate during the procedure. This will help to ensure that good images are obtained. You will be asked to lie on an exam table. This table will slide in and out of the CT machine during the procedure. Contrast dye will be injected into the IV. You might feel warm, or you may get a metallic taste in your mouth. You may be given medicines to relax or dilate the arteries in your heart. If you are allergic to contrast dyes or iodine you may be given medicine before the test to reduce the risk of an allergic reaction. The table that you are lying on will move into the CT machine  tunnel for the scan. The person running the machine will give you instructions while the scans are being done. You may be asked to: Keep your arms above your head. Hold your breath for short periods. Stay very still, even if the table is moving. The procedure may vary among providers and hospitals. What can I expect after the procedure? After your procedure, it is common to have: A metallic taste in your mouth from the contrast dye. A feeling of warmth. A headache from the heart medicine. Follow these instructions at home: Take over-the-counter and prescription medicines only as told by your provider. If you are told, drink enough fluid to keep your pee pale yellow. This will help to flush the contrast dye out of your body. Most people can return to their normal activities right after the procedure. Ask your provider what activities are safe for you. It is up to you to get the results of your procedure. Ask your provider, or the department that is doing the procedure, when your results will be ready. Contact a health care provider if: You have any symptoms of allergy to the contrast dye. These include: Shortness of breath. Rash or hives. A racing heartbeat. You notice a change in your peeing (urination). This information is not intended to replace advice given to you by your health care provider. Make sure you discuss any questions you have with your health care provider. Document Revised: 11/24/2021 Document Reviewed: 11/24/2021 Elsevier Patient Education  [address]     Signed, Flossie Dibble, NP  09/18/2022 9:18 AM    [facility]

## 2022-09-18 ENCOUNTER — Ambulatory Visit: Payer: PPO | Attending: Cardiology | Admitting: Cardiology

## 2022-09-18 ENCOUNTER — Ambulatory Visit: Payer: PPO | Attending: Cardiology

## 2022-09-18 ENCOUNTER — Encounter: Payer: Self-pay | Admitting: Cardiology

## 2022-09-18 VITALS — BP 118/76 | HR 60 | Ht 67.0 in | Wt 144.0 lb

## 2022-09-18 DIAGNOSIS — R002 Palpitations: Secondary | ICD-10-CM

## 2022-09-18 DIAGNOSIS — R0602 Shortness of breath: Secondary | ICD-10-CM

## 2022-09-18 DIAGNOSIS — R072 Precordial pain: Secondary | ICD-10-CM | POA: Diagnosis not present

## 2022-09-18 MED ORDER — PREDNISONE 50 MG PO TABS: ORAL_TABLET | ORAL | 0 refills | Status: DC

## 2022-09-18 NOTE — Patient Instructions (Signed)
Medication Instructions:  Your physician recommends that you continue on your current medications as directed. Please refer to the Current Medication list given to you today.  *If you need a refill on your cardiac medications before your next appointment, please call your pharmacy*   Lab Work: Your physician recommends that you return for lab work in: Today for a BMP, Magnesium, CBC, TSH, Liver Function, and Lipid Panel  If you have labs (blood work) drawn today and your tests are completely normal, you will receive your results only by: MyChart Message (if you have MyChart) OR A paper copy in the mail If you have any lab test that is abnormal or we need to change your treatment, we will call you to review the results.   Testing/Procedures: Please arrive at the [facility] main entrance of [facility] at xx:xx AM (30-45 minutes prior to test start time)  [facility] [address] [place], [place] [address] [phone]  Proceed to the [facility] Radiology Department (First Floor).  Please follow these instructions carefully (unless otherwise directed):    On the Night Before the Test: Drink plenty of water. Do not consume any caffeinated/decaffeinated beverages or chocolate 12 hours prior to your test. Do not take any antihistamines 12 hours prior to your test. If you take Metformin do not take 24 hours prior to test. If the patient has contrast allergy: Patient will need a prescription for Prednisone and very clear instructions (as follows): Prednisone 50 mg - take 13 hours prior to test Take another Prednisone 50 mg 7 hours prior to test Take another Prednisone 50 mg 1 hour prior to test Take Benadryl 50 mg 1 hour prior to test Patient must complete all four doses of above prophylactic medications. Patient will need a ride after test due to Benadryl.  On the Day of the Test: Drink plenty of water. Do not drink any water within one hour of the  test. Do not eat any food 4 hours prior to the test. You may take your regular medications prior to the test.       Take 25 mg of lopressor (metoprolol) one hour before the test. Take Metoprolol with you in case they need you to take another one After the Test: Drink plenty of water. After receiving IV contrast, you may experience a mild flushed feeling. This is normal. On occasion, you may experience a mild rash up to 24 hours after the test. This is not dangerous. If this occurs, you can take Benadryl 25 mg and increase your fluid intake. If you experience trouble breathing, this can be serious. If it is severe call 911 IMMEDIATELY. If it is mild, please call our office.  Follow-Up: At [facility], you and your health needs are our priority.  As part of our continuing mission to provide you with exceptional heart care, we have created designated Provider Care Teams.  These Care Teams include your primary Cardiologist (physician) and Advanced Practice Providers (APPs -  Physician Assistants and Nurse Practitioners) who all work together to provide you with the care you need, when you need it.  We recommend signing up for the patient portal called "MyChart".  Sign up information is provided on this After Visit Summary.  MyChart is used to connect with patients for Virtual Visits (Telemedicine).  Patients are able to view lab/test results, encounter notes, upcoming appointments, etc.  Non-urgent messages can be sent to your provider as well.   To learn  more about what you can do with MyChart, go to [email or web address].    Your next appointment:   2 month(s)  Provider:   Norman Herrlich, MD    Other Instructions Cardiac CT Angiogram A cardiac CT angiogram is a procedure to look at the heart and the area around the heart. It may be done to help find the cause of chest pains or other symptoms of heart disease. During this procedure, a substance called contrast dye is injected into a  vein in the arm. The contrast highlights the blood vessels in the area to be checked. A large X-ray machine (CT scanner), then takes detailed pictures of the heart and the surrounding area. The procedure is also sometimes called a coronary CT angiogram, coronary artery scanning, or CTA. A cardiac CT angiogram allows the health care provider to see how well blood is flowing to and from the heart. The provider will be able to see if there are any problems, such as: Blockage or narrowing of the arteries in the heart. Fluid around the heart. Signs of weakness or disease in the muscles, valves, and tissues of the heart. Tell a health care provider about: Any allergies you have. This is especially important if you have had a previous allergic reaction to medicines, contrast dye, or iodine. All medicines you are taking, including vitamins, herbs, eye drops, creams, and over-the-counter medicines. Any bleeding problems you have. Any surgeries you have had. Any medical conditions you have, including kidney problems or kidney failure. Whether you are pregnant or may be pregnant. Any anxiety disorders, chronic pain, or other conditions you have. These may increase your stress or prevent you from lying still. Any history of abnormal heart rhythms or heart procedures. What are the risks? Your provider will talk with you about risks. These may include: Bleeding. Infection. Allergic reactions to medicines or dyes. Damage to other structures or organs. Kidney damage from the contrast dye. Increased risk of cancer from radiation exposure. This risk is low. Talk with your provider about: The risks and benefits of testing. How you can receive the lowest dose of radiation. What happens before the procedure? Wear comfortable clothing and remove any jewelry, glasses, dentures, and hearing aids. Follow instructions from your provider about eating and drinking. These may include: 12 hours before the  procedure Avoid caffeine. This includes tea, coffee, soda, energy drinks, and diet pills. Drink plenty of water or other fluids that do not have caffeine in them. Being well hydrated can prevent complications. 4-6 hours before the procedure Stop eating and drinking. This will reduce the risk of nausea from the contrast dye. Ask your provider about changing or stopping your regular medicines. These include: Diabetes medicines. Medicines to treat problems with erections (erectile dysfunction). If you have kidney problems, you may need to receive IV hydration before and after the test. What happens during the procedure?  Hair on your chest may need to be removed so that small sticky patches called electrodes can be placed on your chest. These will transmit information that helps to monitor your heart during the procedure. An IV will be inserted into one of your veins. You might be given a medicine to control your heart rate during the procedure. This will help to ensure that good images are obtained. You will be asked to lie on an exam table. This table will slide in and out of the CT machine during the procedure. Contrast dye will be injected into the IV. You might feel warm, or  you may get a metallic taste in your mouth. You may be given medicines to relax or dilate the arteries in your heart. If you are allergic to contrast dyes or iodine you may be given medicine before the test to reduce the risk of an allergic reaction. The table that you are lying on will move into the CT machine tunnel for the scan. The person running the machine will give you instructions while the scans are being done. You may be asked to: Keep your arms above your head. Hold your breath for short periods. Stay very still, even if the table is moving. The procedure may vary among providers and hospitals. What can I expect after the procedure? After your procedure, it is common to have: A metallic taste in your mouth  from the contrast dye. A feeling of warmth. A headache from the heart medicine. Follow these instructions at home: Take over-the-counter and prescription medicines only as told by your provider. If you are told, drink enough fluid to keep your pee pale yellow. This will help to flush the contrast dye out of your body. Most people can return to their normal activities right after the procedure. Ask your provider what activities are safe for you. It is up to you to get the results of your procedure. Ask your provider, or the department that is doing the procedure, when your results will be ready. Contact a health care provider if: You have any symptoms of allergy to the contrast dye. These include: Shortness of breath. Rash or hives. A racing heartbeat. You notice a change in your peeing (urination). This information is not intended to replace advice given to you by your health care provider. Make sure you discuss any questions you have with your health care provider. Document Revised: 11/24/2021 Document Reviewed: 11/24/2021 Elsevier Patient Education  2023 [employer].

## 2022-09-19 LAB — BASIC METABOLIC PANEL WITH GFR
BUN/Creatinine Ratio: 21 (ref 9–23)
BUN: 15 mg/dL (ref 6–24)
CO2: 24 mmol/L (ref 20–29)
Calcium: 9.2 mg/dL (ref 8.7–10.2)
Chloride: 106 mmol/L (ref 96–106)
Creatinine, Ser: 0.73 mg/dL (ref 0.57–1.00)
Glucose: 103 mg/dL — ABNORMAL HIGH (ref 70–99)
Potassium: 4.4 mmol/L (ref 3.5–5.2)
Sodium: 142 mmol/L (ref 134–144)
eGFR: 99 mL/min/1.73

## 2022-09-19 LAB — CBC WITH DIFFERENTIAL/PLATELET
Basophils Absolute: 0 x10E3/uL (ref 0.0–0.2)
Basos: 1 %
EOS (ABSOLUTE): 0 x10E3/uL (ref 0.0–0.4)
Eos: 1 %
Hematocrit: 40.4 % (ref 34.0–46.6)
Hemoglobin: 13.3 g/dL (ref 11.1–15.9)
Immature Grans (Abs): 0 x10E3/uL (ref 0.0–0.1)
Immature Granulocytes: 1 %
Lymphocytes Absolute: 1.1 x10E3/uL (ref 0.7–3.1)
Lymphs: 19 %
MCH: 33 pg (ref 26.6–33.0)
MCHC: 32.9 g/dL (ref 31.5–35.7)
MCV: 100 fL — ABNORMAL HIGH (ref 79–97)
Monocytes Absolute: 0.5 x10E3/uL (ref 0.1–0.9)
Monocytes: 8 %
Neutrophils Absolute: 4.1 x10E3/uL (ref 1.4–7.0)
Neutrophils: 70 %
Platelets: 286 x10E3/uL (ref 150–450)
RBC: 4.03 x10E6/uL (ref 3.77–5.28)
RDW: 12.7 % (ref 11.7–15.4)
WBC: 5.8 x10E3/uL (ref 3.4–10.8)

## 2022-09-19 LAB — LIPID PANEL
Chol/HDL Ratio: 3.5 ratio (ref 0.0–4.4)
Cholesterol, Total: 297 mg/dL — ABNORMAL HIGH (ref 100–199)
HDL: 85 mg/dL
LDL Chol Calc (NIH): 203 mg/dL — ABNORMAL HIGH (ref 0–99)
Triglycerides: 63 mg/dL (ref 0–149)
VLDL Cholesterol Cal: 9 mg/dL (ref 5–40)

## 2022-09-19 LAB — HEPATIC FUNCTION PANEL
ALT: 30 IU/L (ref 0–32)
AST: 19 IU/L (ref 0–40)
Albumin: 4.1 g/dL (ref 3.8–4.9)
Alkaline Phosphatase: 71 IU/L (ref 44–121)
Bilirubin Total: 0.4 mg/dL (ref 0.0–1.2)
Bilirubin, Direct: <0.1 mg/dL (ref 0.00–0.40)
Total Protein: 6.2 g/dL (ref 6.0–8.5)

## 2022-09-19 LAB — TSH: TSH: 0.355 u[IU]/mL — ABNORMAL LOW (ref 0.450–4.500)

## 2022-09-19 LAB — MAGNESIUM: Magnesium: 2.3 mg/dL (ref 1.6–2.3)

## 2022-09-20 DIAGNOSIS — M5412 Radiculopathy, cervical region: Secondary | ICD-10-CM | POA: Diagnosis not present

## 2022-09-20 DIAGNOSIS — M961 Postlaminectomy syndrome, not elsewhere classified: Secondary | ICD-10-CM | POA: Diagnosis not present

## 2022-09-20 DIAGNOSIS — Z79899 Other long term (current) drug therapy: Secondary | ICD-10-CM | POA: Diagnosis not present

## 2022-09-20 DIAGNOSIS — G8929 Other chronic pain: Secondary | ICD-10-CM | POA: Diagnosis not present

## 2022-09-20 DIAGNOSIS — M533 Sacrococcygeal disorders, not elsewhere classified: Secondary | ICD-10-CM | POA: Diagnosis not present

## 2022-09-27 ENCOUNTER — Telehealth (HOSPITAL_COMMUNITY): Payer: Self-pay | Admitting: *Deleted

## 2022-09-27 NOTE — Telephone Encounter (Signed)
Attempted to call patient regarding upcoming cardiac CT appointment. °Left message on voicemail with name and callback number ° °Crue Otero RN Navigator Cardiac Imaging °[facility] °[phone] Office °[phone] Cell ° °

## 2022-09-28 ENCOUNTER — Ambulatory Visit (HOSPITAL_COMMUNITY): Admission: RE | Admit: 2022-09-28 | Payer: PPO | Source: Ambulatory Visit

## 2022-09-29 DIAGNOSIS — R0602 Shortness of breath: Secondary | ICD-10-CM | POA: Diagnosis not present

## 2022-09-29 DIAGNOSIS — R002 Palpitations: Secondary | ICD-10-CM | POA: Diagnosis not present

## 2022-10-02 ENCOUNTER — Telehealth: Payer: Self-pay

## 2022-10-02 NOTE — Telephone Encounter (Signed)
-----   Message from Alver Sorrow, NP sent at 10/02/2022  1:20 PM EDT ----- Monitor with predominantly normal sinus rhythm.  All triggered events were sinus rhythm.  No significant pauses nor dangerous arrhythmias.  Good result!  Follow-up as scheduled.

## 2022-10-02 NOTE — Telephone Encounter (Signed)
Patient notified of results.

## 2022-10-05 ENCOUNTER — Telehealth (HOSPITAL_COMMUNITY): Payer: Self-pay | Admitting: Emergency Medicine

## 2022-10-05 NOTE — Telephone Encounter (Signed)
Reaching out to patient to offer assistance regarding upcoming cardiac imaging study; pt verbalizes understanding of appt date/time, parking situation and where to check in, pre-test NPO status and medications ordered, and verified current allergies; name and call back number provided for further questions should they arise Lisa Alexandria RN Navigator Cardiac Imaging [facility] [phone] office [phone] cell  Pt insisted on taking 13 hr prep- states she's taken it for every contrasted scan in the past without fail.

## 2022-10-08 ENCOUNTER — Ambulatory Visit (HOSPITAL_COMMUNITY)
Admission: RE | Admit: 2022-10-08 | Discharge: 2022-10-08 | Disposition: A | Discharge status: Home / Self Care | Payer: PPO | Source: Ambulatory Visit | Attending: Cardiology | Admitting: Cardiology

## 2022-10-08 DIAGNOSIS — R072 Precordial pain: Secondary | ICD-10-CM | POA: Diagnosis present

## 2022-10-08 MED ORDER — NITROGLYCERIN 0.4 MG SL SUBL
SUBLINGUAL_TABLET | SUBLINGUAL | Status: AC
  Filled 2022-10-08: qty 2

## 2022-10-08 MED ORDER — NITROGLYCERIN 0.4 MG SL SUBL
0.8000 mg | SUBLINGUAL_TABLET | Freq: Once | SUBLINGUAL | Status: AC
  Administered 2022-10-08: 0.8 mg via SUBLINGUAL

## 2022-10-08 MED ORDER — IOHEXOL 350 MG/ML SOLN
95.0000 mL | Freq: Once | INTRAVENOUS | Status: AC | PRN
  Administered 2022-10-08: 95 mL via INTRAVENOUS

## 2022-10-16 DIAGNOSIS — N760 Acute vaginitis: Secondary | ICD-10-CM | POA: Diagnosis not present

## 2022-10-16 DIAGNOSIS — N898 Other specified noninflammatory disorders of vagina: Secondary | ICD-10-CM | POA: Diagnosis not present

## 2022-10-16 DIAGNOSIS — N390 Urinary tract infection, site not specified: Secondary | ICD-10-CM | POA: Diagnosis not present

## 2022-11-17 ENCOUNTER — Other Ambulatory Visit: Payer: Self-pay | Admitting: Gastroenterology

## 2022-11-20 DIAGNOSIS — M25561 Pain in right knee: Secondary | ICD-10-CM | POA: Diagnosis not present

## 2022-11-26 DIAGNOSIS — Z79890 Hormone replacement therapy (postmenopausal): Secondary | ICD-10-CM | POA: Diagnosis not present

## 2022-11-26 DIAGNOSIS — R6882 Decreased libido: Secondary | ICD-10-CM | POA: Diagnosis not present

## 2022-11-26 DIAGNOSIS — R5383 Other fatigue: Secondary | ICD-10-CM | POA: Diagnosis not present

## 2022-11-26 DIAGNOSIS — G4700 Insomnia, unspecified: Secondary | ICD-10-CM | POA: Diagnosis not present

## 2022-11-29 ENCOUNTER — Ambulatory Visit: Payer: PPO | Admitting: Cardiology

## 2022-11-30 DIAGNOSIS — M1711 Unilateral primary osteoarthritis, right knee: Secondary | ICD-10-CM | POA: Diagnosis not present

## 2022-12-07 DIAGNOSIS — M1711 Unilateral primary osteoarthritis, right knee: Secondary | ICD-10-CM | POA: Diagnosis not present

## 2022-12-14 DIAGNOSIS — L700 Acne vulgaris: Secondary | ICD-10-CM | POA: Diagnosis not present

## 2022-12-14 DIAGNOSIS — L820 Inflamed seborrheic keratosis: Secondary | ICD-10-CM | POA: Diagnosis not present

## 2022-12-14 DIAGNOSIS — M1711 Unilateral primary osteoarthritis, right knee: Secondary | ICD-10-CM | POA: Diagnosis not present

## 2022-12-18 DIAGNOSIS — M961 Postlaminectomy syndrome, not elsewhere classified: Secondary | ICD-10-CM | POA: Diagnosis not present

## 2022-12-18 DIAGNOSIS — M5136 Other intervertebral disc degeneration, lumbar region: Secondary | ICD-10-CM | POA: Diagnosis not present

## 2022-12-18 DIAGNOSIS — M533 Sacrococcygeal disorders, not elsewhere classified: Secondary | ICD-10-CM | POA: Diagnosis not present

## 2022-12-18 DIAGNOSIS — G894 Chronic pain syndrome: Secondary | ICD-10-CM | POA: Diagnosis not present

## 2022-12-24 DIAGNOSIS — M792 Neuralgia and neuritis, unspecified: Secondary | ICD-10-CM | POA: Diagnosis not present

## 2022-12-24 DIAGNOSIS — M84474S Pathological fracture, right foot, sequela: Secondary | ICD-10-CM | POA: Diagnosis not present

## 2022-12-24 DIAGNOSIS — S99911S Unspecified injury of right ankle, sequela: Secondary | ICD-10-CM | POA: Diagnosis not present

## 2022-12-24 DIAGNOSIS — M67979 Unspecified disorder of synovium and tendon, unspecified ankle and foot: Secondary | ICD-10-CM | POA: Diagnosis not present

## 2022-12-24 DIAGNOSIS — M25571 Pain in right ankle and joints of right foot: Secondary | ICD-10-CM | POA: Diagnosis not present

## 2023-01-02 DIAGNOSIS — M19071 Primary osteoarthritis, right ankle and foot: Secondary | ICD-10-CM | POA: Diagnosis not present

## 2023-01-02 DIAGNOSIS — Z8781 Personal history of (healed) traumatic fracture: Secondary | ICD-10-CM | POA: Diagnosis not present

## 2023-01-02 DIAGNOSIS — M79671 Pain in right foot: Secondary | ICD-10-CM | POA: Diagnosis not present

## 2023-01-02 DIAGNOSIS — R600 Localized edema: Secondary | ICD-10-CM | POA: Diagnosis not present

## 2023-01-27 ENCOUNTER — Other Ambulatory Visit: Payer: Self-pay | Admitting: Gastroenterology

## 2023-02-15 DIAGNOSIS — J4540 Moderate persistent asthma, uncomplicated: Secondary | ICD-10-CM | POA: Diagnosis not present

## 2023-02-15 DIAGNOSIS — J301 Allergic rhinitis due to pollen: Secondary | ICD-10-CM | POA: Diagnosis not present

## 2023-02-15 DIAGNOSIS — G4700 Insomnia, unspecified: Secondary | ICD-10-CM | POA: Diagnosis not present

## 2023-02-15 DIAGNOSIS — R498 Other voice and resonance disorders: Secondary | ICD-10-CM | POA: Diagnosis not present

## 2023-03-20 DIAGNOSIS — G894 Chronic pain syndrome: Secondary | ICD-10-CM | POA: Diagnosis not present

## 2023-03-20 DIAGNOSIS — M961 Postlaminectomy syndrome, not elsewhere classified: Secondary | ICD-10-CM | POA: Diagnosis not present

## 2023-03-20 DIAGNOSIS — M533 Sacrococcygeal disorders, not elsewhere classified: Secondary | ICD-10-CM | POA: Diagnosis not present

## 2023-03-20 DIAGNOSIS — Z79899 Other long term (current) drug therapy: Secondary | ICD-10-CM | POA: Diagnosis not present

## 2023-03-21 DIAGNOSIS — H11153 Pinguecula, bilateral: Secondary | ICD-10-CM | POA: Diagnosis not present

## 2023-03-21 DIAGNOSIS — H524 Presbyopia: Secondary | ICD-10-CM | POA: Diagnosis not present

## 2023-03-21 DIAGNOSIS — H5702 Anisocoria: Secondary | ICD-10-CM | POA: Diagnosis not present

## 2023-03-26 DIAGNOSIS — G4700 Insomnia, unspecified: Secondary | ICD-10-CM | POA: Diagnosis not present

## 2023-03-26 DIAGNOSIS — R498 Other voice and resonance disorders: Secondary | ICD-10-CM | POA: Diagnosis not present

## 2023-03-29 DIAGNOSIS — G4700 Insomnia, unspecified: Secondary | ICD-10-CM | POA: Diagnosis not present

## 2023-03-29 DIAGNOSIS — R498 Other voice and resonance disorders: Secondary | ICD-10-CM | POA: Diagnosis not present

## 2023-03-29 DIAGNOSIS — J301 Allergic rhinitis due to pollen: Secondary | ICD-10-CM | POA: Diagnosis not present

## 2023-03-29 DIAGNOSIS — J4540 Moderate persistent asthma, uncomplicated: Secondary | ICD-10-CM | POA: Diagnosis not present

## 2023-04-09 DIAGNOSIS — Z6823 Body mass index (BMI) 23.0-23.9, adult: Secondary | ICD-10-CM | POA: Diagnosis not present

## 2023-04-09 DIAGNOSIS — G43919 Migraine, unspecified, intractable, without status migrainosus: Secondary | ICD-10-CM | POA: Diagnosis not present

## 2023-04-16 DIAGNOSIS — Z0000 Encounter for general adult medical examination without abnormal findings: Secondary | ICD-10-CM | POA: Diagnosis not present

## 2023-04-16 DIAGNOSIS — G43909 Migraine, unspecified, not intractable, without status migrainosus: Secondary | ICD-10-CM | POA: Diagnosis not present

## 2023-04-16 DIAGNOSIS — T7840XA Allergy, unspecified, initial encounter: Secondary | ICD-10-CM | POA: Diagnosis not present

## 2023-04-16 DIAGNOSIS — H9201 Otalgia, right ear: Secondary | ICD-10-CM | POA: Diagnosis not present

## 2023-04-16 DIAGNOSIS — Z6822 Body mass index (BMI) 22.0-22.9, adult: Secondary | ICD-10-CM | POA: Diagnosis not present

## 2023-04-16 DIAGNOSIS — Z1331 Encounter for screening for depression: Secondary | ICD-10-CM | POA: Diagnosis not present

## 2023-04-16 DIAGNOSIS — Z1339 Encounter for screening examination for other mental health and behavioral disorders: Secondary | ICD-10-CM | POA: Diagnosis not present

## 2023-04-18 DIAGNOSIS — L821 Other seborrheic keratosis: Secondary | ICD-10-CM | POA: Diagnosis not present

## 2023-04-18 DIAGNOSIS — C4441 Basal cell carcinoma of skin of scalp and neck: Secondary | ICD-10-CM | POA: Diagnosis not present

## 2023-04-18 DIAGNOSIS — L814 Other melanin hyperpigmentation: Secondary | ICD-10-CM | POA: Diagnosis not present

## 2023-04-18 DIAGNOSIS — L649 Androgenic alopecia, unspecified: Secondary | ICD-10-CM | POA: Diagnosis not present

## 2023-04-18 DIAGNOSIS — D225 Melanocytic nevi of trunk: Secondary | ICD-10-CM | POA: Diagnosis not present

## 2023-04-22 ENCOUNTER — Ambulatory Visit: Payer: PPO | Admitting: Allergy and Immunology

## 2023-04-22 ENCOUNTER — Encounter: Payer: Self-pay | Admitting: Allergy and Immunology

## 2023-04-22 VITALS — BP 132/78 | HR 76 | Resp 18 | Ht 66.7 in | Wt 137.6 lb

## 2023-04-22 DIAGNOSIS — G43909 Migraine, unspecified, not intractable, without status migrainosus: Secondary | ICD-10-CM

## 2023-04-22 DIAGNOSIS — K200 Eosinophilic esophagitis: Secondary | ICD-10-CM | POA: Diagnosis not present

## 2023-04-22 DIAGNOSIS — J4520 Mild intermittent asthma, uncomplicated: Secondary | ICD-10-CM

## 2023-04-22 DIAGNOSIS — K219 Gastro-esophageal reflux disease without esophagitis: Secondary | ICD-10-CM

## 2023-04-22 DIAGNOSIS — R7989 Other specified abnormal findings of blood chemistry: Secondary | ICD-10-CM

## 2023-04-22 DIAGNOSIS — Z91013 Allergy to seafood: Secondary | ICD-10-CM

## 2023-04-22 DIAGNOSIS — H81313 Aural vertigo, bilateral: Secondary | ICD-10-CM | POA: Diagnosis not present

## 2023-04-22 MED ORDER — MECLIZINE HCL 12.5 MG PO TABS: ORAL_TABLET | ORAL | 4 refills | Status: AC

## 2023-04-22 MED ORDER — PANTOPRAZOLE SODIUM 40 MG PO TBEC: 40.0000 mg | DELAYED_RELEASE_TABLET | Freq: Two times a day (BID) | ORAL | 5 refills | Status: DC

## 2023-04-22 NOTE — Progress Notes (Signed)
[place] [facility] [place] - [place] - [place]   Dear Dr. Welton Flakes,  Thank you for referring Lisa [name] to the [facility] [place] on 04/22/2023.   Below is a summation of this patient's evaluation and recommendations.  Thank you for your referral. I will keep you informed about this patient's response to treatment.   If you have any questions please do not hesitate to contact me.   Sincerely,  Jessica Priest, MD Allergy / Immunology [name]   ______________________________________________________________________    NEW PATIENT NOTE  Referring Provider: Lise Auer, MD Primary Provider: Lise Auer, MD Date of office visit: 04/22/2023    Subjective:   Chief Complaint:  Lisa [name] (DOB: 09-01-1970) is a 52 y.o. female who presents to the clinic on 04/22/2023 with a chief complaint of Allergic Rhinitis  and Migraine .     HPI: Lisa [name] presents to this clinic in evaluation of multiple issues.  I had seen her in this clinic for eosinophilic esophagitis with her last visit occurring 17 June 2017.  Overall she has done pretty well with her eosinophilic esophagitis and she hass tapered off her swallowed steroids and now she relies on the use of omeprazole and famotidine to control her reflux disease.  However, she still continues to have burning in her throat about 1 time per week.  Associated with her burning is lots of postnasal drip and throat clearing and feeling as there is a dry spot in her throat and she has intermittent hoarseness.  She does not only consume any caffeine or chocolate.  She has been diagnosed with asthma and sees a local pulmonologist.  Her asthma is manifested as "chest tightness".  She does not really have much wheezing or coughing.  When she uses the short acting bronchodilator does not really help her.  She has tried a triple inhaler but it gave  rise to thrush.  She has developed vertigo over the course of the past month.  She feels as though the room is spinning and there does not appear to be a positional nature to this vertigo.  Associated with his vertigo is some right ear hypersensitivity in that she can hear sounds much more acutely in that ear than the other ear.  This ear issue has actually been a longstanding issue of many years duration.  She states that she had a episode of shingles affecting the right ear at the age of 22.  She has seen an ENT in the past.  She does not have any associated tinnitus and she does not have any associated hearing loss.  She has migraines of longstanding nature.  She has seen Dr. Neale Burly in the past and was treated with Botox which worked great but it was just a little bit too expensive.  She has failed 2 different types of preventative medicines for her migraines.  Her migraines are associated with photophobia and phonophobia and dizziness and nausea and vomiting and usually make her lay down for a day.  They appear to occur about 1 time per week.  She has recently been given Nurtec.  She had a history of shellfish allergy and she does not consume any shellfish.  Previous evaluation did not identify any shellfish specific IgE antibodies.  She does not receive the flu vaccine.  Past Medical History:  Diagnosis Date   Actinic keratosis of left cheek 01/17/2022  Acute respiratory disease due to COVID-19 virus 04/17/2019   Anemia    Anxiety    Arrhythmia 08/04/2013   Arthropathy of lumbar facet joint 12/28/2021   Bursitis 07/12/2015   Cancer of the skin, basal cell 06/28/2021   Cervical radiculopathy 06/04/2018   Chest discomfort 03/04/2017   Chronic back pain    Chronic constipation    Chronic interstitial cystitis 11/12/2016   Constipation due to opioid therapy 03/21/2015   Coronary artery disease    DDD (degenerative disc disease), cervical 01/02/2019   MRI from February 01, 2017 showed  mild degenerative changes.   DDD (degenerative disc disease), lumbar 01/02/2019   Status post fusion in 2009 and 2010   Depression    Disc disorder 06/09/2013   Disorder of forearm joint 06/09/2013   Disorder of sacrum 08/04/2013   Eosinophilic esophagitis    Episodic mood disorder (HCC) 10/11/2012   Failed back syndrome 02/03/2019   Formatting of this note might be different from the original.  Added automatically from request for surgery [id number]   FH: colon cancer    Former smoker 03/04/2017   GERD (gastroesophageal reflux disease)    Headache    Hiatal hernia    History of Clostridioides difficile infection 01/02/2019   History of endometriosis 01/02/2019   History of kidney stones    History of migraine 01/02/2019   IBS (irritable bowel syndrome)    Lumbar radiculopathy 08/04/2013   Major depressive disorder, single episode, unspecified 06/09/2013   Medication management 09/19/2021   Migraine 11/01/2020   Mohs defect of cheek 09/13/2021   Myofascial pain 06/04/2018   Osteoporosis 04/14/2022   Pneumonia    PONV (postoperative nausea and vomiting)    Post laminectomy syndrome 10/09/2013   Postmenopausal bleeding 09/17/2022   Primary osteoarthritis of both hands 01/02/2019   Clinical and radiographic findings are consistent with osteoarthritis.  All autoimmune work-up was negative.  Use of Voltaren gel and natural anti-inflammatorie   S/P right knee arthroscopy 08/01/2022   SI (sacroiliac) pain 09/09/2015   Skin cancer    Squamous cell carcinoma in situ of skin of left cheek 06/28/2021    Past Surgical History:  Procedure Laterality Date   CHOLECYSTECTOMY  2002   COLONOSCOPY  08/17/2016   Internal hemorrhoids.    DILATION AND CURETTAGE, DIAGNOSTIC / THERAPEUTIC     ESOPHAGOGASTRODUODENOSCOPY  03/01/2017   Gastritis.    KNEE ARTHROSCOPY WITH MEDIAL MENISECTOMY Right 08/01/2022   Procedure: KNEE ARTHROSCOPY WITH MEDIAL MENISECTOMY;  Surgeon: Bjorn Pippin, MD;   Location: [facility];  Service: Orthopedics;  Laterality: Right;   LAPAROSCOPY     x8   LUMBAR FUSION     2009, 2010, 2011   PLACEMENT OF BREAST IMPLANTS      Allergies as of 04/22/2023       Reactions   Amoxicillin Shortness Of Breath   Shellfish Allergy Anaphylaxis   Throat closes    Azithromycin Other (See Comments)   C-diff   Metaxalone Itching   Tape    Blisters skin        Medication List    albuterol 108 (90 Base) MCG/ACT inhaler Commonly known as: VENTOLIN HFA SMARTSIG:2 Puff(s) By Mouth Every 4-6 Hours PRN   Boric Acid Vaginal 600 MG Supp insert 1 capsule vaginally for 7 nights, then twice a week for 3 weeks.   cetirizine 10 MG tablet Commonly known as: ZYRTEC Take 10 mg by mouth daily.   dicyclomine 10 MG capsule Commonly known as: BENTYL  TAKE 1 CAPSULE(10 MG) BY MOUTH FOUR TIMES DAILY AS NEEDED FOR SPASMS   famotidine 40 MG tablet Commonly known as: PEPCID TAKE 1 TABLET(40 MG) BY MOUTH DAILY   gabapentin 600 MG tablet Commonly known as: NEURONTIN Take 600 mg by mouth 3 (three) times daily.   HYDROcodone-acetaminophen 10-325 MG tablet Commonly known as: NORCO Take 1 tablet by mouth every 6 (six) hours as needed.   methocarbamol 750 MG tablet Commonly known as: ROBAXIN Take by mouth.   montelukast 10 MG tablet Commonly known as: SINGULAIR Take 10 mg by mouth daily.   morphine 30 MG 12 hr tablet Commonly known as: MS CONTIN Take by mouth.   Nurtec 75 MG Tbdp Generic drug: Rimegepant Sulfate Take by mouth.   ondansetron 8 MG tablet Commonly known as: ZOFRAN Take 8 mg by mouth every 8 (eight) hours as needed for nausea or vomiting.   pantoprazole 40 MG tablet Commonly known as: PROTONIX Take 1 tablet (40 mg total) by mouth daily.    Review of systems negative except as noted in HPI / PMHx or noted below:  Review of Systems  Constitutional: Negative.   HENT: Negative.    Eyes: Negative.   Respiratory: Negative.    Cardiovascular:  Negative.   Gastrointestinal: Negative.   Genitourinary: Negative.   Musculoskeletal: Negative.   Skin: Negative.   Neurological: Negative.   Endo/Heme/Allergies: Negative.   Psychiatric/Behavioral: Negative.      Family History  Problem Relation Age of Onset   Colon cancer Mother        took 3 feet of colon    Rheumatic fever Mother        age 24-13.5   Alcoholism Father    Alcoholism Brother    Hypertension Brother    Heart attack Maternal Aunt    Stomach cancer Maternal Aunt    Liver cancer Maternal Aunt    Pancreatic cancer Maternal Aunt    Heart attack Maternal Uncle    Lung cancer Maternal Uncle    Kidney cancer Maternal Uncle    Throat cancer Maternal Uncle    Leukemia Paternal Aunt    Esophageal cancer Neg Hx    Rectal cancer Neg Hx     Social History   Socioeconomic History   Marital status: Married    Spouse name: Not on file   Number of children: 0   Years of education: Not on file   Highest education level: Not on file  Occupational History   Occupation: disable  Tobacco Use   Smoking status: Former    Current packs/day: 1.00    Average packs/day: 1 pack/day for 15.0 years (15.0 ttl pk-yrs)    Types: Cigarettes   Smokeless tobacco: Never   Tobacco comments:    Smoked cigarettes for about 20 years.  Quit around 2008  Vaping Use   Vaping status: Never Used  Substance and Sexual Activity   Alcohol use: Never   Drug use: Never   Sexual activity: Not on file  Other Topics Concern   Not on file  Social History Narrative   Not on file   Environmental and Social history  Lives in a house with a dry environment, dogs located inside the household, carpet in the bedroom, no plastic on the pillow, plastic on the bed, and no smoking ongoing with inside the household.  Objective:   Vitals:   04/22/23 0927  BP: 132/78  Pulse: 76  Resp: 18  SpO2: 98%   Height: 5' 6.7" (  169.4 cm) Weight: 137 lb 9.6 oz (62.4 kg)  Physical Exam Constitutional:       Appearance: She is not diaphoretic.  HENT:     Head: Normocephalic.     Right Ear: Tympanic membrane, ear canal and external ear normal.     Left Ear: Tympanic membrane, ear canal and external ear normal.     Nose: Nose normal. No mucosal edema or rhinorrhea.     Mouth/Throat:     Pharynx: Uvula midline. No oropharyngeal exudate.  Eyes:     Conjunctiva/sclera: Conjunctivae normal.  Neck:     Thyroid: No thyromegaly.     Trachea: Trachea normal. No tracheal tenderness or tracheal deviation.  Cardiovascular:     Rate and Rhythm: Normal rate and regular rhythm.     Heart sounds: Normal heart sounds, S1 normal and S2 normal. No murmur heard. Pulmonary:     Effort: No respiratory distress.     Breath sounds: Normal breath sounds. No stridor. No wheezing or rales.  Lymphadenopathy:     Head:     Right side of head: No tonsillar adenopathy.     Left side of head: No tonsillar adenopathy.     Cervical: No cervical adenopathy.  Skin:    Findings: No erythema or rash.     Nails: There is no clubbing.  Neurological:     Mental Status: She is alert.     Diagnostics: Allergy skin tests were not performed.   Spirometry was performed and demonstrated an FEV1 of 2.61 @ 89 % of predicted. FEV1/FVC = 0.82    Results of blood tests obtained 18 Sep 2022 identifies creatinine 0.73 Mg/DL, AST 56O/Z, ALT 30Q/M, WBC 5.8, absolute eosinophil 0, absolute lymphocyte 1100, hemoglobin 13.3, platelet 286, TSH 0.355 Mg/DL  Results of a chest-coronary morphology CT scan obtained 08 October 2022 identifies the following:  Cardiovascular: There are no significant extracardiac vascular findings. Mediastinum/Nodes: There are no enlarged lymph nodes within the visualized mediastinum.  Lungs/Pleura: There is no pleural effusion. The visualized lungs appear clear.  Results of a esophageal biopsy obtained 18 July 2021 identifies the following:  3. Surgical [P], proximal, mid and distal esophagus - BENIGN  SQUAMOUS MUCOSA - NO INCREASED INTRAEPITHELIAL EOSINOPHILS   Assessment and Plan:    1. LPRD (laryngopharyngeal reflux disease)   2. Migraine syndrome   3. Vertigo, aural, bilateral   4. Asthma, mild intermittent, well-controlled   5. Eosinophilic esophagitis   6. Shellfish allergy   7. Low TSH level    1. Treat and prevent vertigo:   A. Use horizon signal to over ride defective inner ear signal  B. Use meclizine 12.5 mg - every 6 hours if needed (sedation)  2. Treat and prevent LPR:   A.  Pantoprazole 40 mg - 2 times per day  B.  Famotidine 40 mg - 1 tablet in PM  C.  Replace throat clearing with swallowing/drinking maneuver  3. Visit with neurology for headaches that have failed Topamax + zonisamide but have responded to botox in the past   4. If needed:   A. Does cetirizine help?  B. Does montelukast help?  C. Does albuterol help?  D. Epi-pen  5. Return for skin testing without antihistamine use.   6. Check blood - TSH, FT4, T3, thyroid peroxidase antibody  Tykia has several issues that require further evaluation.  We will start her treatment for vertigo with the use of meclizine with a warning that this could sometimes cause sedation especially  given that she uses opiates on a chronic basis and we will treat LPR with the therapy noted above and we will refer her onto neurology for her migraines and vertigo.  It does sound as though she has an inner ear defect giving rise to her vertigo and hopefully this will be a transient event but if not we will refer her onto ENT.  I doubt that cetirizine and montelukast and albuterol is really giving her any benefit and she can make a determination about whether or not these actually help her in any way.  She does have a history of shellfish allergy although in the past she has had no shellfish specific IgE antibodies but we will provide her an EpiPen at this point.  We will skin test her when she returns to this clinic to determine  if she has aeroallergen hypersensitivity and also to further investigate her possible shellfish allergy.  Will follow-up her low TSH with the blood test noted above.  Jessica Priest, MD Allergy / Immunology [facility] [place]

## 2023-04-22 NOTE — Patient Instructions (Addendum)
  1. Treat and prevent vertigo:   A. Use horizon signal to over ride defective inner ear signal  B. Use meclizine 12.5 mg - every 6 hours if needed (sedation)  2. Treat and prevent LPR:   A.  Pantoprazole 40 mg - 2 times per day  B.  Famotidine 40 mg - 1 tablet in PM  C.  Replace throat clearing with swallowing/drinking maneuver  3. Visit with neurology for headaches that have failed Topamax + zonisamide but have responded to botox in the past   4. If needed:   A. Does cetirizine help?  B. Does montelukast help?  C. Does albuterol help?  D. Epi-pen  5. Return for skin testing without antihistamine use.   6. Check blood - TSH, FT4, T3, thyroid peroxidase antibody

## 2023-04-23 ENCOUNTER — Telehealth: Payer: Self-pay | Admitting: *Deleted

## 2023-04-23 ENCOUNTER — Encounter: Payer: Self-pay | Admitting: Allergy and Immunology

## 2023-04-23 DIAGNOSIS — R7989 Other specified abnormal findings of blood chemistry: Secondary | ICD-10-CM

## 2023-04-23 NOTE — Telephone Encounter (Signed)
Labs have been ordered and pt informed to get additional testing.

## 2023-04-23 NOTE — Telephone Encounter (Signed)
-----   Message from ERIC J KOZLOW sent at 04/23/2023  7:35 AM EST ----- Please let Lisa [name] know that I looked through all of her blood test and she does have a low TSH that is been documented this year and that needs to be followed up with the following:  6. Check blood - TSH, FT4, T3, thyroid peroxidase antibody

## 2023-04-24 DIAGNOSIS — R7989 Other specified abnormal findings of blood chemistry: Secondary | ICD-10-CM | POA: Diagnosis not present

## 2023-04-25 LAB — TSH+T4F+T3FREE
Free T4: 1.21 ng/dL (ref 0.82–1.77)
T3, Free: 2.5 pg/mL (ref 2.0–4.4)
TSH: 0.314 u[IU]/mL — ABNORMAL LOW (ref 0.450–4.500)

## 2023-04-25 LAB — THYROID PEROXIDASE ANTIBODY: Thyroperoxidase Ab SerPl-aCnc: 10 [IU]/mL (ref 0–34)

## 2023-04-28 ENCOUNTER — Other Ambulatory Visit: Payer: Self-pay | Admitting: Gastroenterology

## 2023-04-29 ENCOUNTER — Ambulatory Visit: Payer: PPO | Admitting: Allergy and Immunology

## 2023-04-29 DIAGNOSIS — J4520 Mild intermittent asthma, uncomplicated: Secondary | ICD-10-CM

## 2023-04-29 DIAGNOSIS — G43909 Migraine, unspecified, not intractable, without status migrainosus: Secondary | ICD-10-CM

## 2023-04-29 DIAGNOSIS — Z91013 Allergy to seafood: Secondary | ICD-10-CM | POA: Diagnosis not present

## 2023-04-30 NOTE — Progress Notes (Signed)
Lisa [name] returns to this clinic for skin testing.  She did not demonstrate any hypersensitivity against a screening panel of aeroallergens, other than ragweed, or foods including negative shellfish skin test. She will continue on the plan prescribed during her initial evaluation and I will see her back in this clinic in 4 weeks.

## 2023-05-07 DIAGNOSIS — F419 Anxiety disorder, unspecified: Secondary | ICD-10-CM | POA: Diagnosis not present

## 2023-05-07 DIAGNOSIS — Z6823 Body mass index (BMI) 23.0-23.9, adult: Secondary | ICD-10-CM | POA: Diagnosis not present

## 2023-05-07 DIAGNOSIS — F32A Depression, unspecified: Secondary | ICD-10-CM | POA: Diagnosis not present

## 2023-05-07 DIAGNOSIS — R1031 Right lower quadrant pain: Secondary | ICD-10-CM | POA: Diagnosis not present

## 2023-05-08 HISTORY — PX: OTHER SURGICAL HISTORY: SHX169

## 2023-05-08 HISTORY — PX: DILATION AND CURETTAGE OF UTERUS: SHX78

## 2023-05-09 DIAGNOSIS — N950 Postmenopausal bleeding: Secondary | ICD-10-CM | POA: Diagnosis not present

## 2023-05-09 DIAGNOSIS — R102 Pelvic and perineal pain: Secondary | ICD-10-CM | POA: Diagnosis not present

## 2023-05-17 ENCOUNTER — Telehealth: Payer: Self-pay | Admitting: Gastroenterology

## 2023-05-17 NOTE — Telephone Encounter (Signed)
 Patient needs PA for dexilant. Please advise.

## 2023-05-20 ENCOUNTER — Other Ambulatory Visit (HOSPITAL_COMMUNITY): Payer: Self-pay

## 2023-05-20 MED ORDER — DEXLANSOPRAZOLE 60 MG PO CPDR: 60.0000 mg | DELAYED_RELEASE_CAPSULE | Freq: Every day | ORAL | 2 refills | Status: DC

## 2023-05-20 NOTE — Telephone Encounter (Signed)
 Done

## 2023-05-24 DIAGNOSIS — C4491 Basal cell carcinoma of skin, unspecified: Secondary | ICD-10-CM | POA: Diagnosis not present

## 2023-05-29 ENCOUNTER — Ambulatory Visit: Payer: PPO | Admitting: Allergy and Immunology

## 2023-05-30 DIAGNOSIS — C4441 Basal cell carcinoma of skin of scalp and neck: Secondary | ICD-10-CM | POA: Diagnosis not present

## 2023-06-11 DIAGNOSIS — N950 Postmenopausal bleeding: Secondary | ICD-10-CM | POA: Diagnosis not present

## 2023-06-11 DIAGNOSIS — R9389 Abnormal findings on diagnostic imaging of other specified body structures: Secondary | ICD-10-CM | POA: Diagnosis not present

## 2023-06-11 DIAGNOSIS — N840 Polyp of corpus uteri: Secondary | ICD-10-CM | POA: Diagnosis not present

## 2023-06-17 DIAGNOSIS — M47816 Spondylosis without myelopathy or radiculopathy, lumbar region: Secondary | ICD-10-CM | POA: Diagnosis not present

## 2023-06-17 DIAGNOSIS — G8929 Other chronic pain: Secondary | ICD-10-CM | POA: Diagnosis not present

## 2023-06-17 DIAGNOSIS — M961 Postlaminectomy syndrome, not elsewhere classified: Secondary | ICD-10-CM | POA: Diagnosis not present

## 2023-06-17 DIAGNOSIS — M533 Sacrococcygeal disorders, not elsewhere classified: Secondary | ICD-10-CM | POA: Diagnosis not present

## 2023-06-19 ENCOUNTER — Telehealth: Payer: Self-pay

## 2023-06-19 NOTE — Telephone Encounter (Signed)
Inbound call from patient, states she has been unable to receive it, because the prior authorization has not been done yet.

## 2023-06-19 NOTE — Telephone Encounter (Signed)
Pharmacy Patient Advocate Encounter   Received notification from CoverMyMeds that prior authorization for Dexlansoprazole 60MG  dr capsules is required/requested.   Insurance verification completed.   The patient is insured through  [employer]  .   Per test claim: PA required; PA submitted to above mentioned insurance via CoverMyMeds Key/confirmation #/EOC [id number] Status is pending

## 2023-06-19 NOTE — Telephone Encounter (Signed)
Please advise

## 2023-06-20 ENCOUNTER — Other Ambulatory Visit (HOSPITAL_COMMUNITY): Payer: Self-pay

## 2023-06-20 NOTE — Telephone Encounter (Signed)
Pharmacy Patient Advocate Encounter  Received notification from [facility] [employer] that Prior Authorization for DEXLANSOPRAZOLE 60MG  has been APPROVED from 2.12.25 to 12.31.25. Unable to obtain price due to refill too soon rejection, last fill date 2.13.25 next available fill date3.8.25

## 2023-06-25 DIAGNOSIS — Z6822 Body mass index (BMI) 22.0-22.9, adult: Secondary | ICD-10-CM | POA: Diagnosis not present

## 2023-06-25 DIAGNOSIS — R319 Hematuria, unspecified: Secondary | ICD-10-CM | POA: Diagnosis not present

## 2023-06-25 DIAGNOSIS — N390 Urinary tract infection, site not specified: Secondary | ICD-10-CM | POA: Diagnosis not present

## 2023-06-25 DIAGNOSIS — N3010 Interstitial cystitis (chronic) without hematuria: Secondary | ICD-10-CM | POA: Diagnosis not present

## 2023-06-28 NOTE — Telephone Encounter (Signed)
 PA request has been Submitted. New Encounter created for follow up. For additional info see Pharmacy Prior Auth telephone encounter from 02/12.

## 2023-07-01 DIAGNOSIS — C4441 Basal cell carcinoma of skin of scalp and neck: Secondary | ICD-10-CM | POA: Diagnosis not present

## 2023-07-02 DIAGNOSIS — Z6822 Body mass index (BMI) 22.0-22.9, adult: Secondary | ICD-10-CM | POA: Diagnosis not present

## 2023-07-02 DIAGNOSIS — J069 Acute upper respiratory infection, unspecified: Secondary | ICD-10-CM | POA: Diagnosis not present

## 2023-07-03 DIAGNOSIS — Z130 Encounter for screening for diseases of the blood and blood-forming organs and certain disorders involving the immune mechanism: Secondary | ICD-10-CM | POA: Diagnosis not present

## 2023-07-03 DIAGNOSIS — Z6822 Body mass index (BMI) 22.0-22.9, adult: Secondary | ICD-10-CM | POA: Diagnosis not present

## 2023-07-03 DIAGNOSIS — Z01419 Encounter for gynecological examination (general) (routine) without abnormal findings: Secondary | ICD-10-CM | POA: Diagnosis not present

## 2023-07-03 DIAGNOSIS — Z1231 Encounter for screening mammogram for malignant neoplasm of breast: Secondary | ICD-10-CM | POA: Diagnosis not present

## 2023-07-03 DIAGNOSIS — Z1151 Encounter for screening for human papillomavirus (HPV): Secondary | ICD-10-CM | POA: Diagnosis not present

## 2023-07-03 DIAGNOSIS — Z124 Encounter for screening for malignant neoplasm of cervix: Secondary | ICD-10-CM | POA: Diagnosis not present

## 2023-07-09 DIAGNOSIS — N840 Polyp of corpus uteri: Secondary | ICD-10-CM | POA: Diagnosis not present

## 2023-07-17 DIAGNOSIS — S838X1A Sprain of other specified parts of right knee, initial encounter: Secondary | ICD-10-CM | POA: Diagnosis not present

## 2023-07-17 DIAGNOSIS — M25532 Pain in left wrist: Secondary | ICD-10-CM | POA: Diagnosis not present

## 2023-07-22 DIAGNOSIS — C4441 Basal cell carcinoma of skin of scalp and neck: Secondary | ICD-10-CM | POA: Diagnosis not present

## 2023-07-23 DIAGNOSIS — M952 Other acquired deformity of head: Secondary | ICD-10-CM | POA: Diagnosis not present

## 2023-07-23 DIAGNOSIS — C4441 Basal cell carcinoma of skin of scalp and neck: Secondary | ICD-10-CM | POA: Diagnosis not present

## 2023-07-23 DIAGNOSIS — Z483 Aftercare following surgery for neoplasm: Secondary | ICD-10-CM | POA: Diagnosis not present

## 2023-07-23 DIAGNOSIS — Z481 Encounter for planned postprocedural wound closure: Secondary | ICD-10-CM | POA: Diagnosis not present

## 2023-07-23 DIAGNOSIS — Z85828 Personal history of other malignant neoplasm of skin: Secondary | ICD-10-CM | POA: Diagnosis not present

## 2023-07-23 DIAGNOSIS — Z9889 Other specified postprocedural states: Secondary | ICD-10-CM | POA: Diagnosis not present

## 2023-07-31 ENCOUNTER — Other Ambulatory Visit: Payer: Self-pay | Admitting: Gastroenterology

## 2023-08-07 DIAGNOSIS — R300 Dysuria: Secondary | ICD-10-CM | POA: Diagnosis not present

## 2023-08-07 DIAGNOSIS — Z8619 Personal history of other infectious and parasitic diseases: Secondary | ICD-10-CM | POA: Diagnosis not present

## 2023-08-09 DIAGNOSIS — M25561 Pain in right knee: Secondary | ICD-10-CM | POA: Diagnosis not present

## 2023-08-13 ENCOUNTER — Ambulatory Visit: Payer: PPO | Admitting: Neurology

## 2023-08-13 ENCOUNTER — Encounter: Payer: Self-pay | Admitting: Neurology

## 2023-08-13 VITALS — BP 117/59 | HR 68 | Ht 67.0 in | Wt 133.0 lb

## 2023-08-13 DIAGNOSIS — R51 Headache with orthostatic component, not elsewhere classified: Secondary | ICD-10-CM | POA: Diagnosis not present

## 2023-08-13 DIAGNOSIS — R292 Abnormal reflex: Secondary | ICD-10-CM | POA: Diagnosis not present

## 2023-08-13 DIAGNOSIS — R519 Headache, unspecified: Secondary | ICD-10-CM

## 2023-08-13 DIAGNOSIS — S82124A Nondisplaced fracture of lateral condyle of right tibia, initial encounter for closed fracture: Secondary | ICD-10-CM | POA: Diagnosis not present

## 2023-08-13 DIAGNOSIS — M25561 Pain in right knee: Secondary | ICD-10-CM | POA: Diagnosis not present

## 2023-08-13 DIAGNOSIS — G43709 Chronic migraine without aura, not intractable, without status migrainosus: Secondary | ICD-10-CM

## 2023-08-13 DIAGNOSIS — H539 Unspecified visual disturbance: Secondary | ICD-10-CM

## 2023-08-13 DIAGNOSIS — M25461 Effusion, right knee: Secondary | ICD-10-CM | POA: Diagnosis not present
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
## 2023-08-13 NOTE — Patient Instructions (Addendum)
 On Ajovy for 2 months, would wait another few months for efficacy Layer on botox on top Other options include: Emgality, Aimovig, Qulipta, Vyepti, Botox, Ubrelvy MRI of the brain w/wo contrast and further imaging as warranted after results    There is increased risk for stroke in women with migraine with aura and a contraindication for the combined contraceptive pill for use by women who have migraine with aura. The risk for women with migraine without aura is lower. However other risk factors like smoking are far more likely to increase stroke risk than migraine. There is a recommendation for no smoking and for the use of OCPs without estrogen such as progestogen only pills particularly for women with migraine with aura.Marland Kitchen People who have migraine headaches with auras may be 3 times more likely to have a stroke caused by a blood clot, compared to migraine patients who don't see auras. Women who take hormone-replacement therapy may be 30 percent more likely to suffer a clot-based stroke than women not taking medication containing estrogen. Other risk factors like smoking and high blood pressure may be  much more important. And stroke is still a rare complication due to migraine aura and is controversial and lower doses may not cause a risk.

## 2023-08-14 ENCOUNTER — Encounter: Payer: Self-pay | Admitting: Neurology

## 2023-08-15 ENCOUNTER — Telehealth: Payer: Self-pay | Admitting: *Deleted

## 2023-08-15 NOTE — Telephone Encounter (Signed)
 Submitted auth request via CMM, status is pending. Key: [id number]

## 2023-08-15 NOTE — Telephone Encounter (Signed)
 Chronic Migraine CPT 64615    Botox [id number] Units:200  G43.709 Chronic Migraine without aura, not intractable, without status migrainous   May see NP

## 2023-08-15 NOTE — Telephone Encounter (Signed)
-----   Message from Anson Fret sent at 08/14/2023  7:22 PM EDT ----- Regarding: please start botox protocol please start botox protocol g43.709 she can go to NP thank you

## 2023-08-16 ENCOUNTER — Other Ambulatory Visit: Payer: Self-pay

## 2023-08-16 ENCOUNTER — Other Ambulatory Visit: Payer: Self-pay | Admitting: Pharmacy Technician

## 2023-08-16 ENCOUNTER — Other Ambulatory Visit (HOSPITAL_COMMUNITY): Payer: Self-pay

## 2023-08-16 ENCOUNTER — Telehealth: Payer: Self-pay | Admitting: Neurology

## 2023-08-16 MED ORDER — BOTOX 200 UNITS IJ SOLR
155.0000 [IU] | INTRAMUSCULAR | 1 refills | Status: DC
  Filled 2023-08-16 – 2023-08-19 (×2): qty 1, 90d supply, fill #0
  Filled 2023-11-04: qty 1, 90d supply, fill #1

## 2023-08-16 NOTE — Addendum Note (Signed)
 Addended by: Judi Cong on: 08/16/2023 08:24 AM   Modules accepted: Orders

## 2023-08-16 NOTE — Telephone Encounter (Signed)
 Received approval from [facility], please send rx to [facility].  Auth#: [id number] (08/15/23-05/06/24)

## 2023-08-16 NOTE — Telephone Encounter (Signed)
 no auth required sent to [facility] [phone]

## 2023-08-19 ENCOUNTER — Other Ambulatory Visit: Payer: Self-pay

## 2023-08-19 ENCOUNTER — Other Ambulatory Visit (HOSPITAL_COMMUNITY): Payer: Self-pay

## 2023-08-19 NOTE — Progress Notes (Signed)
 Specialty Pharmacy Initial Fill Coordination Note  Lisa [name] is a 53 y.o. female contacted today regarding initial fill of specialty medication(s) OnabotulinumtoxinA (Botox)   Patient requested Courier to Provider Office   Delivery date: 08/21/23   Verified address: [facility], [address], [place], [place] [address]   Medication will be filled on 08/20/2023.   Patient is aware of 0 copayment.

## 2023-08-19 NOTE — Progress Notes (Signed)
 RX has been queued in [employer]

## 2023-08-20 ENCOUNTER — Other Ambulatory Visit: Payer: Self-pay | Admitting: Gastroenterology

## 2023-08-20 ENCOUNTER — Other Ambulatory Visit: Payer: Self-pay

## 2023-08-27 ENCOUNTER — Other Ambulatory Visit: Payer: Self-pay | Admitting: Allergy and Immunology

## 2023-08-29 ENCOUNTER — Ambulatory Visit: Admitting: Neurology

## 2023-08-29 ENCOUNTER — Telehealth: Payer: Self-pay | Admitting: Neurology

## 2023-08-29 DIAGNOSIS — G43709 Chronic migraine without aura, not intractable, without status migrainosus: Secondary | ICD-10-CM

## 2023-08-29 MED ORDER — ONABOTULINUMTOXINA 200 UNITS IJ SOLR
155.0000 [IU] | Freq: Once | INTRAMUSCULAR | Status: AC
  Administered 2023-08-29: 155 [IU] via INTRAMUSCULAR

## 2023-08-29 MED ORDER — ALPRAZOLAM 0.5 MG PO TABS: ORAL_TABLET | ORAL | 0 refills | Status: DC

## 2023-08-29 NOTE — Progress Notes (Signed)
 Botox - 200 units x 1 vial Lot: [id number] Expiration: 08/2025 NDC: 4782-9562-13  Bacteriostatic 0.9% Sodium Chloride - 4 mL  Lot: [id number]  Expiration: 03/2024 NDC: 4696-2952-84  Dx: X32.440  S/P  Witnessed by Daphne Eagles

## 2023-08-29 NOTE — Telephone Encounter (Addendum)
 Because pt is claustrophobic, she is asking something be called in for her upcoming MRI.  Pt using [facility] [id number] .  Pt's plastic surgeon will no longer do pt's botox  for her crows feet, she is asking if that can be done here, please call and advise . Call being properly routed to POD 2

## 2023-08-29 NOTE — Telephone Encounter (Signed)
 no auth required sent to [employer] [phone]

## 2023-08-29 NOTE — Telephone Encounter (Signed)
 I just did her Botox  today for migraine headaches. Crow's feet are not included in the protocol and she didn't mention anything about this during our visit. Will give Botox  according to approved protocol. I will send in Xanax  before MRI. Be mindful of potential sedating effects when combined with other chronic medication on her list and use least amount necessary to prevent claustrophobia. (Hydrocodone , Morphine , Gabapentin ).  Meds ordered this encounter  Medications   ALPRAZolam  (XANAX ) 0.5 MG tablet    Sig: Take 1-2 tablets 45 minutes prior to the MRI study, take a third tablet if needed. Must have driver    Dispense:  3 tablet    Refill:  0

## 2023-08-29 NOTE — Progress Notes (Signed)
   BOTOX  PROCEDURE NOTE FOR MIGRAINE HEADACHE  HISTORY: Lisa [name] is here for her first Botox  injection.  She is also on Ajovy. Currently having daily migraines. Uses Maxalt or Nurtec for rescue.  Dr. Margie Sheller was doing trigger point injections, with some benefit.   Description of procedure:  The patient was placed in a sitting position. The standard protocol was used for Botox  as follows, with 5 units of Botox  injected at each site:   -Procerus muscle, midline injection  -Corrugator muscle, bilateral injection  -Frontalis muscle, bilateral injection, with 2 sites each side, medial injection was performed in the upper one third of the frontalis muscle, in the region vertical from the medial inferior edge of the superior orbital rim. The lateral injection was again in the upper one third of the forehead vertically above the lateral limbus of the cornea, 1.5 cm lateral to the medial injection site.  -Temporalis muscle injection, 4 sites, bilaterally. The first injection was 3 cm above the tragus of the ear, second injection site was 1.5 cm to 3 cm up from the first injection site in line with the tragus of the ear. The third injection site was 1.5-3 cm forward between the first 2 injection sites. The fourth injection site was 1.5 cm posterior to the second injection site.  -Occipitalis muscle injection, 3 sites, bilaterally. The first injection was done one half way between the occipital protuberance and the tip of the mastoid process behind the ear. The second injection site was done lateral and superior to the first, 1 fingerbreadth from the first injection. The third injection site was 1 fingerbreadth superiorly and medially from the first injection site.  -Cervical paraspinal muscle injection, 2 sites, bilateral, the first injection site was 1 cm from the midline of the cervical spine, 3 cm inferior to the lower border of the occipital protuberance. The second injection site was 1.5 cm  superiorly and laterally to the first injection site.  -Trapezius muscle injection was performed at 3 sites, bilaterally. The first injection site was in the upper trapezius muscle halfway between the inflection point of the neck, and the acromion. The second injection site was one half way between the acromion and the first injection site. The third injection was done between the first injection site and the inflection point of the neck.   A 200 unit bottle of Botox  was used, 155 units were injected, the rest of the Botox  was wasted. The patient tolerated the procedure well, there were no complications of the above procedure.  Botox  NDC 1308-6578-46 Lot number [id number] Expiration date 08/2025 SP

## 2023-09-12 ENCOUNTER — Inpatient Hospital Stay (HOSPITAL_BASED_OUTPATIENT_CLINIC_OR_DEPARTMENT_OTHER): Admission: RE | Admit: 2023-09-12 | Source: Ambulatory Visit | Admitting: Radiology

## 2023-09-13 DIAGNOSIS — S82124D Nondisplaced fracture of lateral condyle of right tibia, subsequent encounter for closed fracture with routine healing: Secondary | ICD-10-CM | POA: Diagnosis not present

## 2023-09-16 DIAGNOSIS — M533 Sacrococcygeal disorders, not elsewhere classified: Secondary | ICD-10-CM | POA: Diagnosis not present

## 2023-09-16 DIAGNOSIS — Z79899 Other long term (current) drug therapy: Secondary | ICD-10-CM | POA: Diagnosis not present

## 2023-09-16 DIAGNOSIS — M961 Postlaminectomy syndrome, not elsewhere classified: Secondary | ICD-10-CM | POA: Diagnosis not present

## 2023-09-16 DIAGNOSIS — G894 Chronic pain syndrome: Secondary | ICD-10-CM | POA: Diagnosis not present

## 2023-09-16 DIAGNOSIS — M25512 Pain in left shoulder: Secondary | ICD-10-CM | POA: Diagnosis not present

## 2023-10-02 DIAGNOSIS — J309 Allergic rhinitis, unspecified: Secondary | ICD-10-CM | POA: Diagnosis not present

## 2023-10-02 DIAGNOSIS — J45909 Unspecified asthma, uncomplicated: Secondary | ICD-10-CM | POA: Diagnosis not present

## 2023-10-02 DIAGNOSIS — Z6822 Body mass index (BMI) 22.0-22.9, adult: Secondary | ICD-10-CM | POA: Diagnosis not present

## 2023-10-08 DIAGNOSIS — L578 Other skin changes due to chronic exposure to nonionizing radiation: Secondary | ICD-10-CM | POA: Diagnosis not present

## 2023-10-08 DIAGNOSIS — D485 Neoplasm of uncertain behavior of skin: Secondary | ICD-10-CM | POA: Diagnosis not present

## 2023-10-08 DIAGNOSIS — D0439 Carcinoma in situ of skin of other parts of face: Secondary | ICD-10-CM | POA: Diagnosis not present

## 2023-10-08 DIAGNOSIS — L814 Other melanin hyperpigmentation: Secondary | ICD-10-CM | POA: Diagnosis not present

## 2023-10-17 DIAGNOSIS — C44619 Basal cell carcinoma of skin of left upper limb, including shoulder: Secondary | ICD-10-CM | POA: Diagnosis not present

## 2023-10-22 DIAGNOSIS — Z6822 Body mass index (BMI) 22.0-22.9, adult: Secondary | ICD-10-CM | POA: Diagnosis not present

## 2023-10-22 DIAGNOSIS — J45909 Unspecified asthma, uncomplicated: Secondary | ICD-10-CM | POA: Diagnosis not present

## 2023-10-31 ENCOUNTER — Other Ambulatory Visit: Payer: Self-pay | Admitting: Gastroenterology

## 2023-11-04 ENCOUNTER — Other Ambulatory Visit: Payer: Self-pay

## 2023-11-04 NOTE — Progress Notes (Signed)
 Specialty Pharmacy Refill Coordination Note  Lisa [name] is a 53 y.o. female assessed today regarding refills of clinic administered specialty medication(s) OnabotulinumtoxinA  (Botox )   Clinic requested Courier to Provider Office   Delivery date: 11/20/23   Injection date: 11/27/23  Verified address: [facility] [address]  [place],  [place] [address]   Medication will be filled on 11/19/23.

## 2023-11-14 DIAGNOSIS — Z6822 Body mass index (BMI) 22.0-22.9, adult: Secondary | ICD-10-CM | POA: Diagnosis not present

## 2023-11-14 DIAGNOSIS — R233 Spontaneous ecchymoses: Secondary | ICD-10-CM | POA: Diagnosis not present

## 2023-11-14 LAB — LAB REPORT - SCANNED

## 2023-11-19 ENCOUNTER — Other Ambulatory Visit: Payer: Self-pay

## 2023-11-27 ENCOUNTER — Encounter: Payer: Self-pay | Admitting: Neurology

## 2023-11-27 ENCOUNTER — Ambulatory Visit: Admitting: Neurology

## 2023-11-27 VITALS — BP 121/68 | HR 62

## 2023-11-27 DIAGNOSIS — G43709 Chronic migraine without aura, not intractable, without status migrainosus: Secondary | ICD-10-CM | POA: Diagnosis not present

## 2023-11-27 MED ORDER — NURTEC 75 MG PO TBDP: 75.0000 mg | ORAL_TABLET | ORAL | 11 refills | Status: DC | PRN

## 2023-11-27 MED ORDER — RIZATRIPTAN BENZOATE 10 MG PO TBDP: 10.0000 mg | ORAL_TABLET | ORAL | 11 refills | Status: AC | PRN

## 2023-11-27 MED ORDER — ONABOTULINUMTOXINA 200 UNITS IJ SOLR
155.0000 [IU] | Freq: Once | INTRAMUSCULAR | Status: AC
Start: 2023-11-27 — End: 2023-11-27
  Administered 2023-11-27: 155 [IU] via INTRAMUSCULAR

## 2023-11-27 MED ORDER — AJOVY 225 MG/1.5ML ~~LOC~~ SOAJ: 225.0000 mg | SUBCUTANEOUS | 11 refills | Status: AC

## 2023-11-27 MED ORDER — NURTEC 75 MG PO TBDP: 75.0000 mg | ORAL_TABLET | ORAL | Status: DC | PRN

## 2023-11-27 NOTE — Progress Notes (Signed)
 Botox - 200 units x 1 vial Lot: [id number] Expiration: 02/2026 NDC: 9976-6078-97  Bacteriostatic 0.9% Sodium Chloride - 30 mL  Lot: [id number] Expiration: OCT-31-2026 NDC: 9590-8033-97  Dx:G43.709 S/P Witnessed by Maurilio Molt, RN

## 2023-11-27 NOTE — Progress Notes (Signed)
   BOTOX  PROCEDURE NOTE FOR MIGRAINE HEADACHE   HISTORY: Lisa [name] is here for Botox . Last was 08/29/23 with me. Botox  did great. Migraines are 3 times a week, are no longer daily!! Remains on Ajovy . Is out of Nurtec. Also has Maxalt . Has not had MRI brain, she had a right tibial fracture her dog knocked her down. Her PCP was refilling her medications but wants us  to do now.   Description of procedure:  The patient was placed in a sitting position. The standard protocol was used for Botox  as follows, with 5 units of Botox  injected at each site:   -Procerus muscle, midline injection  -Corrugator muscle, bilateral injection  -Frontalis muscle, bilateral injection, with 2 sites each side, medial injection was performed in the upper one third of the frontalis muscle, in the region vertical from the medial inferior edge of the superior orbital rim. The lateral injection was again in the upper one third of the forehead vertically above the lateral limbus of the cornea, 1.5 cm lateral to the medial injection site.  -Temporalis muscle injection, 4 sites, bilaterally. The first injection was 3 cm above the tragus of the ear, second injection site was 1.5 cm to 3 cm up from the first injection site in line with the tragus of the ear. The third injection site was 1.5-3 cm forward between the first 2 injection sites. The fourth injection site was 1.5 cm posterior to the second injection site.  -Occipitalis muscle injection, 3 sites, bilaterally. The first injection was done one half way between the occipital protuberance and the tip of the mastoid process behind the ear. The second injection site was done lateral and superior to the first, 1 fingerbreadth from the first injection. The third injection site was 1 fingerbreadth superiorly and medially from the first injection site.  -Cervical paraspinal muscle injection, 2 sites, bilateral, the first injection site was 1 cm from the midline of the  cervical spine, 3 cm inferior to the lower border of the occipital protuberance. The second injection site was 1.5 cm superiorly and laterally to the first injection site.  -Trapezius muscle injection was performed at 3 sites, bilaterally. The first injection site was in the upper trapezius muscle halfway between the inflection point of the neck, and the acromion. The second injection site was one half way between the acromion and the first injection site. The third injection was done between the first injection site and the inflection point of the neck.   A 200 unit bottle of Botox  was used, 155 units were injected, the rest of the Botox  was wasted. The patient tolerated the procedure well, there were no complications of the above procedure.  Botox  NDC 9976-6078-97 Lot number [id number] Expiration date 02/2026 SP  Excellent benefit with Botox ! Will continue. I will also send her Ajovy , Nurtec and Maxalt . Will give sample of Nurtec today since she is out. Asked about me doing her crows feet around her eyes, advised will stick migraine protocol which does not include crows feet.

## 2023-11-28 DIAGNOSIS — D2239 Melanocytic nevi of other parts of face: Secondary | ICD-10-CM | POA: Diagnosis not present

## 2023-11-28 DIAGNOSIS — C44619 Basal cell carcinoma of skin of left upper limb, including shoulder: Secondary | ICD-10-CM | POA: Diagnosis not present

## 2023-12-19 DIAGNOSIS — N950 Postmenopausal bleeding: Secondary | ICD-10-CM | POA: Diagnosis not present

## 2023-12-23 DIAGNOSIS — M961 Postlaminectomy syndrome, not elsewhere classified: Secondary | ICD-10-CM | POA: Diagnosis not present

## 2023-12-23 DIAGNOSIS — M7061 Trochanteric bursitis, right hip: Secondary | ICD-10-CM | POA: Diagnosis not present

## 2023-12-23 DIAGNOSIS — G894 Chronic pain syndrome: Secondary | ICD-10-CM | POA: Diagnosis not present

## 2023-12-23 DIAGNOSIS — M47816 Spondylosis without myelopathy or radiculopathy, lumbar region: Secondary | ICD-10-CM | POA: Diagnosis not present

## 2023-12-31 DIAGNOSIS — J4540 Moderate persistent asthma, uncomplicated: Secondary | ICD-10-CM | POA: Diagnosis not present

## 2023-12-31 DIAGNOSIS — J4541 Moderate persistent asthma with (acute) exacerbation: Secondary | ICD-10-CM | POA: Diagnosis not present

## 2023-12-31 DIAGNOSIS — G4733 Obstructive sleep apnea (adult) (pediatric): Secondary | ICD-10-CM | POA: Diagnosis not present

## 2023-12-31 DIAGNOSIS — I2699 Other pulmonary embolism without acute cor pulmonale: Secondary | ICD-10-CM | POA: Diagnosis not present

## 2023-12-31 DIAGNOSIS — J301 Allergic rhinitis due to pollen: Secondary | ICD-10-CM | POA: Diagnosis not present

## 2024-01-01 ENCOUNTER — Telehealth: Payer: Self-pay | Admitting: Neurology

## 2024-01-01 NOTE — Telephone Encounter (Signed)
 LVM and sent mychart msg informing pt of appt change - MD schedule change

## 2024-01-13 DIAGNOSIS — J4550 Severe persistent asthma, uncomplicated: Secondary | ICD-10-CM | POA: Diagnosis not present

## 2024-01-13 DIAGNOSIS — G4700 Insomnia, unspecified: Secondary | ICD-10-CM | POA: Diagnosis not present

## 2024-01-13 DIAGNOSIS — J301 Allergic rhinitis due to pollen: Secondary | ICD-10-CM | POA: Diagnosis not present

## 2024-01-13 DIAGNOSIS — G4733 Obstructive sleep apnea (adult) (pediatric): Secondary | ICD-10-CM | POA: Diagnosis not present

## 2024-01-13 DIAGNOSIS — J4540 Moderate persistent asthma, uncomplicated: Secondary | ICD-10-CM | POA: Diagnosis not present

## 2024-01-20 ENCOUNTER — Telehealth: Admitting: Neurology

## 2024-01-31 ENCOUNTER — Other Ambulatory Visit: Payer: Self-pay

## 2024-01-31 ENCOUNTER — Other Ambulatory Visit: Payer: Self-pay | Admitting: Neurology

## 2024-01-31 DIAGNOSIS — N809 Endometriosis, unspecified: Secondary | ICD-10-CM | POA: Insufficient documentation

## 2024-01-31 DIAGNOSIS — C4492 Squamous cell carcinoma of skin, unspecified: Secondary | ICD-10-CM | POA: Insufficient documentation

## 2024-01-31 NOTE — Progress Notes (Deleted)
 Cardiology Office Note:    Date:  01/31/2024   ID:  Lisa [name], DOB 02/07/1971, MRN [id number]  PCP:  Fernand Tracey LABOR, MD   [facility] Providers Cardiologist:  Redell Leiter, MD     Referring MD: Fernand Tracey LABOR, MD   CC: chest tightness  History of Present Illness:    Lisa [name] is a 53 y.o. female with a hx of GERD, IBS, chronic interstitial cystitis, depression, anxiety, former tobacco use.   11/04/2022 coronary CT calcium score of 0 09/18/2022 monitor, no pauses, no episodes of atrial fibrillation, average heart rate 79 bpm predominant rhythm was sinus Echocardiogram on 09/17/2019 revealed an EF of 60 to 65%, trivial MR.  She was previously evaluated by Dr.Folk in 2018 for evaluation of chest pain.  She established with Dr. Leiter on 09/25/2019 for shortness of breath along severe COVID-19 pneumonia the previous year.  Echocardiogram at that time revealed a normal EF and trivial MR.  Most recently evaluated by myself on 09/18/2022 for chest tightness, palpitations.  We arranged a coronary CTA revealing a calcium score of 0, monitor was arranged which was largely normal, We made plans to follow-up in 1 year.   Past Medical History:  Diagnosis Date   Actinic keratosis of left cheek 01/17/2022   Acute respiratory disease due to COVID-19 virus 04/17/2019   Anemia    Anxiety    Arrhythmia 08/04/2013   Arthropathy of lumbar facet joint 12/28/2021   Bursitis 07/12/2015   Cancer of the skin, basal cell 06/28/2021   Cervical radiculopathy 06/04/2018   Chest discomfort 03/04/2017   Chronic back pain    Chronic constipation    Chronic interstitial cystitis 11/12/2016   Constipation due to opioid therapy 03/21/2015   Coronary artery disease    DDD (degenerative disc disease), cervical 01/02/2019   MRI from February 01, 2017 showed mild degenerative changes.   DDD (degenerative disc disease), lumbar 01/02/2019   Status post fusion in 2009 and 2010   Depression     Disc disorder 06/09/2013   Disorder of forearm joint 06/09/2013   Disorder of sacrum 08/04/2013   Endometriosis    Eosinophilic esophagitis    Episodic mood disorder 10/11/2012   Failed back syndrome 02/03/2019   Formatting of this note might be different from the original.  Added automatically from request for surgery [id number]   FH: colon cancer    Former smoker 03/04/2017   GERD (gastroesophageal reflux disease)    Headache    Hiatal hernia    History of Clostridioides difficile infection 01/02/2019   History of endometriosis 01/02/2019   History of kidney stones    History of migraine 01/02/2019   IBS (irritable bowel syndrome)    Lumbar radiculopathy 08/04/2013   Major depressive disorder, single episode, unspecified 06/09/2013   Medication management 09/19/2021   Migraine 11/01/2020   Mohs defect of cheek 09/13/2021   Myofascial pain 06/04/2018   Osteoporosis 04/14/2022   Pneumonia    PONV (postoperative nausea and vomiting)    Post laminectomy syndrome 10/09/2013   Postmenopausal bleeding 09/17/2022   Primary osteoarthritis of both hands 01/02/2019   Clinical and radiographic findings are consistent with osteoarthritis.  All autoimmune work-up was negative.  Use of Voltaren  gel and natural anti-inflammatorie   S/P right knee arthroscopy 08/01/2022   SI (sacroiliac) pain 09/09/2015   Skin cancer    Squamous cell carcinoma in situ of skin of left cheek 06/28/2021   Squamous cell skin cancer  head    Past Surgical History:  Procedure Laterality Date   CHOLECYSTECTOMY  2002   COLONOSCOPY  08/17/2016   Internal hemorrhoids.    DILATION AND CURETTAGE OF UTERUS  2025   for endometriosis,   DILATION AND CURETTAGE, DIAGNOSTIC / THERAPEUTIC     ESOPHAGOGASTRODUODENOSCOPY  03/01/2017   Gastritis.    KNEE ARTHROSCOPY WITH MEDIAL MENISECTOMY Right 08/01/2022   Procedure: KNEE ARTHROSCOPY WITH MEDIAL MENISECTOMY;  Surgeon: Cristy Bonner DASEN, MD;  Location: [facility];  Service:  Orthopedics;  Laterality: Right;   LAPAROSCOPY     x8   LUMBAR FUSION     2009, 2010, 2011   PLACEMENT OF BREAST IMPLANTS     skin cancer removal   2025   from top of head, squamous cell    Current Medications: No outpatient medications have been marked as taking for the 02/04/24 encounter (Appointment) with Carlin Delon BROCKS, NP.     Allergies:   Amoxicillin, Shellfish allergy , Azithromycin, Metaxalone, and Tape   Social History   Socioeconomic History   Marital status: Married    Spouse name: Not on file   Number of children: 0   Years of education: Not on file   Highest education level: Not on file  Occupational History   Occupation: disable  Tobacco Use   Smoking status: Former    Current packs/day: 1.00    Average packs/day: 1 pack/day for 15.0 years (15.0 ttl pk-yrs)    Types: Cigarettes   Smokeless tobacco: Never   Tobacco comments:    Smoked cigarettes for about 20 years.  Quit around 2008  Vaping Use   Vaping status: Never Used  Substance and Sexual Activity   Alcohol use: Never   Drug use: Never   Sexual activity: Not on file  Other Topics Concern   Not on file  Social History Narrative   Right handed   Caffeine: 2 cups/day   Social Drivers of Corporate investment banker Strain: Not on file  Food Insecurity: Not on file  Transportation Needs: Not on file  Physical Activity: Not on file  Stress: Not on file  Social Connections: Not on file     Family History: The patient's family history includes Alcoholism in her brother and father; Colon cancer in her mother; Heart attack in her maternal aunt and maternal uncle; Hypertension in her brother; Kidney cancer in her maternal uncle; Leukemia in her paternal aunt; Liver cancer in her maternal aunt; Lung cancer in her maternal uncle; Migraines in her maternal aunt; Pancreatic cancer in her maternal aunt; Rheumatic fever in her mother; Stomach cancer in her maternal aunt; Throat cancer in her maternal uncle.  There is no history of Esophageal cancer or Rectal cancer.  ROS:   Please see the history of present illness.     All other systems reviewed and are negative.  EKGs/Labs/Other Studies Reviewed:    The following studies were reviewed today: Cardiac Studies & Procedures   ______________________________________________________________________________________________     ECHOCARDIOGRAM  ECHOCARDIOGRAM COMPLETE 09/17/2019  Narrative ECHOCARDIOGRAM REPORT    Patient Name:   ADLEIGH MCMASTERS Date of Exam: 09/17/2019 Medical Rec #:  [id number]       Height:       67.0 in Accession #:    [id number]      Weight:       138.0 lb Date of Birth:  1970/10/22       BSA:          1.727  m Patient Age:    48 years        BP:           112/68 mmHg Patient Gender: F               HR:           82 bpm. Exam Location:  [place]  Procedure: 2D Echo  Indications:    Abnormal ECG 794.31 / R94.31  History:        Patient has no prior history of Echocardiogram examinations. Risk Factors:Former Smoker.  Sonographer:    Lynwood Silvas Referring Phys: [phone] BRIAN J MUNLEY  IMPRESSIONS   1. Left ventricular ejection fraction, by estimation, is 60 to 65%. The left ventricle has normal function. The left ventricle has no regional wall motion abnormalities. Left ventricular diastolic parameters were normal. 2. Right ventricular systolic function is normal. The right ventricular size is normal. There is normal pulmonary artery systolic pressure. 3. The mitral valve is normal in structure. Trivial mitral valve regurgitation. No evidence of mitral stenosis. 4. The aortic valve is normal in structure. Aortic valve regurgitation is not visualized. No aortic stenosis is present. 5. The inferior vena cava is normal in size with greater than 50% respiratory variability, suggesting right atrial pressure of 3 mmHg.  Comparison(s): No prior Echocardiogram.  FINDINGS Left Ventricle: Left ventricular ejection  fraction, by estimation, is 60 to 65%. The left ventricle has normal function. The left ventricle has no regional wall motion abnormalities. The left ventricular internal cavity size was normal in size. There is no left ventricular hypertrophy. Left ventricular diastolic parameters were normal.  Right Ventricle: The right ventricular size is normal. No increase in right ventricular wall thickness. Right ventricular systolic function is normal. There is normal pulmonary artery systolic pressure. The tricuspid regurgitant velocity is 2.36 m/s, and with an assumed right atrial pressure of 3 mmHg, the estimated right ventricular systolic pressure is 25.3 mmHg.  Left Atrium: Left atrial size was normal in size.  Right Atrium: Right atrial size was normal in size.  Pericardium: There is no evidence of pericardial effusion.  Mitral Valve: The mitral valve is normal in structure. Normal mobility of the mitral valve leaflets. Trivial mitral valve regurgitation. No evidence of mitral valve stenosis.  Tricuspid Valve: The tricuspid valve is normal in structure. Tricuspid valve regurgitation is trivial. No evidence of tricuspid stenosis.  Aortic Valve: The aortic valve is normal in structure. Aortic valve regurgitation is not visualized. No aortic stenosis is present.  Pulmonic Valve: The pulmonic valve was normal in structure. Pulmonic valve regurgitation is not visualized. No evidence of pulmonic stenosis.  Aorta: The aortic root is normal in size and structure.  Venous: The inferior vena cava is normal in size with greater than 50% respiratory variability, suggesting right atrial pressure of 3 mmHg.  IAS/Shunts: No atrial level shunt detected by color flow Doppler.   LEFT VENTRICLE PLAX 2D LVIDd:         4.50 cm  Diastology LVIDs:         2.70 cm  LV e' lateral:   13.90 cm/s LV PW:         0.80 cm  LV E/e' lateral: 5.3 LV IVS:        0.80 cm  LV e' medial:    6.20 cm/s LVOT diam:     2.00 cm   LV E/e' medial:  12.0 LV SV:         64 LV  SV Index:   37 LVOT Area:     3.14 cm   RIGHT VENTRICLE             IVC RV S prime:     10.90 cm/s  IVC diam: 1.20 cm TAPSE (M-mode): 2.2 cm  LEFT ATRIUM             Index       RIGHT ATRIUM           Index LA diam:        3.20 cm 1.85 cm/m  RA Area:     13.50 cm LA Vol (A2C):   38.5 ml 22.29 ml/m RA Volume:   30.60 ml  17.72 ml/m LA Vol (A4C):   39.3 ml 22.75 ml/m LA Biplane Vol: 39.5 ml 22.87 ml/m AORTIC VALVE LVOT Vmax:   97.00 cm/s LVOT Vmean:  65.200 cm/s LVOT VTI:    0.204 m  AORTA Ao Root diam: 2.60 cm Ao Asc diam:  2.90 cm  MITRAL VALVE               TRICUSPID VALVE MV Area (PHT): 3.85 cm    TR Peak grad:   22.3 mmHg MV Decel Time: 197 msec    TR Vmax:        236.00 cm/s MV E velocity: 74.10 cm/s MV A velocity: 84.50 cm/s  SHUNTS MV E/A ratio:  0.88        Systemic VTI:  0.20 m Systemic Diam: 2.00 cm  Kardie Tobb DO Electronically signed by Dub Huntsman DO Signature Date/Time: 09/17/2019/5:40:40 PM    Final    MONITORS  LONG TERM MONITOR (3-14 DAYS) 10/02/2022  Narrative Patch Wear Time:  3 days and 8 hours (2024-05-14T08:55:04-399 to 2024-05-17T17:34:45-0400)  There were 3 triggered and 2 diary events all sinus rhythm without ectopy.  There were no pauses of 3 seconds or greater no episodes of second or third-degree AV nodal block.  There are no episodes of atrial fibrillation or flutter.  Patient had a min HR of 50 bpm, max HR of 166 bpm, and avg HR of 79 bpm. Predominant underlying rhythm was Sinus Rhythm.  Isolated SVEs were rare (<1.0%), and no SVE Couplets or SVE Triplets were present.  Isolated VEs were rare (<1.0%), and no VE Couplets or VE Triplets were present.   CT SCANS  CT CORONARY MORPH W/CTA COR W/SCORE 10/08/2022  Addendum 10/13/2022  8:53 PM ADDENDUM REPORT: 10/13/2022 20:51  EXAM: OVER-READ INTERPRETATION  CT CHEST  The following report is an over-read performed by radiologist  Dr. Suzen Dials of [facility] on 10/13/2022. This over-read does not include interpretation of cardiac or coronary anatomy or pathology. The coronary calcium score/coronary CTA interpretation by the cardiologist is attached.  COMPARISON:  April 17, 2019  FINDINGS: Cardiovascular: There are no significant extracardiac vascular findings.  Mediastinum/Nodes: There are no enlarged lymph nodes within the visualized mediastinum.  Lungs/Pleura: There is no pleural effusion. The visualized lungs appear clear.  Upper abdomen: Stable, moderate severity central intrahepatic biliary dilatation is seen.  Musculoskeletal/Chest wall: No chest wall mass or suspicious osseous findings within the visualized chest. A stable, likely benign, 14 mm x 7 mm lytic area is seen within the posterolateral aspect of the T11 vertebral body on the left. Bilateral breast implants are noted.  IMPRESSION: No significant extracardiac findings within the visualized chest.   Electronically Signed By: Suzen Dials M.D. On: 10/13/2022 20:51  Narrative HISTORY: 53 yo female with chest  pain, nonspecific  EXAM: Cardiac/Coronary CTA  TECHNIQUE: The patient was scanned on a [employer].  PROTOCOL: A 100 kV prospective scan was triggered in the descending thoracic aorta at 111 HU's. Axial non-contrast 3 mm slices were carried out through the heart. The data set was analyzed on a dedicated work station and scored using the Agatson method. Gantry rotation speed was 250 msecs and collimation was .6 mm. Beta blockade and 0.8 mg of sl NTG was given. The 3D data set was reconstructed in 5% intervals of the 35-75 % of the R-R cycle. Diastolic phases were analyzed on a dedicated work station using MPR, MIP and VRT modes. The patient received 95mL OMNIPAQUE  IOHEXOL  350 MG/ML SOLN contrast.  FINDINGS: Quality: Fair, mis-registration artifact, HR 62  Coronary calcium score: The  patient's coronary artery calcium score is 0, which places the patient in the 0 percentile.  Coronary arteries: Normal coronary origins.  Right dominance.  Right Coronary Artery: Dominant.  Normal artery.  Left Main Coronary Artery: Normal. Trifurcates into the LAD, RI and LCx arteries.  Left Anterior Descending Coronary Artery: Large anterior artery that wraps around the apex. No disease.  Ramus Intermedius Artery: Small vessel, no disease.  Left Circumflex Artery: Small AV groove vessel that branches laterally, no disease.  Aorta: Normal size, 30 mm at the mid ascending aorta (level of the PA bifurcation) measured double oblique. No calcifications. No dissection.  Aortic Valve: Trileaflet. No calcifications.  Other findings:  Normal pulmonary vein drainage into the left atrium.  Normal left atrial appendage without a thrombus.  Normal size of the pulmonary artery.  IMPRESSION: 1. No evidence of CAD, CADRADS = 0.  2. Coronary artery calcium score is 0, which places the patient in the 0 percentile for age/race and sex-matched control.  3. Normal coronary origin with right dominance.  4. Consider non-coronary causes of chest pain.  Electronically Signed: By: Vinie JAYSON Maxcy M.D. On: 10/08/2022 20:20     ______________________________________________________________________________________________       EKG:  EKG is not ordered today.  Reviewed EKG from 07/29/2022, NSR, heart rate 74 bpm.  Recent Labs: 04/24/2023: TSH 0.314  Recent Lipid Panel    Component Value Date/Time   CHOL 297 (H) 09/18/2022 0904   TRIG 63 09/18/2022 0904   HDL 85 09/18/2022 0904   CHOLHDL 3.5 09/18/2022 0904   LDLCALC 203 (H) 09/18/2022 0904     Risk Assessment/Calculations:      No BP recorded.  {Refresh Note OR Click here to enter BP  :1}***         Physical Exam:    VS:  There were no vitals taken for this visit.    Wt Readings from Last 3 Encounters:  08/13/23 133  lb (60.3 kg)  04/22/23 137 lb 9.6 oz (62.4 kg)  09/18/22 144 lb (65.3 kg)     GEN:  Well nourished, well developed in no acute distress HEENT: Normal NECK: No JVD; No carotid bruits LYMPHATICS: No lymphadenopathy CARDIAC: RRR, no murmurs, rubs, gallops RESPIRATORY:  Clear to auscultation without rales, wheezing or rhonchi  ABDOMEN: Soft, non-tender, non-distended MUSCULOSKELETAL:  No edema; No deformity  SKIN: Warm and dry NEUROLOGIC:  Alert and oriented x 3 PSYCHIATRIC:  Normal affect   ASSESSMENT:    No diagnosis found.  PLAN:    In order of problems listed above:  Precordial chest pain-has been occurring intermittently over the last several weeks, chest pain has mixed features, can occur with exertion and at  rest, accompanied by palpitations.  There is a history of CAD noted in her chart however I do not see any testing that would indicate this--there is notation of a family history of CAD.  Will arrange coronary CTA.  Will check BMET, fasting lipid panel. Palpitations-these have been occurring with chest pain, as well has without chest pain throughout the day, typically occurring most days of the week.  Will arrange for 3-day ZIO monitor.  Will check BMET, CBC, TSH, magnesium . Shortness of breath-could be an anginal equivalent, although atypical sounding.  No current shortness of breath, no tachycardia, SpO2 96% on room air, no indication for VTE workup.  Disposition-Labs per above, coronary CTA, 3 days ago.  Return in 2 months.           Medication Adjustments/Labs and Tests Ordered: Current medicines are reviewed at length with the patient today.  Concerns regarding medicines are outlined above.  No orders of the defined types were placed in this encounter.  No orders of the defined types were placed in this encounter.   There are no Patient Instructions on file for this visit.   Signed, Delon JAYSON Hoover, NP  01/31/2024 11:42 AM     [facility]

## 2024-02-04 ENCOUNTER — Ambulatory Visit: Admitting: Cardiology

## 2024-02-04 DIAGNOSIS — R0602 Shortness of breath: Secondary | ICD-10-CM

## 2024-02-04 DIAGNOSIS — R002 Palpitations: Secondary | ICD-10-CM

## 2024-02-04 DIAGNOSIS — R072 Precordial pain: Secondary | ICD-10-CM

## 2024-02-04 NOTE — Progress Notes (Unsigned)
 Cardiology Office Note:    Date:  02/04/2024   ID:  Lisa [name], DOB [birth year], MRN [id number]  PCP:  Fernand Tracey LABOR, MD   [facility] Providers Cardiologist:  Redell Leiter, MD     Referring MD: Fernand Tracey LABOR, MD   CC: chest tightness  History of Present Illness:    Lisa [name] is a 53 y.o. female with a hx of GERD, IBS, chronic interstitial cystitis, depression, anxiety, former tobacco use.   11/04/2022 coronary CT calcium score of 0 09/18/2022 monitor, no pauses, no episodes of atrial fibrillation, average heart rate 79 bpm predominant rhythm was sinus Echocardiogram on 09/17/2019 revealed an EF of 60 to 65%, trivial MR.  She was previously evaluated by Dr.Folk in 2018 for evaluation of chest pain.  She established with Dr. Leiter on 09/25/2019 for shortness of breath along severe COVID-19 pneumonia the previous year.  Echocardiogram at that time revealed a normal EF and trivial MR.  Most recently evaluated by myself on 09/18/2022 for chest tightness, palpitations.  We arranged a coronary CTA revealing a calcium score of 0, monitor was arranged which was largely normal, We made plans to follow-up in 1 year.   Past Medical History:  Diagnosis Date   Actinic keratosis of left cheek 01/17/2022   Acute respiratory disease due to COVID-19 virus 04/17/2019   Anemia    Anxiety    Arrhythmia 08/04/2013   Arthropathy of lumbar facet joint 12/28/2021   Bursitis 07/12/2015   Cancer of the skin, basal cell 06/28/2021   Cervical radiculopathy 06/04/2018   Chest discomfort 03/04/2017   Chronic back pain    Chronic constipation    Chronic interstitial cystitis 11/12/2016   Constipation due to opioid therapy 03/21/2015   Coronary artery disease    DDD (degenerative disc disease), cervical 01/02/2019   MRI from February 01, 2017 showed mild degenerative changes.   DDD (degenerative disc disease), lumbar 01/02/2019   Status post fusion in 2009 and 2010   Depression     Disc disorder 06/09/2013   Disorder of forearm joint 06/09/2013   Disorder of sacrum 08/04/2013   Endometriosis    Eosinophilic esophagitis    Episodic mood disorder 10/11/2012   Failed back syndrome 02/03/2019   Formatting of this note might be different from the original.  Added automatically from request for surgery [id number]   FH: colon cancer    Former smoker 03/04/2017   GERD (gastroesophageal reflux disease)    Headache    Hiatal hernia    History of Clostridioides difficile infection 01/02/2019   History of endometriosis 01/02/2019   History of kidney stones    History of migraine 01/02/2019   IBS (irritable bowel syndrome)    Lumbar radiculopathy 08/04/2013   Major depressive disorder, single episode, unspecified 06/09/2013   Medication management 09/19/2021   Migraine 11/01/2020   Mohs defect of cheek 09/13/2021   Myofascial pain 06/04/2018   Osteoporosis 04/14/2022   Pneumonia    PONV (postoperative nausea and vomiting)    Post laminectomy syndrome 10/09/2013   Postmenopausal bleeding 09/17/2022   Primary osteoarthritis of both hands 01/02/2019   Clinical and radiographic findings are consistent with osteoarthritis.  All autoimmune work-up was negative.  Use of Voltaren  gel and natural anti-inflammatorie   S/P right knee arthroscopy 08/01/2022   SI (sacroiliac) pain 09/09/2015   Skin cancer    Squamous cell carcinoma in situ of skin of left cheek 06/28/2021   Squamous cell skin cancer  head    Past Surgical History:  Procedure Laterality Date   CHOLECYSTECTOMY  2002   COLONOSCOPY  08/17/2016   Internal hemorrhoids.    DILATION AND CURETTAGE OF UTERUS  2025   for endometriosis,   DILATION AND CURETTAGE, DIAGNOSTIC / THERAPEUTIC     ESOPHAGOGASTRODUODENOSCOPY  03/01/2017   Gastritis.    KNEE ARTHROSCOPY WITH MEDIAL MENISECTOMY Right 08/01/2022   Procedure: KNEE ARTHROSCOPY WITH MEDIAL MENISECTOMY;  Surgeon: Cristy Bonner DASEN, MD;  Location: [facility];  Service:  Orthopedics;  Laterality: Right;   LAPAROSCOPY     x8   LUMBAR FUSION     2009, 2010, 2011   PLACEMENT OF BREAST IMPLANTS     skin cancer removal   2025   from top of head, squamous cell    Current Medications: No outpatient medications have been marked as taking for the 02/05/24 encounter (Appointment) with Carlin Delon BROCKS, NP.     Allergies:   Amoxicillin, Shellfish allergy , Azithromycin, Metaxalone, and Tape   Social History   Socioeconomic History   Marital status: Married    Spouse name: Not on file   Number of children: 0   Years of education: Not on file   Highest education level: Not on file  Occupational History   Occupation: disable  Tobacco Use   Smoking status: Former    Current packs/day: 1.00    Average packs/day: 1 pack/day for 15.0 years (15.0 ttl pk-yrs)    Types: Cigarettes   Smokeless tobacco: Never   Tobacco comments:    Smoked cigarettes for about 20 years.  Quit around 2008  Vaping Use   Vaping status: Never Used  Substance and Sexual Activity   Alcohol use: Never   Drug use: Never   Sexual activity: Not on file  Other Topics Concern   Not on file  Social History Narrative   Right handed   Caffeine: 2 cups/day   Social Drivers of Corporate investment banker Strain: Not on file  Food Insecurity: Not on file  Transportation Needs: Not on file  Physical Activity: Not on file  Stress: Not on file  Social Connections: Not on file     Family History: The patient's family history includes Alcoholism in her brother and father; Colon cancer in her mother; Heart attack in her maternal aunt and maternal uncle; Hypertension in her brother; Kidney cancer in her maternal uncle; Leukemia in her paternal aunt; Liver cancer in her maternal aunt; Lung cancer in her maternal uncle; Migraines in her maternal aunt; Pancreatic cancer in her maternal aunt; Rheumatic fever in her mother; Stomach cancer in her maternal aunt; Throat cancer in her maternal uncle.  There is no history of Esophageal cancer or Rectal cancer.  ROS:   Please see the history of present illness.     All other systems reviewed and are negative.  EKGs/Labs/Other Studies Reviewed:    The following studies were reviewed today: Cardiac Studies & Procedures   ______________________________________________________________________________________________     ECHOCARDIOGRAM  ECHOCARDIOGRAM COMPLETE 09/17/2019  Narrative ECHOCARDIOGRAM REPORT    Patient Name:   GEORGETTE HELMER Date of Exam: 09/17/2019 Medical Rec #:  [id number]       Height:       67.0 in Accession #:    [id number]      Weight:       138.0 lb Date of Birth:  19-Mar-1971       BSA:          1.727  m Patient Age:    48 years        BP:           112/68 mmHg Patient Gender: F               HR:           82 bpm. Exam Location:  [facility]  Procedure: 2D Echo  Indications:    Abnormal ECG 794.31 / R94.31  History:        Patient has no prior history of Echocardiogram examinations. Risk Factors:Former Smoker.  Sonographer:    Lynwood Silvas Referring Phys: [phone] BRIAN J MUNLEY  IMPRESSIONS   1. Left ventricular ejection fraction, by estimation, is 60 to 65%. The left ventricle has normal function. The left ventricle has no regional wall motion abnormalities. Left ventricular diastolic parameters were normal. 2. Right ventricular systolic function is normal. The right ventricular size is normal. There is normal pulmonary artery systolic pressure. 3. The mitral valve is normal in structure. Trivial mitral valve regurgitation. No evidence of mitral stenosis. 4. The aortic valve is normal in structure. Aortic valve regurgitation is not visualized. No aortic stenosis is present. 5. The inferior vena cava is normal in size with greater than 50% respiratory variability, suggesting right atrial pressure of 3 mmHg.  Comparison(s): No prior Echocardiogram.  FINDINGS Left Ventricle: Left ventricular ejection  fraction, by estimation, is 60 to 65%. The left ventricle has normal function. The left ventricle has no regional wall motion abnormalities. The left ventricular internal cavity size was normal in size. There is no left ventricular hypertrophy. Left ventricular diastolic parameters were normal.  Right Ventricle: The right ventricular size is normal. No increase in right ventricular wall thickness. Right ventricular systolic function is normal. There is normal pulmonary artery systolic pressure. The tricuspid regurgitant velocity is 2.36 m/s, and with an assumed right atrial pressure of 3 mmHg, the estimated right ventricular systolic pressure is 25.3 mmHg.  Left Atrium: Left atrial size was normal in size.  Right Atrium: Right atrial size was normal in size.  Pericardium: There is no evidence of pericardial effusion.  Mitral Valve: The mitral valve is normal in structure. Normal mobility of the mitral valve leaflets. Trivial mitral valve regurgitation. No evidence of mitral valve stenosis.  Tricuspid Valve: The tricuspid valve is normal in structure. Tricuspid valve regurgitation is trivial. No evidence of tricuspid stenosis.  Aortic Valve: The aortic valve is normal in structure. Aortic valve regurgitation is not visualized. No aortic stenosis is present.  Pulmonic Valve: The pulmonic valve was normal in structure. Pulmonic valve regurgitation is not visualized. No evidence of pulmonic stenosis.  Aorta: The aortic root is normal in size and structure.  Venous: The inferior vena cava is normal in size with greater than 50% respiratory variability, suggesting right atrial pressure of 3 mmHg.  IAS/Shunts: No atrial level shunt detected by color flow Doppler.   LEFT VENTRICLE PLAX 2D LVIDd:         4.50 cm  Diastology LVIDs:         2.70 cm  LV e' lateral:   13.90 cm/s LV PW:         0.80 cm  LV E/e' lateral: 5.3 LV IVS:        0.80 cm  LV e' medial:    6.20 cm/s LVOT diam:     2.00 cm   LV E/e' medial:  12.0 LV SV:         64 LV  SV Index:   37 LVOT Area:     3.14 cm   RIGHT VENTRICLE             IVC RV S prime:     10.90 cm/s  IVC diam: 1.20 cm TAPSE (M-mode): 2.2 cm  LEFT ATRIUM             Index       RIGHT ATRIUM           Index LA diam:        3.20 cm 1.85 cm/m  RA Area:     13.50 cm LA Vol (A2C):   38.5 ml 22.29 ml/m RA Volume:   30.60 ml  17.72 ml/m LA Vol (A4C):   39.3 ml 22.75 ml/m LA Biplane Vol: 39.5 ml 22.87 ml/m AORTIC VALVE LVOT Vmax:   97.00 cm/s LVOT Vmean:  65.200 cm/s LVOT VTI:    0.204 m  AORTA Ao Root diam: 2.60 cm Ao Asc diam:  2.90 cm  MITRAL VALVE               TRICUSPID VALVE MV Area (PHT): 3.85 cm    TR Peak grad:   22.3 mmHg MV Decel Time: 197 msec    TR Vmax:        236.00 cm/s MV E velocity: 74.10 cm/s MV A velocity: 84.50 cm/s  SHUNTS MV E/A ratio:  0.88        Systemic VTI:  0.20 m Systemic Diam: 2.00 cm  Kardie Tobb DO Electronically signed by Dub Huntsman DO Signature Date/Time: 09/17/2019/5:40:40 PM    Final    MONITORS  LONG TERM MONITOR (3-14 DAYS) 10/02/2022  Narrative Patch Wear Time:  3 days and 8 hours (2024-05-14T08:55:04-399 to 2024-05-17T17:34:45-0400)  There were 3 triggered and 2 diary events all sinus rhythm without ectopy.  There were no pauses of 3 seconds or greater no episodes of second or third-degree AV nodal block.  There are no episodes of atrial fibrillation or flutter.  Patient had a min HR of 50 bpm, max HR of 166 bpm, and avg HR of 79 bpm. Predominant underlying rhythm was Sinus Rhythm.  Isolated SVEs were rare (<1.0%), and no SVE Couplets or SVE Triplets were present.  Isolated VEs were rare (<1.0%), and no VE Couplets or VE Triplets were present.   CT SCANS  CT CORONARY MORPH W/CTA COR W/SCORE 10/08/2022  Addendum 10/13/2022  8:53 PM ADDENDUM REPORT: 10/13/2022 20:51  EXAM: OVER-READ INTERPRETATION  CT CHEST  The following report is an over-read performed by radiologist  Dr. Suzen Dials of [facility] on 10/13/2022. This over-read does not include interpretation of cardiac or coronary anatomy or pathology. The coronary calcium score/coronary CTA interpretation by the cardiologist is attached.  COMPARISON:  April 17, 2019  FINDINGS: Cardiovascular: There are no significant extracardiac vascular findings.  Mediastinum/Nodes: There are no enlarged lymph nodes within the visualized mediastinum.  Lungs/Pleura: There is no pleural effusion. The visualized lungs appear clear.  Upper abdomen: Stable, moderate severity central intrahepatic biliary dilatation is seen.  Musculoskeletal/Chest wall: No chest wall mass or suspicious osseous findings within the visualized chest. A stable, likely benign, 14 mm x 7 mm lytic area is seen within the posterolateral aspect of the T11 vertebral body on the left. Bilateral breast implants are noted.  IMPRESSION: No significant extracardiac findings within the visualized chest.   Electronically Signed By: Suzen Dials M.D. On: 10/13/2022 20:51  Narrative HISTORY: 53 yo female with chest  pain, nonspecific  EXAM: Cardiac/Coronary CTA  TECHNIQUE: The patient was scanned on a [employer].  PROTOCOL: A 100 kV prospective scan was triggered in the descending thoracic aorta at 111 HU's. Axial non-contrast 3 mm slices were carried out through the heart. The data set was analyzed on a dedicated work station and scored using the Agatson method. Gantry rotation speed was 250 msecs and collimation was .6 mm. Beta blockade and 0.8 mg of sl NTG was given. The 3D data set was reconstructed in 5% intervals of the 35-75 % of the R-R cycle. Diastolic phases were analyzed on a dedicated work station using MPR, MIP and VRT modes. The patient received 95mL OMNIPAQUE  IOHEXOL  350 MG/ML SOLN contrast.  FINDINGS: Quality: Fair, mis-registration artifact, HR 62  Coronary calcium score: The  patient's coronary artery calcium score is 0, which places the patient in the 0 percentile.  Coronary arteries: Normal coronary origins.  Right dominance.  Right Coronary Artery: Dominant.  Normal artery.  Left Main Coronary Artery: Normal. Trifurcates into the LAD, RI and LCx arteries.  Left Anterior Descending Coronary Artery: Large anterior artery that wraps around the apex. No disease.  Ramus Intermedius Artery: Small vessel, no disease.  Left Circumflex Artery: Small AV groove vessel that branches laterally, no disease.  Aorta: Normal size, 30 mm at the mid ascending aorta (level of the PA bifurcation) measured double oblique. No calcifications. No dissection.  Aortic Valve: Trileaflet. No calcifications.  Other findings:  Normal pulmonary vein drainage into the left atrium.  Normal left atrial appendage without a thrombus.  Normal size of the pulmonary artery.  IMPRESSION: 1. No evidence of CAD, CADRADS = 0.  2. Coronary artery calcium score is 0, which places the patient in the 0 percentile for age/race and sex-matched control.  3. Normal coronary origin with right dominance.  4. Consider non-coronary causes of chest pain.  Electronically Signed: By: Vinie JAYSON Maxcy M.D. On: 10/08/2022 20:20     ______________________________________________________________________________________________       EKG:  EKG is not ordered today.  Reviewed EKG from 07/29/2022, NSR, heart rate 74 bpm.  Recent Labs: 04/24/2023: TSH 0.314  Recent Lipid Panel    Component Value Date/Time   CHOL 297 (H) 09/18/2022 0904   TRIG 63 09/18/2022 0904   HDL 85 09/18/2022 0904   CHOLHDL 3.5 09/18/2022 0904   LDLCALC 203 (H) 09/18/2022 0904     Risk Assessment/Calculations:      No BP recorded.  {Refresh Note OR Click here to enter BP  :1}***         Physical Exam:    VS:  There were no vitals taken for this visit.    Wt Readings from Last 3 Encounters:  08/13/23 133  lb (60.3 kg)  04/22/23 137 lb 9.6 oz (62.4 kg)  09/18/22 144 lb (65.3 kg)     GEN:  Well nourished, well developed in no acute distress HEENT: Normal NECK: No JVD; No carotid bruits LYMPHATICS: No lymphadenopathy CARDIAC: RRR, no murmurs, rubs, gallops RESPIRATORY:  Clear to auscultation without rales, wheezing or rhonchi  ABDOMEN: Soft, non-tender, non-distended MUSCULOSKELETAL:  No edema; No deformity  SKIN: Warm and dry NEUROLOGIC:  Alert and oriented x 3 PSYCHIATRIC:  Normal affect   ASSESSMENT:    No diagnosis found.  PLAN:    In order of problems listed above:  Precordial chest pain-has been occurring intermittently over the last several weeks, chest pain has mixed features, can occur with exertion and at  rest, accompanied by palpitations.  There is a history of CAD noted in her chart however I do not see any testing that would indicate this--there is notation of a family history of CAD.  Will arrange coronary CTA.  Will check BMET, fasting lipid panel. Palpitations-these have been occurring with chest pain, as well has without chest pain throughout the day, typically occurring most days of the week.  Will arrange for 3-day ZIO monitor.  Will check BMET, CBC, TSH, magnesium . Shortness of breath-could be an anginal equivalent, although atypical sounding.  No current shortness of breath, no tachycardia, SpO2 96% on room air, no indication for VTE workup.  Disposition-Labs per above, coronary CTA, 3 days ago.  Return in 2 months.           Medication Adjustments/Labs and Tests Ordered: Current medicines are reviewed at length with the patient today.  Concerns regarding medicines are outlined above.  No orders of the defined types were placed in this encounter.  No orders of the defined types were placed in this encounter.   There are no Patient Instructions on file for this visit.   Signed, Delon JAYSON Hoover, NP  02/04/2024 11:55 AM    [facility]

## 2024-02-05 ENCOUNTER — Encounter: Payer: Self-pay | Admitting: Cardiology

## 2024-02-05 ENCOUNTER — Ambulatory Visit: Attending: Cardiology | Admitting: Cardiology

## 2024-02-05 VITALS — BP 110/66 | HR 56 | Ht 67.0 in | Wt 136.0 lb

## 2024-02-05 DIAGNOSIS — R0602 Shortness of breath: Secondary | ICD-10-CM | POA: Diagnosis not present

## 2024-02-05 DIAGNOSIS — R072 Precordial pain: Secondary | ICD-10-CM

## 2024-02-05 DIAGNOSIS — R002 Palpitations: Secondary | ICD-10-CM

## 2024-02-05 MED ORDER — METOPROLOL TARTRATE 25 MG PO TABS: 25.0000 mg | ORAL_TABLET | Freq: Two times a day (BID) | ORAL | 3 refills | Status: AC

## 2024-02-05 MED ORDER — AMLODIPINE BESYLATE 2.5 MG PO TABS: 2.5000 mg | ORAL_TABLET | Freq: Every evening | ORAL | 3 refills | Status: DC

## 2024-02-05 NOTE — Patient Instructions (Signed)
 Medication Instructions:  Your physician has recommended you make the following change in your medication:  Start Amlodipine 2.5mg  to take daily at night Continue Metoprolol  Tartrate 25 mg two times daily  *If you need a refill on your cardiac medications before your next appointment, please call your pharmacy*  Lab Work: NONE If you have labs (blood work) drawn today and your tests are completely normal, you will receive your results only by: MyChart Message (if you have MyChart) OR A paper copy in the mail If you have any lab test that is abnormal or we need to change your treatment, we will call you to review the results.  Testing/Procedures: NONE  Follow-Up: At [facility], you and your health needs are our priority.  As part of our continuing mission to provide you with exceptional heart care, our providers are all part of one team.  This team includes your primary Cardiologist (physician) and Advanced Practice Providers or APPs (Physician Assistants and Nurse Practitioners) who all work together to provide you with the care you need, when you need it.  Your next appointment:   6 month(s)  Provider:   Redell Leiter, MD    We recommend signing up for the patient portal called MyChart.  Sign up information is provided on this After Visit Summary.  MyChart is used to connect with patients for Virtual Visits (Telemedicine).  Patients are able to view lab/test results, encounter notes, upcoming appointments, etc.  Non-urgent messages can be sent to your provider as well.   To learn more about what you can do with MyChart, go to [email or web address].   Other Instructions

## 2024-02-06 ENCOUNTER — Ambulatory Visit: Admitting: Physician Assistant

## 2024-02-06 NOTE — Progress Notes (Deleted)
 02/06/2024 CHARLESIA CANADAY [id number] 04/07/1971  Referring provider: Fernand Tracey LABOR, MD Primary GI doctor: Dr. Charlanne  ASSESSMENT AND PLAN:  Dysphagia with history of EOE/GERD 07/18/2021 EGD for dysphagia dilation, few superficial linear furrows, gastritis, normal duodenum.  Biopsy showed negative H. pylori, negative EOE  IBS with constipation/drug-induced constipation History of C. difficile/endometriosis/ICS 2019 CTAP W moderate colonic stool volume, cholecystectomy, mild diffuse biliary ductal dilation 12 mm  Family history of colon cancer (mom at age 2) 07/18/2021 adequate prep unremarkable, normal TI internal hemorrhoids recall 5 years  Moderate intrahepatic biliary ductal dilation seen on coronary calcium score Status post cholecystectomy and on chronic opioid therapy 2019 CTAP W mild diffuse intrahepatic biliary ductal dilation dilated CBD 12 mm increase since 2012 most likely chronic postcholecystectomy effect RUQ US /2020 CBD 4.7 status post cholecystectomy unremarkable liver Previously elevated LFTs December 2020 most recent May 2024 unremarkable liver function  Chronic pain syndrome On oxycodone   Patient Care Team: Fernand Tracey LABOR, MD as PCP - General (Family Medicine) Monetta Redell PARAS, MD as PCP - Cardiology (Cardiology)  HISTORY OF PRESENT ILLNESS: 53 y.o. female with a past medical history listed below presents for evaluation of ***.   Last seen in the office 06/22/2021 by Dr. Charlanne for GERD, IBS with constipation, EOE and family history of colon cancer.  *** Discussed the use of AI scribe software for clinical note transcription with the patient, who gave verbal consent to proceed.  History of Present Illness            She  reports that she has quit smoking. Her smoking use included cigarettes. She has a 15 pack-year smoking history. She has never used smokeless tobacco. She reports that she does not drink alcohol and does not use drugs.  RELEVANT GI  HISTORY, IMAGING AND LABS: Results         10/08/2022 coronary calcium score 0 CBC    Component Value Date/Time   WBC 5.8 09/18/2022 0904   WBC 4.0 07/26/2022 1008   RBC 4.03 09/18/2022 0904   RBC 4.05 07/26/2022 1008   HGB 13.3 09/18/2022 0904   HCT 40.4 09/18/2022 0904   PLT 286 09/18/2022 0904   MCV 100 (H) 09/18/2022 0904   MCH 33.0 09/18/2022 0904   MCH 31.9 07/26/2022 1008   MCHC 32.9 09/18/2022 0904   MCHC 32.9 07/26/2022 1008   RDW 12.7 09/18/2022 0904   LYMPHSABS 1.1 09/18/2022 0904   MONOABS 0.6 04/21/2019 0407   EOSABS 0.0 09/18/2022 0904   BASOSABS 0.0 09/18/2022 0904   No results for input(s): HGB in the last 8760 hours.  CMP     Component Value Date/Time   NA 142 09/18/2022 0904   K 4.4 09/18/2022 0904   CL 106 09/18/2022 0904   CO2 24 09/18/2022 0904   GLUCOSE 103 (H) 09/18/2022 0904   GLUCOSE 109 (H) 07/26/2022 1008   BUN 15 09/18/2022 0904   CREATININE 0.73 09/18/2022 0904   CALCIUM 9.2 09/18/2022 0904   PROT 6.2 09/18/2022 0904   ALBUMIN 4.1 09/18/2022 0904   AST 19 09/18/2022 0904   ALT 30 09/18/2022 0904   ALKPHOS 71 09/18/2022 0904   BILITOT 0.4 09/18/2022 0904   GFRNONAA >60 07/26/2022 1008   GFRAA >60 04/21/2019 0407      Latest Ref Rng & Units 09/18/2022    9:04 AM 04/21/2019    4:07 AM 04/20/2019    4:07 AM  Hepatic Function  Total Protein 6.0 - 8.5  g/dL 6.2  6.4  6.6   Albumin 3.8 - 4.9 g/dL 4.1  3.5  3.5   AST 0 - 40 IU/L 19  83  64   ALT 0 - 32 IU/L 30  132  115   Alk Phosphatase 44 - 121 IU/L 71  284  261   Total Bilirubin 0.0 - 1.2 mg/dL 0.4  0.5  0.2   Bilirubin, Direct 0.00 - 0.40 mg/dL <9.89         Current Medications:    Current Outpatient Medications (Cardiovascular):    amLODipine (NORVASC) 2.5 MG tablet, Take 1 tablet (2.5 mg total) by mouth every evening.   metoprolol  tartrate (LOPRESSOR ) 25 MG tablet, Take 1 tablet (25 mg total) by mouth 2 (two) times daily.  Current Outpatient Medications (Respiratory):     albuterol  (VENTOLIN  HFA) 108 (90 Base) MCG/ACT inhaler, SMARTSIG:2 Puff(s) By Mouth Every 4-6 Hours PRN   montelukast (SINGULAIR) 10 MG tablet, Take 10 mg by mouth daily.   TRELEGY ELLIPTA 100-62.5-25 MCG/ACT AEPB, Inhale 1 puff into the lungs daily.  Current Outpatient Medications (Analgesics):    Fremanezumab -vfrm (AJOVY ) 225 MG/1.5ML SOAJ, Inject 225 mg into the skin every 30 (thirty) days.   HYDROcodone -acetaminophen  (NORCO) 10-325 MG tablet, Take 1 tablet by mouth every 6 (six) hours as needed.   Rimegepant Sulfate (NURTEC) 75 MG TBDP, Take 1 tablet (75 mg total) by mouth as needed.   rizatriptan  (MAXALT -MLT) 10 MG disintegrating tablet, Take 1 tablet (10 mg total) by mouth as needed for migraine. May repeat in 2 hours if needed   Current Outpatient Medications (Other):    ALPRAZolam  (XANAX ) 0.5 MG tablet, Take 1-2 tablets 45 minutes prior to the MRI study, take a third tablet if needed. Must have driver   Boric Acid Vaginal 600 MG SUPP,    botulinum toxin Type A  (BOTOX ) 200 units injection, Inject 155 Units into the muscle every 3 (three) months.   dexlansoprazole  (DEXILANT ) 60 MG capsule, TAKE 1 CAPSULE(60 MG) BY MOUTH DAILY   dicyclomine  (BENTYL ) 10 MG capsule, TAKE 1 CAPSULE(10 MG) BY MOUTH FOUR TIMES DAILY AS NEEDED FOR SPASMS   famotidine  (PEPCID ) 40 MG tablet, Take 1 tablet (40 mg total) by mouth daily. Please call [phone] to schedule an office visit for more refills   gabapentin  (NEURONTIN ) 600 MG tablet, Take 600 mg by mouth 3 (three) times daily.   meclizine  (ANTIVERT ) 12.5 MG tablet, Can take one tablet by mouth every 6 hours if needed.   methocarbamol  (ROBAXIN ) 750 MG tablet, Take by mouth.   ondansetron  (ZOFRAN ) 8 MG tablet, Take 8 mg by mouth every 8 (eight) hours as needed for nausea or vomiting.   pantoprazole  (PROTONIX ) 40 MG tablet, TAKE 1 TABLET(40 MG) BY MOUTH TWICE DAILY   scopolamine  (TRANSDERM-SCOP) 1 MG/3DAYS, Place 1 patch onto the skin every three (3) days  as needed (dizziness).   VESICARE 10 MG tablet, Take 10 mg by mouth as needed (sistitis).  Medical History:  Past Medical History:  Diagnosis Date   Actinic keratosis of left cheek 01/17/2022   Acute respiratory disease due to COVID-19 virus 04/17/2019   Anemia    Anxiety    Arrhythmia 08/04/2013   Arthropathy of lumbar facet joint 12/28/2021   Bursitis 07/12/2015   Cancer of the skin, basal cell 06/28/2021   Cervical radiculopathy 06/04/2018   Chest discomfort 03/04/2017   Chronic back pain    Chronic constipation    Chronic interstitial cystitis 11/12/2016   Constipation due  to opioid therapy 03/21/2015   Coronary artery disease    DDD (degenerative disc disease), cervical 01/02/2019   MRI from February 01, 2017 showed mild degenerative changes.   DDD (degenerative disc disease), lumbar 01/02/2019   Status post fusion in 2009 and 2010   Depression    Disc disorder 06/09/2013   Disorder of forearm joint 06/09/2013   Disorder of sacrum 08/04/2013   Endometriosis    Eosinophilic esophagitis    Episodic mood disorder 10/11/2012   Failed back syndrome 02/03/2019   Formatting of this note might be different from the original.  Added automatically from request for surgery [id number]   FH: colon cancer    Former smoker 03/04/2017   GERD (gastroesophageal reflux disease)    Headache    Hiatal hernia    History of Clostridioides difficile infection 01/02/2019   History of endometriosis 01/02/2019   History of kidney stones    History of migraine 01/02/2019   IBS (irritable bowel syndrome)    Lumbar radiculopathy 08/04/2013   Major depressive disorder, single episode, unspecified 06/09/2013   Medication management 09/19/2021   Migraine 11/01/2020   Mohs defect of cheek 09/13/2021   Myofascial pain 06/04/2018   Osteoporosis 04/14/2022   Pneumonia    PONV (postoperative nausea and vomiting)    Post laminectomy syndrome 10/09/2013   Postmenopausal bleeding 09/17/2022   Primary  osteoarthritis of both hands 01/02/2019   Clinical and radiographic findings are consistent with osteoarthritis.  All autoimmune work-up was negative.  Use of Voltaren  gel and natural anti-inflammatorie   S/P right knee arthroscopy 08/01/2022   SI (sacroiliac) pain 09/09/2015   Skin cancer    Squamous cell carcinoma in situ of skin of left cheek 06/28/2021   Squamous cell skin cancer    head   Allergies:  Allergies  Allergen Reactions   Amoxicillin Shortness Of Breath   Shellfish Allergy  Anaphylaxis    Throat closes    Azithromycin Other (See Comments)    C-diff   Metaxalone Itching   Tape     Blisters skin     Surgical History:  She  has a past surgical history that includes Cholecystectomy (2002); Placement of breast implants; Esophagogastroduodenoscopy (03/01/2017); Colonoscopy (08/17/2016); Lumbar fusion; laparoscopy; Knee arthroscopy with medial menisectomy (Right, 08/01/2022); Dilation and curettage, diagnostic / therapeutic; skin cancer removal  (2025); and Dilation and curettage of uterus (2025). Family History:  Her family history includes Alcoholism in her brother and father; Colon cancer in her mother; Heart attack in her maternal aunt and maternal uncle; Hypertension in her brother; Kidney cancer in her maternal uncle; Leukemia in her paternal aunt; Liver cancer in her maternal aunt; Lung cancer in her maternal uncle; Migraines in her maternal aunt; Pancreatic cancer in her maternal aunt; Rheumatic fever in her mother; Stomach cancer in her maternal aunt; Throat cancer in her maternal uncle.  REVIEW OF SYSTEMS  : All other systems reviewed and negative except where noted in the History of Present Illness.  PHYSICAL EXAM: There were no vitals taken for this visit. Physical Exam          Alan JONELLE Coombs, PA-C 7:40 AM

## 2024-02-10 ENCOUNTER — Other Ambulatory Visit: Payer: Self-pay | Admitting: Gastroenterology

## 2024-02-10 ENCOUNTER — Encounter: Payer: Self-pay | Admitting: Neurology

## 2024-02-10 ENCOUNTER — Other Ambulatory Visit: Payer: Self-pay

## 2024-02-11 NOTE — Progress Notes (Unsigned)
 02/12/2024 Lisa [name] [id number] 05-12-1970  Referring provider: Fernand Tracey LABOR, MD Primary GI doctor: Dr. Charlanne  ASSESSMENT AND PLAN:  Dysphagia with history of EOE/GERD 07/18/2021 EGD for dysphagia dilation, few superficial linear furrows, gastritis, normal duodenum.  Biopsy showed negative H. pylori, negative EOE On dexilant  and pantoprazle Has been on diflofenac 75 mg BID for 1-2 months, prednisone  the month before that for asthma Has had worsening allergies, asthma Persistent dysphagia and abdominal pain with possible EOE, diclofenac -induced esophagitis, or esophageal candidiasis. Diclofenac  may exacerbate symptoms. - Stop diclofenac . - Continue Dexilant  and pantoprazole . - Initiate alginate therapy for reflux. - Prescribe Carafate (sucralfate) for 10 days. - Consider EGD if symptoms do not improve and if there is evidence of EOE may be a good candidate for dupixent, given information. -Lifestyle changes discussed, avoid NSAIDS, ETOH, hand out given to the patient -gastroparesis diet given with history of opoid use  IBS with constipation/drug-induced constipation History of C. difficile/endometriosis/ICS 2019 CTAP W moderate colonic stool volume, cholecystectomy, mild diffuse biliary ductal dilation 12 mm On linzess  72 mcg PRN - Increase fiber/ water intake, decrease caffeine, increase activity level. - do bowel purge and start linzess  daily, refilled  Family history of colon cancer (mom at age 10) 07/18/2021 adequate prep unremarkable, normal TI internal hemorrhoids recall 5 years ( 2028)  Moderate intrahepatic biliary ductal dilation seen on coronary calcium score Status post cholecystectomy and on chronic opioid therapy 2019 CTAP W mild diffuse intrahepatic biliary ductal dilation dilated CBD 12 mm increase since 2012 most likely chronic postcholecystectomy effect RUQ US /2020 CBD 4.7 status post cholecystectomy unremarkable liver Previously elevated LFTs December  2020 most recent May 2024 unremarkable liver function Could be secondary to cholecystectomy and chronic opioid therapy - declines labs today  Chronic pain syndrome On oxycodone   COPD/allergies/asthma Not on oxygen, lungs CTAB follow up pulmonary/allergist  Patient Care Team: Fernand Tracey LABOR, MD as PCP - General (Family Medicine) Monetta Redell PARAS, MD as PCP - Cardiology (Cardiology)  HISTORY OF PRESENT ILLNESS: 53 y.o. female with a past medical history listed below presents for evaluation of dysphagia, GERD.   Last seen in the office 06/22/2021 by Dr. Charlanne for GERD, IBS with constipation, EOE and family history of colon cancer.  Discussed the use of AI scribe software for clinical note transcription with the patient, who gave verbal consent to proceed.  History of Present Illness   Lisa [name] is a 53 year old female with eosinophilic esophagitis who presents with worsening stomach pain and swallowing difficulties.  She has been experiencing significant stomach pain that has worsened over the past month and a half. The pain is located in the abdomen, occasionally radiating to the back, and is described as a 'real tight' feeling. Eating exacerbates the pain, and she experiences a sensation of food getting stuck in her throat, particularly with rice and potatoes. Despite taking Dexilant  and Protonix , her symptoms have not improved.  Her history of eosinophilic esophagitis includes a recent esophagogastroduodenoscopy (EGD) in 2023, during which furrows were seen and biopsies were negative. She underwent dilation during that procedure. Current symptoms include occasional painful swallowing, but no issues with liquids. No fevers, chills, weight loss, or changes in bowel habits, and she reports regular bowel movements without constipation.  She has been dealing with uncontrolled asthma and allergies for several months, which have not improved despite treatment. She was on prednisone  for asthma  a month ago but had to stop after four days due  to a reaction. She uses inhalers regularly and has been on antibiotics recently. No recent hospitalizations or need for oxygen therapy.  Her medication regimen includes diclofenac  for shoulder pain, which she has been taking for a month, and she occasionally uses Tylenol . She has not experienced any black or bloody stools.  She reports a history of low heart rate and blood pressure issues, with occasional heart rate dropping to the 40s, causing fatigue. She has been evaluated by a cardiologist who noted concerns about small vessel disease, although large vessels appeared normal.      She  reports that she has quit smoking. Her smoking use included cigarettes. She has a 15 pack-year smoking history. She has never used smokeless tobacco. She reports that she does not drink alcohol and does not use drugs.  RELEVANT GI HISTORY, IMAGING AND LABS: Results   LABS Hb: Normal (01/13/2024) WBC: Normal (01/13/2024) MCV: Normal (01/13/2024) PLT: Normal (01/13/2024)  DIAGNOSTIC EGD: Dilation performed, furrows observed, biopsies negative for eosinophilic esophagitis (2023)     10/08/2022 coronary calcium score 0 CBC    Component Value Date/Time   WBC 5.8 09/18/2022 0904   WBC 4.0 07/26/2022 1008   RBC 4.03 09/18/2022 0904   RBC 4.05 07/26/2022 1008   HGB 13.3 09/18/2022 0904   HCT 40.4 09/18/2022 0904   PLT 286 09/18/2022 0904   MCV 100 (H) 09/18/2022 0904   MCH 33.0 09/18/2022 0904   MCH 31.9 07/26/2022 1008   MCHC 32.9 09/18/2022 0904   MCHC 32.9 07/26/2022 1008   RDW 12.7 09/18/2022 0904   LYMPHSABS 1.1 09/18/2022 0904   MONOABS 0.6 04/21/2019 0407   EOSABS 0.0 09/18/2022 0904   BASOSABS 0.0 09/18/2022 0904   No results for input(s): HGB in the last 8760 hours.  CMP     Component Value Date/Time   NA 142 09/18/2022 0904   K 4.4 09/18/2022 0904   CL 106 09/18/2022 0904   CO2 24 09/18/2022 0904   GLUCOSE 103 (H) 09/18/2022 0904    GLUCOSE 109 (H) 07/26/2022 1008   BUN 15 09/18/2022 0904   CREATININE 0.73 09/18/2022 0904   CALCIUM 9.2 09/18/2022 0904   PROT 6.2 09/18/2022 0904   ALBUMIN 4.1 09/18/2022 0904   AST 19 09/18/2022 0904   ALT 30 09/18/2022 0904   ALKPHOS 71 09/18/2022 0904   BILITOT 0.4 09/18/2022 0904   GFRNONAA >60 07/26/2022 1008   GFRAA >60 04/21/2019 0407      Latest Ref Rng & Units 09/18/2022    9:04 AM 04/21/2019    4:07 AM 04/20/2019    4:07 AM  Hepatic Function  Total Protein 6.0 - 8.5 g/dL 6.2  6.4  6.6   Albumin 3.8 - 4.9 g/dL 4.1  3.5  3.5   AST 0 - 40 IU/L 19  83  64   ALT 0 - 32 IU/L 30  132  115   Alk Phosphatase 44 - 121 IU/L 71  284  261   Total Bilirubin 0.0 - 1.2 mg/dL 0.4  0.5  0.2   Bilirubin, Direct 0.00 - 0.40 mg/dL <9.89         Current Medications:    Current Outpatient Medications (Cardiovascular):    metoprolol  tartrate (LOPRESSOR ) 25 MG tablet, Take 1 tablet (25 mg total) by mouth 2 (two) times daily.   amLODipine (NORVASC) 2.5 MG tablet, Take 1 tablet (2.5 mg total) by mouth every evening.  Current Outpatient Medications (Respiratory):    montelukast (SINGULAIR) 10 MG  tablet, Take 10 mg by mouth daily.   TRELEGY ELLIPTA 100-62.5-25 MCG/ACT AEPB, Inhale 1 puff into the lungs daily.   albuterol  (VENTOLIN  HFA) 108 (90 Base) MCG/ACT inhaler, SMARTSIG:2 Puff(s) By Mouth Every 4-6 Hours PRN  Current Outpatient Medications (Analgesics):    diclofenac  (VOLTAREN ) 75 MG EC tablet, Take 75 mg by mouth 2 (two) times daily.   Fremanezumab -vfrm (AJOVY ) 225 MG/1.5ML SOAJ, Inject 225 mg into the skin every 30 (thirty) days.   HYDROcodone -acetaminophen  (NORCO) 10-325 MG tablet, Take 1 tablet by mouth every 6 (six) hours as needed.   Rimegepant Sulfate (NURTEC) 75 MG TBDP, Take 1 tablet (75 mg total) by mouth as needed.   rizatriptan  (MAXALT -MLT) 10 MG disintegrating tablet, Take 1 tablet (10 mg total) by mouth as needed for migraine. May repeat in 2 hours if  needed   Current Outpatient Medications (Other):    dexlansoprazole  (DEXILANT ) 60 MG capsule, TAKE 1 CAPSULE(60 MG) BY MOUTH DAILY   dicyclomine  (BENTYL ) 10 MG capsule, TAKE 1 CAPSULE(10 MG) BY MOUTH FOUR TIMES DAILY AS NEEDED FOR SPASMS   gabapentin  (NEURONTIN ) 600 MG tablet, Take 600 mg by mouth 3 (three) times daily.   linaclotide  (LINZESS ) 72 MCG capsule, Take 72 mcg by mouth daily before breakfast.   meclizine  (ANTIVERT ) 12.5 MG tablet, Can take one tablet by mouth every 6 hours if needed.   methocarbamol  (ROBAXIN ) 750 MG tablet, Take by mouth.   pantoprazole  (PROTONIX ) 40 MG tablet, TAKE 1 TABLET(40 MG) BY MOUTH TWICE DAILY   scopolamine  (TRANSDERM-SCOP) 1 MG/3DAYS, Place 1 patch onto the skin every three (3) days as needed (dizziness).   sucralfate (CARAFATE) 1 g tablet, Take 1 tablet (1 g total) by mouth 4 (four) times daily -  with meals and at bedtime for 10 days.   VESICARE 10 MG tablet, Take 10 mg by mouth as needed (sistitis).   ALPRAZolam  (XANAX ) 0.5 MG tablet, Take 1-2 tablets 45 minutes prior to the MRI study, take a third tablet if needed. Must have driver   Boric Acid Vaginal 600 MG SUPP,    botulinum toxin Type A  (BOTOX ) 200 units injection, Inject 155 Units into the muscle every 3 (three) months.   famotidine  (PEPCID ) 40 MG tablet, Take 1 tablet (40 mg total) by mouth daily. Please call [phone] to schedule an office visit for more refills   ondansetron  (ZOFRAN ) 8 MG tablet, Take 8 mg by mouth every 8 (eight) hours as needed for nausea or vomiting.  Medical History:  Past Medical History:  Diagnosis Date   Actinic keratosis of left cheek 01/17/2022   Acute respiratory disease due to COVID-19 virus 04/17/2019   Anemia    Anxiety    Arrhythmia 08/04/2013   Arthropathy of lumbar facet joint 12/28/2021   Bursitis 07/12/2015   Cancer of the skin, basal cell 06/28/2021   Cervical radiculopathy 06/04/2018   Chest discomfort 03/04/2017   Chronic back pain    Chronic  constipation    Chronic interstitial cystitis 11/12/2016   Constipation due to opioid therapy 03/21/2015   Coronary artery disease    DDD (degenerative disc disease), cervical 01/02/2019   MRI from February 01, 2017 showed mild degenerative changes.   DDD (degenerative disc disease), lumbar 01/02/2019   Status post fusion in 2009 and 2010   Depression    Disc disorder 06/09/2013   Disorder of forearm joint 06/09/2013   Disorder of sacrum 08/04/2013   Endometriosis    Eosinophilic esophagitis    Episodic mood disorder 10/11/2012  Failed back syndrome 02/03/2019   Formatting of this note might be different from the original.  Added automatically from request for surgery [id number]   FH: colon cancer    Former smoker 03/04/2017   GERD (gastroesophageal reflux disease)    Headache    Hiatal hernia    History of Clostridioides difficile infection 01/02/2019   History of endometriosis 01/02/2019   History of kidney stones    History of migraine 01/02/2019   IBS (irritable bowel syndrome)    Lumbar radiculopathy 08/04/2013   Major depressive disorder, single episode, unspecified 06/09/2013   Medication management 09/19/2021   Migraine 11/01/2020   Mohs defect of cheek 09/13/2021   Myofascial pain 06/04/2018   Osteoporosis 04/14/2022   Pneumonia    PONV (postoperative nausea and vomiting)    Post laminectomy syndrome 10/09/2013   Postmenopausal bleeding 09/17/2022   Primary osteoarthritis of both hands 01/02/2019   Clinical and radiographic findings are consistent with osteoarthritis.  All autoimmune work-up was negative.  Use of Voltaren  gel and natural anti-inflammatorie   S/P right knee arthroscopy 08/01/2022   SI (sacroiliac) pain 09/09/2015   Skin cancer    Squamous cell carcinoma in situ of skin of left cheek 06/28/2021   Squamous cell skin cancer    head   Allergies:  Allergies  Allergen Reactions   Amoxicillin Shortness Of Breath   Shellfish Allergy  Anaphylaxis     Throat closes    Azithromycin Other (See Comments)    C-diff   Metaxalone Itching   Tape     Blisters skin     Surgical History:  She  has a past surgical history that includes Cholecystectomy (2002); Placement of breast implants; Esophagogastroduodenoscopy (03/01/2017); Colonoscopy (08/17/2016); Lumbar fusion; laparoscopy; Knee arthroscopy with medial menisectomy (Right, 08/01/2022); Dilation and curettage, diagnostic / therapeutic; skin cancer removal  (2025); and Dilation and curettage of uterus (2025). Family History:  Her family history includes Alcoholism in her brother and father; Colon cancer in her mother; Heart attack in her maternal aunt and maternal uncle; Hypertension in her brother; Kidney cancer in her maternal uncle; Leukemia in her paternal aunt; Liver cancer in her maternal aunt; Lung cancer in her maternal uncle; Migraines in her maternal aunt; Pancreatic cancer in her maternal aunt; Rheumatic fever in her mother; Stomach cancer in her maternal aunt; Throat cancer in her maternal uncle.  REVIEW OF SYSTEMS  : All other systems reviewed and negative except where noted in the History of Present Illness.  PHYSICAL EXAM: BP 110/70   Pulse 64   Ht 5' 7 (1.702 m)   Wt 139 lb (63 kg)   BMI 21.77 kg/m  Physical Exam   VITALS: BP- 112/70 GENERAL APPEARANCE: Well nourished, in no apparent distress. HEENT: No cervical lymphadenopathy, unremarkable thyroid , sclerae anicteric, conjunctiva pink, throat slightly red, no yeast observed. RESPIRATORY: Respiratory effort normal, BS equal bilateral without rales, rhonchi, wheezing. CARDIO: RRR with no MRGs, peripheral pulses intact. ABDOMEN: Soft, non distended, active bowel sounds in all 4 quadrants, no tenderness to palpation, no rebound, no mass appreciated. RECTAL: Declines. MUSCULOSKELETAL: Full ROM, normal gait, without edema. SKIN: Dry, intact without rashes or lesions. No jaundice. NEURO: Alert, oriented, no focal  deficits. PSYCH: Cooperative, normal mood and affect.      Alan JONELLE Coombs, PA-C 8:42 AM

## 2024-02-12 ENCOUNTER — Ambulatory Visit: Admitting: Physician Assistant

## 2024-02-12 VITALS — BP 110/70 | HR 64 | Ht 67.0 in | Wt 139.0 lb

## 2024-02-12 DIAGNOSIS — K5903 Drug induced constipation: Secondary | ICD-10-CM | POA: Diagnosis not present

## 2024-02-12 DIAGNOSIS — K200 Eosinophilic esophagitis: Secondary | ICD-10-CM

## 2024-02-12 DIAGNOSIS — K589 Irritable bowel syndrome without diarrhea: Secondary | ICD-10-CM

## 2024-02-12 DIAGNOSIS — K219 Gastro-esophageal reflux disease without esophagitis: Secondary | ICD-10-CM

## 2024-02-12 DIAGNOSIS — K581 Irritable bowel syndrome with constipation: Secondary | ICD-10-CM | POA: Diagnosis not present

## 2024-02-12 DIAGNOSIS — R1310 Dysphagia, unspecified: Secondary | ICD-10-CM

## 2024-02-12 DIAGNOSIS — Z9049 Acquired absence of other specified parts of digestive tract: Secondary | ICD-10-CM | POA: Diagnosis not present

## 2024-02-12 MED ORDER — LINACLOTIDE 72 MCG PO CAPS: 72.0000 ug | ORAL_CAPSULE | Freq: Every day | ORAL | 1 refills | Status: DC

## 2024-02-12 MED ORDER — FAMOTIDINE 40 MG PO TABS: 40.0000 mg | ORAL_TABLET | Freq: Every day | ORAL | 0 refills | Status: DC

## 2024-02-12 MED ORDER — AMBULATORY NON FORMULARY MEDICATION: 0 refills | Status: DC

## 2024-02-12 MED ORDER — LINACLOTIDE 72 MCG PO CAPS: ORAL_CAPSULE | ORAL | 0 refills | Status: DC

## 2024-02-12 MED ORDER — SUCRALFATE 1 G PO TABS
1.0000 g | ORAL_TABLET | Freq: Three times a day (TID) | ORAL | 0 refills | Status: AC
Start: 2024-02-12 — End: 2024-02-22

## 2024-02-12 NOTE — Patient Instructions (Addendum)
 We have sent the following medications to your pharmacy for you to pick up at your convenience: Famotidine  40 mg, take 1 tablet daily.  Sucralfate 1 G, take 1 tablet 4 times a day.  Reflux Gourmet Rescue  It is an ALGINATE THERAPY which is the only intervention that works to safeguard the esophagus by creating a protective barrier that actually stops reflux from happening. -The general directions for use are as stated on the packaging: Take 1 teaspoon (5 ml), or more as needed or as directed by your physician, after meals and before bed. -These general directions address the most common times for reflux to occur, but our Rescue products may be taken anytime. Some individuals may take our product preemptively, when they know they will suffer from reflux, or as needed - when discomfort arises. (If taken around food, it should be consumed last.) -You do not have to take 1 teaspoon (5 ml) of the product. While one teaspoon (5ml) may be the perfect average amount to relieve reflux suffering in some, others may require more or less. You may adjust the amount of Mint Chocolate Rescue and Vanilla Caramel Rescue to the lowest amount necessary to meet your individual needs to improve your quality of life. -You may dilute the product if it is too viscous for you to consume. Keep in mind, however, that the thickness of the product was formulated to provide optimal coating and protection of your throat and esophagus. Though diluting the product is possible, it may reduce the protective function and/or length of action. -This can be used in conjunction with reflux medications and lifestyle changes.  100% ALL-NATURAL  Paraben FREE, glycerin FREE, & potassium FREE  Made entirely from all-natural ingredients considered safe for children and during pregnancy  No known side effects  All-natural flavor Gluten FREE  Allergen FREE  Vegan   Can find more information  here: NameSeizer.co.nz  What is EOE? Eosinophilic esophagitis is a chronic (long-lasting) condition where white blood cells called eosinophils build up in the lining of your esophagus (the tube that carries food from your mouth to your stomach). This build-up causes inflammation, which can make swallowing difficult or uncomfortable. What Causes EoE? The exact cause is not fully known, but it's believed to be an allergic or immune reaction, often triggered by: Certain foods (common: milk, eggs, wheat, soy, nuts, seafood) Environmental allergens (like pollen, dust mites, mold) Genetics (runs in some families)  Common Symptoms Trouble swallowing (especially dry or solid foods) Food getting stuck in your throat Chest pain or heartburn Abdominal pain Nausea or vomiting Poor appetite (especially in children) Slow growth or weight loss (in children)  How Is It Diagnosed? Diagnosis usually includes: Upper endoscopy - A thin tube with a camera is inserted through your mouth to look at your esophagus. Biopsy - Small tissue samples are taken and examined under a microscope. Allergy  testing - To identify possible food or environmental triggers.   Treatment Options Treatment focuses on reducing inflammation and preventing food from getting stuck:  1. Dietary Therapy Elimination Diet: Remove common trigger foods (usually 6-8 major allergens). Elemental Diet: Uses a special amino acid-based formula. Targeted Elimination: Based on allergy  testing or food history.  2. Medications Swallowed corticosteroids: Like fluticasone or budesonide to reduce inflammation (not the same as asthma inhalers; the medicine is swallowed, not inhaled). Acid reducers: Like proton pump inhibitors (PPIs) to help with reflux and inflammation.  3. Esophageal Dilation If your esophagus becomes narrowed (a stricture), a doctor may  stretch it to help with swallowing.  Long-Term  Management EoE is a chronic condition, but symptoms can be managed with: Ongoing treatment Follow-up endoscopies Avoiding trigger foods Working with a gastroenterologist, allergist, and dietitian  Tips for Living with EoE Chew food slowly and thoroughly Keep a food/symptom diary Read food labels carefully Follow your doctor's dietary plan Discuss any swallowing difficulties promptly  Dupixent (dupilumab) therapy approved for Eosinophilic esophagitis  (Should not receive a live vaccine, planning on becoming pregnant) WOULD NEED EGD FIRST  -300 mg weekly SQ injectable that is a biologic.  -In clinical trials to pick sent reduced eosinophils in the esophagus, and help decrease symptoms. -The most common side effects or injection site reactions, upper respiratory infections, cold sores and joint pains. -Please notify us  right away if you have an allergic reaction to Dupixent such as breathing problems, wheezing, swelling of your lips mouth face, tongue, hives, general ill feeling.  Please notify us  if you have trouble walking or moving your joints, very rare cases I need to have hospitalization. -Would suggest getting flu vaccinations yearly, do not get any live vaccinations.  I encourage you to go to sites below or sign up for Dupixent my way  -You will receive a welcome call from a nurse educator who will help share resources and tools.  You can get a copay card from online or if you enroll in dupixent my way.  1) dupixent.com  2) call 184 for Dupixent   Toileting tips to help with your constipation - Drink at least 64-80 ounces of water/liquid per day. - Establish a time to try to move your bowels every day.  For many people, this is after a cup of coffee or after a meal such as breakfast. - Sit all of the way back on the toilet keeping your back fairly straight and while sitting up, try to rest the tops of your forearms on your upper thighs.   - Raising your feet with a step  stool/squatty potty can be helpful to improve the angle that allows your stool to pass through the rectum. - Relax the rectum feeling it bulge toward the toilet water.  If you feel your rectum raising toward your body, you are contracting rather than relaxing. - Breathe in and slowly exhale. Belly breath by expanding your belly towards your belly button. Keep belly expanded as you gently direct pressure down and back to the anus.  A low pitched GRRR sound can assist with increasing intra-abdominal pressure.  (Can also trying to blow on a pinwheel and make it move, this helps with the same belly breathing) - Repeat 3-4 times. If unsuccessful, contract the pelvic floor to restore normal tone and get off the toilet.  Avoid excessive straining. - To reduce excessive wiping by teaching your anus to normally contract, place hands on outer aspect of knees and resist knee movement outward.  Hold 5-10 second then place hands just inside of knees and resist inward movement of knees.  Hold 5 seconds.  Repeat a few times each way.  Go to the ER if unable to pass gas, severe AB pain, unable to hold down food, any shortness of breath of chest pain.  Please do the following: Purchase a bottle of Miralax  over the counter as well as a box of 5 mg dulcolax tablets. Take 4 dulcolax tablets. Wait 1 hour. You will then drink 6-8 capfuls of Miralax  mixed in an adequate amount of water/juice/gatorade (you may choose which of these  liquids to drink) over the next 2-3 hours. You should expect results within 1 to 6 hours after completing the bowel purge. Go to the er if you have severe AB pain, can not pass gas or stool in over 12 hours, can not hold down any food.   Linzess  72 MCG *IBS-C patients may begin to experience relief from belly pain and overall abdominal symptoms (pain, discomfort, and bloating) in about 1 week,  with symptoms typically improving over 12 weeks.  Take at least 30 minutes before the first meal  of the day on an empty stomach You can have a loose stool if you eat a high-fat breakfast. Give it at least 7 days, may have more bowel movements during that time.   The diarrhea should go away and you should start having normal, complete, full bowel movements.  It may be helpful to start treatment when you can be near the comfort of your own bathroom, such as a weekend.  After you are out we can send in a prescription if you did well, there is a prescription card.   Thank you for trusting me with your gastrointestinal care!   Alan Coombs, PA-C    Gastroparesis Gastroparesis is a condition in which food takes longer than normal to empty from the stomach.  This condition is also known as delayed gastric emptying. It is usually a long-term (chronic) condition.  What are the signs or symptoms? Symptoms of this condition include: Feeling full after eating very little or a loss of appetite. Nausea, vomiting, or heartburn. Bloating of your abdomen. Inconsistent blood sugar (glucose) levels on blood tests. Unexplained weight loss. Acid from the stomach coming up into the esophagus (gastroesophageal reflux). Sudden tightening (spasm) of the stomach, which can be painful. Symptoms may come and go. Some people may not notice any symptoms.  What increases the risk? You are more likely to develop this condition if: You have certain disorders or diseases. These may include: An endocrine disorder. An eating disorder. Amyloidosis. Scleroderma. Parkinson's disease. Multiple sclerosis. Cancer or infection of the stomach or the vagus nerve. You have had surgery on your stomach or vagus nerve. You take certain medicines. You are female.  Things you can do: Please do small frequent meals like 4-6 meals a day.  Eat and drink liquids at separate times.  Avoid high fiber foods, cook your vegetables, avoid high fat food.  Suggest spreading protein throughout the day (greek yogurt,  glucerna, soft meat, milk, eggs) Choose soft foods that you can mash with a fork When you are more symptomatic, change to pureed foods foods and liquids.  Consider reading Living well with Gastroparesis by [facility] Check out this link to a diet online [email or web address]  _______________________________________________________  If your blood pressure at your visit was 140/90 or greater, please contact your primary care physician to follow up on this.  _______________________________________________________  If you are age 39 or older, your body mass index should be between 23-30. Your Body mass index is 21.77 kg/m. If this is out of the aforementioned range listed, please consider follow up with your Primary Care Provider.  If you are age 35 or younger, your body mass index should be between 19-25. Your Body mass index is 21.77 kg/m. If this is out of the aformentioned range listed, please consider follow up with your Primary Care Provider.   ________________________________________________________  The [facility] providers would like to encourage you to use MYCHART to communicate with providers for non-urgent requests  or questions.  Due to long hold times on the telephone, sending your provider a message by [facility] may be a faster and more efficient way to get a response.  Please allow 48 business hours for a response.  Please remember that this is for non-urgent requests.  _______________________________________________________  [facility] is using a team-based approach to care.  Your team is made up of your doctor and two to three APPS. Our APPS (Nurse Practitioners and Physician Assistants) work with your physician to ensure care continuity for you. They are fully qualified to address your health concerns and develop a treatment plan. They communicate directly with  your gastroenterologist to care for you. Seeing the Advanced Practice Practitioners on your physician's team can help you by facilitating care more promptly, often allowing for earlier appointments, access to diagnostic testing, procedures, and other specialty referrals.

## 2024-02-14 ENCOUNTER — Other Ambulatory Visit: Payer: Self-pay | Admitting: Neurology

## 2024-02-14 ENCOUNTER — Other Ambulatory Visit: Payer: Self-pay

## 2024-02-14 DIAGNOSIS — M7061 Trochanteric bursitis, right hip: Secondary | ICD-10-CM | POA: Diagnosis not present

## 2024-02-14 DIAGNOSIS — G43709 Chronic migraine without aura, not intractable, without status migrainosus: Secondary | ICD-10-CM

## 2024-02-17 NOTE — Telephone Encounter (Signed)
 Please send Botox  refills to [facility].

## 2024-02-18 ENCOUNTER — Other Ambulatory Visit: Payer: Self-pay

## 2024-02-19 ENCOUNTER — Other Ambulatory Visit: Payer: Self-pay

## 2024-02-19 ENCOUNTER — Other Ambulatory Visit: Payer: Self-pay | Admitting: Pharmacy Technician

## 2024-02-19 MED ORDER — DEXLANSOPRAZOLE 60 MG PO CPDR: 60.0000 mg | DELAYED_RELEASE_CAPSULE | Freq: Every day | ORAL | 2 refills | Status: DC

## 2024-02-19 MED ORDER — BOTOX 200 UNITS IJ SOLR
INTRAMUSCULAR | 2 refills | Status: AC
  Filled 2024-02-19: qty 1, fill #0
  Filled 2024-02-19: qty 1, 90d supply, fill #0
  Filled 2024-05-12 – 2024-05-18 (×2): qty 1, 90d supply, fill #1

## 2024-02-19 NOTE — Progress Notes (Signed)
 Specialty Pharmacy Refill Coordination Note  Lisa [name] is a 53 y.o. female assessed today regarding refills of clinic administered specialty medication(s) OnabotulinumtoxinA  (Botox )   Clinic requested Courier to Provider Office   Delivery date: 02/20/24   Verified address: [facility] [address]   Medication will be filled on 02/19/24.

## 2024-02-20 DIAGNOSIS — G8929 Other chronic pain: Secondary | ICD-10-CM | POA: Diagnosis not present

## 2024-02-20 DIAGNOSIS — M1711 Unilateral primary osteoarthritis, right knee: Secondary | ICD-10-CM | POA: Diagnosis not present

## 2024-02-20 DIAGNOSIS — M25512 Pain in left shoulder: Secondary | ICD-10-CM | POA: Diagnosis not present

## 2024-02-20 DIAGNOSIS — N952 Postmenopausal atrophic vaginitis: Secondary | ICD-10-CM | POA: Diagnosis not present

## 2024-02-21 ENCOUNTER — Other Ambulatory Visit: Payer: Self-pay | Admitting: Physician Assistant

## 2024-02-21 ENCOUNTER — Telehealth: Admitting: Neurology

## 2024-02-25 ENCOUNTER — Ambulatory Visit: Admitting: Neurology

## 2024-02-25 ENCOUNTER — Encounter: Payer: Self-pay | Admitting: Neurology

## 2024-02-25 VITALS — BP 122/83 | HR 80

## 2024-02-25 DIAGNOSIS — G43709 Chronic migraine without aura, not intractable, without status migrainosus: Secondary | ICD-10-CM

## 2024-02-25 MED ORDER — ONABOTULINUMTOXINA 200 UNITS IJ SOLR
155.0000 [IU] | Freq: Once | INTRAMUSCULAR | Status: AC
  Administered 2024-02-25: 155 [IU] via INTRAMUSCULAR

## 2024-02-25 NOTE — Progress Notes (Signed)
 Botox - 200 units x 1 vial Lot: [id number] Expiration: 04/2026 NDC: 9976-6078-97  Bacteriostatic 0.9% Sodium Chloride - 4 mL  Lot: [id number] Expiration: 03/06/2025 NDC: 9590-8033-97  Dx: H56.290 S/P  Witnessed by Sherrod Climes

## 2024-02-25 NOTE — Progress Notes (Signed)
   BOTOX  PROCEDURE NOTE FOR MIGRAINE HEADACHE  HISTORY: Lisa [name] is here for Botox . Last was 11/27/23 with me.  Migraines are much better, about 7 a month. They are less severe. Botox  has worn off at this point. Botox  has been amazing! Remains on Ajovy . Uses Nurtec and Maxalt  PRN. She had Botox  around her crows feet yesterday with plastic surgery.   Description of procedure:  The patient was placed in a sitting position. The standard protocol was used for Botox  as follows, with 5 units of Botox  injected at each site:  -Procerus muscle, DEFERRED   -Corrugator muscle, DEFERRED   -Frontalis muscle, bilateral injection, with 2 sites each side, medial injection was performed in the upper one third of the frontalis muscle, in the region vertical from the medial inferior edge of the superior orbital rim. The lateral injection was again in the upper one third of the forehead vertically above the lateral limbus of the cornea, 1.5 cm lateral to the medial injection site.  -Temporalis muscle injection, 4 sites, bilaterally. The first injection was 3 cm above the tragus of the ear, second injection site was 1.5 cm to 3 cm up from the first injection site in line with the tragus of the ear. The third injection site was 1.5-3 cm forward between the first 2 injection sites. The fourth injection site was 1.5 cm posterior to the second injection site.  -Occipitalis muscle injection, 3 sites, bilaterally. The first injection was done one half way between the occipital protuberance and the tip of the mastoid process behind the ear. The second injection site was done lateral and superior to the first, 1 fingerbreadth from the first injection. The third injection site was 1 fingerbreadth superiorly and medially from the first injection site.  -Cervical paraspinal muscle injection, 2 sites, bilateral, the first injection site was 1 cm from the midline of the cervical spine, 3 cm inferior to the lower border of  the occipital protuberance. The second injection site was 1.5 cm superiorly and laterally to the first injection site.  -Trapezius muscle injection was performed at 3 sites, bilaterally. The first injection site was in the upper trapezius muscle halfway between the inflection point of the neck, and the acromion. The second injection site was one half way between the acromion and the first injection site. The third injection was done between the first injection site and the inflection point of the neck.  A 200 unit bottle of Botox  was used, 140 units were injected, the rest of the Botox  was wasted. The patient tolerated the procedure well, there were no complications of the above procedure.  Botox  NDC 9976-6078-97 Lot number [id number] Expiration date 04/2026 SP  I did not inject the corrugator or procerus muscles due to receiving Botox  yesterday to that area.   Continue Ajovy  for migraine prevention.

## 2024-03-06 DIAGNOSIS — N958 Other specified menopausal and perimenopausal disorders: Secondary | ICD-10-CM | POA: Diagnosis not present

## 2024-03-06 DIAGNOSIS — M816 Localized osteoporosis [Lequesne]: Secondary | ICD-10-CM | POA: Diagnosis not present

## 2024-03-06 LAB — HM DEXA SCAN

## 2024-03-16 DIAGNOSIS — M533 Sacrococcygeal disorders, not elsewhere classified: Secondary | ICD-10-CM | POA: Diagnosis not present

## 2024-03-16 DIAGNOSIS — G8929 Other chronic pain: Secondary | ICD-10-CM | POA: Diagnosis not present

## 2024-03-16 DIAGNOSIS — Z79899 Other long term (current) drug therapy: Secondary | ICD-10-CM | POA: Diagnosis not present

## 2024-03-16 DIAGNOSIS — M47816 Spondylosis without myelopathy or radiculopathy, lumbar region: Secondary | ICD-10-CM | POA: Diagnosis not present

## 2024-03-16 DIAGNOSIS — M961 Postlaminectomy syndrome, not elsewhere classified: Secondary | ICD-10-CM | POA: Diagnosis not present

## 2024-03-26 ENCOUNTER — Ambulatory Visit: Admitting: Physician Assistant

## 2024-04-07 NOTE — Progress Notes (Unsigned)
 LILLETTE Ileana Collet, PhD, LAT, ATC acting as a scribe for Artist Lloyd, MD.  Lisa [name] is a 53 y.o. female who presents to [facility] today for osteoporosis management.  DEXA scan (date, T-score): 03/06/24: Spine= -1.3, L-FN= -2.1, R-FN= -2.9 Prior treatment: none History of Hip, Spine, or Wrist Fx: yes, Nov 2023 calcaneous, R femur,  March 2025 R tibia fx Heart disease or stroke: none Cancer: yes- basal/squamous cell carcinoma 2023, 2025 Patient has been on steroids off-and-on for the years due to asthma.  d Kidney Disease: IC Gastric/Peptic Ulcer: no Gastric bypass surgery: no Severe GERD: yes Hx of seizures: no Age at Menopause: late 30's Calcium intake: no, drinks milk Vitamin D intake: yes, but only occasionally Hormone replacement therapy: Premarin Smoking history: former smoker (smoked 18-yrs) Alcohol: 2 drinks/wk Exercise: walking Major dental work in past year: no Parents with hip/spine fracture: yes- father Height loss: unsure  Pertinent review of systems: No fevers or chills  Relevant historical information: Asthma.  History of systemic steroid use.   Exam:  BP 126/82   Pulse 71   Ht 5' 7 (1.702 m)   Wt 136 lb (61.7 kg)   SpO2 98%   BMI 21.30 kg/m  General: Well Developed, well nourished, and in no acute distress.   MSK: Normal motion and gait         Assessment and Plan: 53 y.o. female with osteoporosis due to chronic steroid use.  This does occur with a history of osteoporosis fracture.  T-score is -2.9 which with a history of osteoporosis fracture in her age of 50 does put her into a more severe category.  We discussed options.  Plan for anabolic agent.  Will prefer Tymlos for 1 year followed by stabilizing agent such as Reclast.  Will check basic labs today including metabolic panel vitamin D in preparation for Tymlos.  Additionally we talked about weightbearing exercise.  She does have some musculoskeletal  pain which is limiting her ability to do resistance training.  Refer to physical therapy in [place].   PDMP not reviewed this encounter. Orders Placed This Encounter  Procedures   Comprehensive metabolic panel with GFR    Osteoporis    Standing Status:   Future    Number of Occurrences:   1    Expiration Date:   04/08/2025   Magnesium     Therapeutic drug monitoring    Standing Status:   Future    Number of Occurrences:   1    Expiration Date:   04/08/2025   Phosphorus    Osteroporis    Standing Status:   Future    Number of Occurrences:   1    Expiration Date:   04/08/2025   VITAMIN D 25 Hydroxy (Vit-D Deficiency, Fractures)    Osteoporis    Standing Status:   Future    Number of Occurrences:   1    Expiration Date:   04/08/2025   Ambulatory referral to Physical Therapy    Referral Priority:   Routine    Referral Type:   Physical Medicine    Referral Reason:   Specialty Services Required    Requested Specialty:   Physical Therapy    Number of Visits Requested:   1   Meds ordered this encounter  Medications   Abaloparatide (TYMLOS) 3120 MCG/1.56ML SOPN    Sig: Inject 80 mcg into the skin daily.    Dispense:  1.57 mL    Refill:  11   Insulin Pen Needle (PEN NEEDLES) 31G X 5 MM MISC    Sig: 1 Needle by Does not apply route daily. Use with Tymlos    Dispense:  100 each    Refill:  3   sharps container    Sig: 1 each by Does not apply route as needed.    Dispense:  1 each    Refill:  3   Alcohol Swabs (WEBCOL ALCOHOL PREP MEDIUM) 70 % PADS    Sig: 1 each by Does not apply route daily.    Dispense:  100 each    Refill:  3     Discussed warning signs or symptoms. Please see discharge instructions. Patient expresses understanding.   The above documentation has been reviewed and is accurate and complete Artist Lloyd, M.D.

## 2024-04-08 ENCOUNTER — Other Ambulatory Visit: Payer: Self-pay

## 2024-04-08 ENCOUNTER — Ambulatory Visit: Admitting: Family Medicine

## 2024-04-08 ENCOUNTER — Other Ambulatory Visit (HOSPITAL_COMMUNITY): Payer: Self-pay

## 2024-04-08 VITALS — BP 126/82 | HR 71 | Ht 67.0 in | Wt 136.0 lb

## 2024-04-08 DIAGNOSIS — Z87310 Personal history of (healed) osteoporosis fracture: Secondary | ICD-10-CM | POA: Insufficient documentation

## 2024-04-08 DIAGNOSIS — M8580 Other specified disorders of bone density and structure, unspecified site: Secondary | ICD-10-CM | POA: Insufficient documentation

## 2024-04-08 DIAGNOSIS — G8929 Other chronic pain: Secondary | ICD-10-CM

## 2024-04-08 LAB — MAGNESIUM: Magnesium: 1.9 mg/dL (ref 1.5–2.5)

## 2024-04-08 LAB — COMPREHENSIVE METABOLIC PANEL WITH GFR
ALT: 32 U/L (ref 0–35)
AST: 26 U/L (ref 0–37)
Albumin: 4.3 g/dL (ref 3.5–5.2)
Alkaline Phosphatase: 42 U/L (ref 39–117)
BUN: 20 mg/dL (ref 6–23)
CO2: 28 meq/L (ref 19–32)
Calcium: 9.7 mg/dL (ref 8.4–10.5)
Chloride: 103 meq/L (ref 96–112)
Creatinine, Ser: 0.66 mg/dL (ref 0.40–1.20)
GFR: 99.56 mL/min (ref >60.00)
Glucose, Bld: 98 mg/dL (ref 70–99)
Potassium: 4 meq/L (ref 3.5–5.1)
Sodium: 139 meq/L (ref 135–145)
Total Bilirubin: 0.4 mg/dL (ref 0.2–1.2)
Total Protein: 6.6 g/dL (ref 6.0–8.3)

## 2024-04-08 LAB — VITAMIN D 25 HYDROXY (VIT D DEFICIENCY, FRACTURES): VITD: 33.19 ng/mL (ref 30.00–100.00)

## 2024-04-08 LAB — PHOSPHORUS: Phosphorus: 4 mg/dL (ref 2.3–4.6)

## 2024-04-08 MED ORDER — SHARPS CONTAINER MISC
1.0000 | 3 refills | Status: AC | PRN
  Filled 2024-04-08 (×2): qty 1, 30d supply, fill #0

## 2024-04-08 MED ORDER — WEBCOL ALCOHOL PREP MEDIUM 70 % PADS
1.0000 | MEDICATED_PAD | Freq: Every day | 3 refills | Status: AC
  Filled 2024-04-08: qty 100, 100d supply, fill #0

## 2024-04-08 MED ORDER — TYMLOS 3120 MCG/1.56ML ~~LOC~~ SOPN
80.0000 ug | PEN_INJECTOR | Freq: Every day | SUBCUTANEOUS | 11 refills | Status: AC
  Filled 2024-04-13: qty 1.57, fill #0
  Filled 2024-04-22: qty 1.56, 28d supply, fill #0
  Filled 2024-05-14: qty 1.56, 28d supply, fill #1

## 2024-04-08 MED ORDER — PEN NEEDLES 31G X 5 MM MISC
1.0000 | Freq: Every day | 3 refills | Status: AC
  Filled 2024-04-08 – 2024-04-22 (×2): qty 100, 90d supply, fill #0

## 2024-04-08 NOTE — Patient Instructions (Addendum)
 Thank you for coming in today.   Please get labs today before you leave   I've referred you to Physical Therapy.  Let us  know if you don't hear from them in one week.   Plan to start Tymlos    Keep me updates with how it goes

## 2024-04-09 ENCOUNTER — Ambulatory Visit: Payer: Self-pay | Admitting: Family Medicine

## 2024-04-09 ENCOUNTER — Other Ambulatory Visit: Payer: Self-pay

## 2024-04-09 ENCOUNTER — Other Ambulatory Visit (HOSPITAL_COMMUNITY): Payer: Self-pay

## 2024-04-09 NOTE — Progress Notes (Signed)
 Osteoporosis labs look okay.  Continue vitamin D and calcium that you are taking

## 2024-04-10 ENCOUNTER — Telehealth: Payer: Self-pay

## 2024-04-10 ENCOUNTER — Other Ambulatory Visit: Payer: Self-pay

## 2024-04-13 ENCOUNTER — Other Ambulatory Visit: Payer: Self-pay

## 2024-04-13 ENCOUNTER — Other Ambulatory Visit (HOSPITAL_COMMUNITY): Payer: Self-pay

## 2024-04-13 NOTE — Telephone Encounter (Signed)
 Pharmacy Patient Advocate Encounter  Received notification from [employer] that Prior Authorization for Tymlos  has been DENIED.      PA #/Case ID/Reference #: [id number]  Denial letter faxed to provider

## 2024-04-13 NOTE — Telephone Encounter (Signed)
 Pharmacy Patient Advocate Encounter   Received notification from Patient Pharmacy that prior authorization for Tymlos  is required/requested.   Insurance verification completed.   The patient is insured through [facility] [employer]/RX [employer].   Per test claim: PA required; PA submitted to above mentioned insurance via Latent Key/confirmation #/EOC [id number] Status is pending

## 2024-04-14 ENCOUNTER — Telehealth: Payer: Self-pay

## 2024-04-14 ENCOUNTER — Other Ambulatory Visit (HOSPITAL_COMMUNITY): Payer: Self-pay

## 2024-04-14 NOTE — Telephone Encounter (Signed)
 Denial letter placed on Dr. Virgilio desk to review and advised of preferred alternative to Tymlos .

## 2024-04-16 ENCOUNTER — Encounter (HOSPITAL_COMMUNITY): Payer: Self-pay

## 2024-04-17 ENCOUNTER — Other Ambulatory Visit (HOSPITAL_COMMUNITY): Payer: Self-pay

## 2024-04-17 ENCOUNTER — Other Ambulatory Visit: Payer: Self-pay

## 2024-04-17 NOTE — Telephone Encounter (Signed)
 We are going to look this over again.  She does have a history of fracture.  We may need to try alendronate and see if she can tolerate it.  Please let me know.

## 2024-04-20 NOTE — Telephone Encounter (Signed)
 Appeal faxed to [employer] and [employer] ( per directions from denial letter to [employer] fax number provided for appeal on denial letter) via FAX machine.

## 2024-04-21 ENCOUNTER — Encounter: Payer: Self-pay | Admitting: Neurology

## 2024-04-21 ENCOUNTER — Encounter: Payer: Self-pay | Admitting: Family Medicine

## 2024-04-22 ENCOUNTER — Other Ambulatory Visit: Payer: Self-pay

## 2024-04-22 ENCOUNTER — Other Ambulatory Visit (HOSPITAL_COMMUNITY): Payer: Self-pay

## 2024-04-22 ENCOUNTER — Other Ambulatory Visit: Payer: Self-pay | Admitting: Allergy and Immunology

## 2024-04-22 NOTE — Progress Notes (Signed)
 Pharmacy Patient Advocate Encounter  Insurance verification completed.   The patient is insured through [facility] [employer]/RX ADVANCE   Ran test claim for Tymlos. Co-pay is $0.  This test claim was processed through [facility]- copay amounts may vary at other pharmacies due to pharmacy/plan contracts, or as the patient moves through the different stages of their insurance plan.

## 2024-04-22 NOTE — Progress Notes (Signed)
 Specialty Pharmacy Initiation Note   Lisa [name] is a 53 y.o. female who will be followed by the specialty pharmacy service for RxSp Osteoporosis    Review of administration, indication, effectiveness, safety, potential side effects, storage/disposable, and missed dose instructions occurred today for patient's specialty medication(s) Abaloparatide  (Tymlos )     Patient/Caregiver did not have any additional questions or concerns.   Patient's therapy is appropriate to: Initiate    Goals Addressed             This Visit's Progress    Prevent or reduce bone loss       Patient is initiating therapy. Patient will maintain adherence and adhere to provider and/or lab appointments         Lisa [name] Specialty Pharmacist

## 2024-04-22 NOTE — Telephone Encounter (Signed)
 Appeal submitted by office and was approved..  Received notification from [facility] [employer] that Prior Authorization for Tymlos  has been APPROVED from 04/22/24 to 04/21/25   PA #/Case ID/Reference #: [id number]

## 2024-04-22 NOTE — Telephone Encounter (Signed)
 Walterine Riggs,  That's awesome! We are good to get her set up at any point. She has reached out inquiring about status of appeal.   I do not recall Dr. Joane mentioning that she would need injection teaching for Tymlos . If you would please confirm that when you speak with her. If she does want injection teaching, we will reach out to get her scheduled. We have samples in the office that we can use for the injection teaching, if needed.   Thank you!

## 2024-04-22 NOTE — Progress Notes (Signed)
 Specialty Pharmacy Initial Fill Coordination Note  Lisa [name] is a 53 y.o. female contacted today regarding initial fill of specialty medication(s) Abaloparatide  (Tymlos )   Patient requested [facility] at [facility] [place] date: 04/23/24   Medication will be filled on: 04/23/24    Patient is aware of $0 copayment.

## 2024-04-23 ENCOUNTER — Other Ambulatory Visit: Payer: Self-pay

## 2024-04-23 NOTE — Telephone Encounter (Signed)
 Noted, thank you!

## 2024-04-25 ENCOUNTER — Other Ambulatory Visit: Payer: Self-pay | Admitting: Physician Assistant

## 2024-04-27 MED ORDER — NURTEC 75 MG PO TBDP: 75.0000 mg | ORAL_TABLET | ORAL | 11 refills | Status: DC | PRN

## 2024-04-27 NOTE — Telephone Encounter (Signed)
 Will follow up with pt in 1 month to see how she is tolerating Tymlos .

## 2024-04-27 NOTE — Addendum Note (Signed)
 Addended by: ONEITA NEVELYN BRAVO on: 04/27/2024 07:52 AM   Modules accepted: Orders

## 2024-05-12 ENCOUNTER — Telehealth: Payer: Self-pay | Admitting: Neurology

## 2024-05-12 ENCOUNTER — Other Ambulatory Visit: Payer: Self-pay

## 2024-05-12 NOTE — Telephone Encounter (Signed)
 Completed PA request via CMM, status is pending. Key: [id number]

## 2024-05-13 NOTE — Telephone Encounter (Signed)
 I relayed information that was needed.

## 2024-05-13 NOTE — Telephone Encounter (Signed)
 [facility] [employer] called to request to speak to nurse about Pt medication botulinum toxin Type A  (BOTOX ) 200 units injection  PA,Rep did not  go into detail on  specific she needed .   Callback number is  [phone] option 2

## 2024-05-14 ENCOUNTER — Other Ambulatory Visit: Payer: Self-pay

## 2024-05-14 NOTE — Progress Notes (Signed)
 Specialty Pharmacy Refill Coordination Note  Lisa [name] is a 54 y.o. female contacted today regarding refills of specialty medication(s) Abaloparatide  (Tymlos )   Patient requested Delivery   Delivery date: 05/22/24   Verified address: [address], [place], [address]   Medication will be filled on: 05/21/24

## 2024-05-18 ENCOUNTER — Other Ambulatory Visit: Payer: Self-pay | Admitting: Physician Assistant

## 2024-05-18 ENCOUNTER — Other Ambulatory Visit: Payer: Self-pay

## 2024-05-18 NOTE — Telephone Encounter (Signed)
 Auth#: [id number] (05/14/24-05/06/25)

## 2024-05-18 NOTE — Progress Notes (Signed)
 Specialty Pharmacy Refill Coordination Note  Lisa [name] is a 54 y.o. female assessed today regarding refills of clinic administered specialty medication(s) OnabotulinumtoxinA  (Botox )   Clinic requested Courier to Provider Office   Delivery date: 05/20/24   Verified address: [facility], [address] [place], [address]   Medication will be filled on: 05/19/24   Appointment 05/21/23.

## 2024-05-19 ENCOUNTER — Other Ambulatory Visit: Payer: Self-pay

## 2024-05-19 MED ORDER — NURTEC 75 MG PO TBDP: 1.0000 | ORAL_TABLET | Freq: Every day | ORAL | 11 refills | Status: AC | PRN

## 2024-05-20 ENCOUNTER — Ambulatory Visit: Admitting: Neurology

## 2024-05-20 VITALS — BP 122/79

## 2024-05-20 DIAGNOSIS — G43709 Chronic migraine without aura, not intractable, without status migrainosus: Secondary | ICD-10-CM

## 2024-05-20 MED ORDER — ONABOTULINUMTOXINA 200 UNITS IJ SOLR
155.0000 [IU] | Freq: Once | INTRAMUSCULAR | Status: AC
  Administered 2024-05-20: 155 [IU] via INTRAMUSCULAR

## 2024-05-20 NOTE — Progress Notes (Signed)
" ° ° °  BOTOX  PROCEDURE NOTE FOR MIGRAINE HEADACHE   HISTORY: Lisa [name] is here for Botox . Last was 02/25/24 with me. This is her 4th Botox  cycle. Uses Maxalt  and Nurtec. Remains on Ajovy . With Botox , has about 12 migraines a month, 8 are severe. Before Botox , was near daily migraine. Mentions MRI brain ordered, was not completed.    Description of procedure:  The patient was placed in a sitting position. The standard protocol was used for Botox  as follows, with 5 units of Botox  injected at each site:   -Procerus muscle, midline injection  -Corrugator muscle, bilateral injection  -Frontalis muscle, bilateral injection, with 2 sites each side, medial injection was performed in the upper one third of the frontalis muscle, in the region vertical from the medial inferior edge of the superior orbital rim. The lateral injection was again in the upper one third of the forehead vertically above the lateral limbus of the cornea, 1.5 cm lateral to the medial injection site.  -Temporalis muscle injection, 4 sites, bilaterally. The first injection was 3 cm above the tragus of the ear, second injection site was 1.5 cm to 3 cm up from the first injection site in line with the tragus of the ear. The third injection site was 1.5-3 cm forward between the first 2 injection sites. The fourth injection site was 1.5 cm posterior to the second injection site.  -Occipitalis muscle injection, 3 sites, bilaterally. The first injection was done one half way between the occipital protuberance and the tip of the mastoid process behind the ear. The second injection site was done lateral and superior to the first, 1 fingerbreadth from the first injection. The third injection site was 1 fingerbreadth superiorly and medially from the first injection site.  -Cervical paraspinal muscle injection, 2 sites, bilateral, the first injection site was 1 cm from the midline of the cervical spine, 3 cm inferior to the lower border of  the occipital protuberance. The second injection site was 1.5 cm superiorly and laterally to the first injection site.  -Trapezius muscle injection was performed at 3 sites, bilaterally. The first injection site was in the upper trapezius muscle halfway between the inflection point of the neck, and the acromion. The second injection site was one half way between the acromion and the first injection site. The third injection was done between the first injection site and the inflection point of the neck.   A 200 unit bottle of Botox  was used, 155 units were injected, the rest of the Botox  was wasted. The patient tolerated the procedure well, there were no complications of the above procedure.  Botox  NDC 9976-6078-97 Lot number [id number] Expiration date 07/06/2026 SP  Order MRI Brain, was ordered previously but not done. Continue Ajovy . Uses Maxalt /Nurtec PRN. Has not had any cosmetic Botox  lately.    "

## 2024-05-20 NOTE — Progress Notes (Signed)
 Botox - 200 units x 1 vial Lot: [id number] Expiration: 07/2026 NDC: 9976-6078-97  Bacteriostatic 0.9% Sodium Chloride -4 mL  Lot: [id number] Expiration: MAR-31-2027 NDC: 9590-8033-97  Dx: H56.290 S/P Witnessed by: Bonna Dustman, CMA

## 2024-05-21 ENCOUNTER — Telehealth: Payer: Self-pay | Admitting: Neurology

## 2024-05-21 ENCOUNTER — Other Ambulatory Visit: Payer: Self-pay

## 2024-05-21 NOTE — Telephone Encounter (Signed)
 no auth required sent to [facility] [phone]

## 2024-06-11 ENCOUNTER — Other Ambulatory Visit (HOSPITAL_COMMUNITY): Payer: Self-pay

## 2024-06-11 NOTE — Telephone Encounter (Signed)
 F/U visit due 04/2025 for Osteoporosis.

## 2024-08-26 ENCOUNTER — Ambulatory Visit: Admitting: Neurology
